# Patient Record
Sex: Female | Born: 1945
Health system: Southern US, Community
[De-identification: ages and names within clinical notes are randomized; demographics above are authoritative.]

## PROBLEM LIST (undated history)

## (undated) DIAGNOSIS — Z9109 Other allergy status, other than to drugs and biological substances: Secondary | ICD-10-CM

## (undated) DIAGNOSIS — K602 Anal fissure, unspecified: Secondary | ICD-10-CM

## (undated) DIAGNOSIS — M4850XA Collapsed vertebra, not elsewhere classified, site unspecified, initial encounter for fracture: Secondary | ICD-10-CM

## (undated) DIAGNOSIS — T7840XA Allergy, unspecified, initial encounter: Secondary | ICD-10-CM

## (undated) DIAGNOSIS — K5790 Diverticulosis of intestine, part unspecified, without perforation or abscess without bleeding: Secondary | ICD-10-CM

## (undated) DIAGNOSIS — K589 Irritable bowel syndrome without diarrhea: Secondary | ICD-10-CM

## (undated) DIAGNOSIS — C4490 Unspecified malignant neoplasm of skin, unspecified: Secondary | ICD-10-CM

## (undated) DIAGNOSIS — F32A Depression, unspecified: Secondary | ICD-10-CM

## (undated) DIAGNOSIS — N840 Polyp of corpus uteri: Secondary | ICD-10-CM

## (undated) DIAGNOSIS — F329 Major depressive disorder, single episode, unspecified: Secondary | ICD-10-CM

## (undated) HISTORY — PX: SKIN CANCER EXCISION: SHX779

## (undated) HISTORY — DX: Major depressive disorder, single episode, unspecified: F32.9

## (undated) HISTORY — DX: Polyp of corpus uteri: N84.0

## (undated) HISTORY — PX: HYSTEROSCOPY: SHX211

## (undated) HISTORY — PX: DILATION AND CURETTAGE OF UTERUS: SHX78

## (undated) HISTORY — PX: HEMORRHOID SURGERY: SHX153

## (undated) HISTORY — DX: Allergy, unspecified, initial encounter: T78.40XA

## (undated) HISTORY — DX: Depression, unspecified: F32.A

## (undated) HISTORY — DX: Anal fissure, unspecified: K60.2

## (undated) HISTORY — DX: Collapsed vertebra, not elsewhere classified, site unspecified, initial encounter for fracture: M48.50XA

## (undated) HISTORY — DX: Other allergy status, other than to drugs and biological substances: Z91.09

## (undated) HISTORY — PX: OTHER SURGICAL HISTORY: SHX169

## (undated) HISTORY — PX: MOUTH SURGERY: SHX715

## (undated) HISTORY — DX: Diverticulosis of intestine, part unspecified, without perforation or abscess without bleeding: K57.90

## (undated) HISTORY — DX: Irritable bowel syndrome, unspecified: K58.9

## (undated) HISTORY — PX: BACK SURGERY: SHX140

## (undated) HISTORY — PX: FRACTURE SURGERY: SHX138

## (undated) HISTORY — DX: Unspecified malignant neoplasm of skin, unspecified: C44.90

---

## 1997-11-09 ENCOUNTER — Other Ambulatory Visit: Admission: RE | Admit: 1997-11-09 | Discharge: 1997-11-09 | Payer: Self-pay | Admitting: Obstetrics and Gynecology

## 1998-01-10 ENCOUNTER — Emergency Department (HOSPITAL_COMMUNITY): Admission: EM | Admit: 1998-01-10 | Discharge: 1998-01-10 | Payer: Self-pay | Admitting: Emergency Medicine

## 1998-06-28 ENCOUNTER — Ambulatory Visit (HOSPITAL_COMMUNITY): Admission: RE | Admit: 1998-06-28 | Discharge: 1998-06-28 | Payer: Self-pay | Admitting: Obstetrics and Gynecology

## 1998-11-17 ENCOUNTER — Other Ambulatory Visit: Admission: RE | Admit: 1998-11-17 | Discharge: 1998-11-17 | Payer: Self-pay | Admitting: Obstetrics and Gynecology

## 1999-03-30 ENCOUNTER — Other Ambulatory Visit: Admission: RE | Admit: 1999-03-30 | Discharge: 1999-03-30 | Payer: Self-pay | Admitting: Obstetrics and Gynecology

## 2000-03-29 ENCOUNTER — Other Ambulatory Visit: Admission: RE | Admit: 2000-03-29 | Discharge: 2000-03-29 | Payer: Self-pay | Admitting: Obstetrics and Gynecology

## 2000-06-14 ENCOUNTER — Ambulatory Visit (HOSPITAL_COMMUNITY): Admission: RE | Admit: 2000-06-14 | Discharge: 2000-06-14 | Payer: Self-pay | Admitting: Family Medicine

## 2000-08-22 ENCOUNTER — Encounter (INDEPENDENT_AMBULATORY_CARE_PROVIDER_SITE_OTHER): Payer: Self-pay

## 2000-08-22 ENCOUNTER — Other Ambulatory Visit: Admission: RE | Admit: 2000-08-22 | Discharge: 2000-08-22 | Payer: Self-pay | Admitting: Obstetrics and Gynecology

## 2001-01-11 ENCOUNTER — Encounter (INDEPENDENT_AMBULATORY_CARE_PROVIDER_SITE_OTHER): Payer: Self-pay | Admitting: Specialist

## 2001-01-11 ENCOUNTER — Other Ambulatory Visit: Admission: RE | Admit: 2001-01-11 | Discharge: 2001-01-11 | Payer: Self-pay | Admitting: Obstetrics and Gynecology

## 2001-02-02 ENCOUNTER — Inpatient Hospital Stay (HOSPITAL_COMMUNITY): Admission: AD | Admit: 2001-02-02 | Discharge: 2001-02-02 | Payer: Self-pay | Admitting: *Deleted

## 2001-06-03 ENCOUNTER — Other Ambulatory Visit: Admission: RE | Admit: 2001-06-03 | Discharge: 2001-06-03 | Payer: Self-pay | Admitting: Obstetrics and Gynecology

## 2001-07-12 ENCOUNTER — Encounter: Payer: Self-pay | Admitting: Gastroenterology

## 2001-11-04 ENCOUNTER — Encounter: Admission: RE | Admit: 2001-11-04 | Discharge: 2001-11-04 | Payer: Self-pay | Admitting: Family Medicine

## 2001-11-04 ENCOUNTER — Encounter: Payer: Self-pay | Admitting: Family Medicine

## 2002-09-23 ENCOUNTER — Other Ambulatory Visit: Admission: RE | Admit: 2002-09-23 | Discharge: 2002-09-23 | Payer: Self-pay | Admitting: Obstetrics and Gynecology

## 2003-12-14 ENCOUNTER — Other Ambulatory Visit: Admission: RE | Admit: 2003-12-14 | Discharge: 2003-12-14 | Payer: Self-pay | Admitting: Obstetrics and Gynecology

## 2004-12-16 ENCOUNTER — Other Ambulatory Visit: Admission: RE | Admit: 2004-12-16 | Discharge: 2004-12-16 | Payer: Self-pay | Admitting: Obstetrics and Gynecology

## 2006-01-05 ENCOUNTER — Other Ambulatory Visit: Admission: RE | Admit: 2006-01-05 | Discharge: 2006-01-05 | Payer: Self-pay | Admitting: Obstetrics and Gynecology

## 2007-04-02 ENCOUNTER — Other Ambulatory Visit: Admission: RE | Admit: 2007-04-02 | Discharge: 2007-04-02 | Payer: Self-pay | Admitting: Obstetrics and Gynecology

## 2008-02-11 ENCOUNTER — Telehealth: Payer: Self-pay | Admitting: Gastroenterology

## 2008-02-21 ENCOUNTER — Telehealth: Payer: Self-pay | Admitting: Gastroenterology

## 2008-02-28 ENCOUNTER — Ambulatory Visit: Payer: Self-pay | Admitting: Gastroenterology

## 2008-02-28 DIAGNOSIS — K589 Irritable bowel syndrome without diarrhea: Secondary | ICD-10-CM | POA: Insufficient documentation

## 2008-02-28 DIAGNOSIS — K649 Unspecified hemorrhoids: Secondary | ICD-10-CM

## 2008-02-28 DIAGNOSIS — F605 Obsessive-compulsive personality disorder: Secondary | ICD-10-CM

## 2008-02-28 DIAGNOSIS — J45909 Unspecified asthma, uncomplicated: Secondary | ICD-10-CM

## 2008-02-28 DIAGNOSIS — N3020 Other chronic cystitis without hematuria: Secondary | ICD-10-CM

## 2008-02-28 DIAGNOSIS — I10 Essential (primary) hypertension: Secondary | ICD-10-CM

## 2008-03-03 ENCOUNTER — Telehealth: Payer: Self-pay | Admitting: Gastroenterology

## 2008-04-03 ENCOUNTER — Telehealth: Payer: Self-pay | Admitting: Gastroenterology

## 2008-04-07 ENCOUNTER — Ambulatory Visit: Payer: Self-pay | Admitting: Gastroenterology

## 2008-04-07 DIAGNOSIS — K6289 Other specified diseases of anus and rectum: Secondary | ICD-10-CM

## 2008-04-29 ENCOUNTER — Ambulatory Visit: Payer: Self-pay | Admitting: Gynecology

## 2008-05-05 ENCOUNTER — Ambulatory Visit: Payer: Self-pay | Admitting: Obstetrics and Gynecology

## 2008-05-26 ENCOUNTER — Ambulatory Visit: Payer: Self-pay | Admitting: Gastroenterology

## 2008-05-26 DIAGNOSIS — K602 Anal fissure, unspecified: Secondary | ICD-10-CM | POA: Insufficient documentation

## 2008-08-03 ENCOUNTER — Ambulatory Visit: Payer: Self-pay | Admitting: Obstetrics and Gynecology

## 2008-08-03 ENCOUNTER — Encounter: Payer: Self-pay | Admitting: Obstetrics and Gynecology

## 2008-08-03 ENCOUNTER — Other Ambulatory Visit: Admission: RE | Admit: 2008-08-03 | Discharge: 2008-08-03 | Payer: Self-pay | Admitting: Obstetrics and Gynecology

## 2008-08-25 ENCOUNTER — Ambulatory Visit: Payer: Self-pay | Admitting: Gastroenterology

## 2008-08-27 ENCOUNTER — Encounter: Payer: Self-pay | Admitting: Gastroenterology

## 2008-08-31 ENCOUNTER — Telehealth: Payer: Self-pay | Admitting: Gastroenterology

## 2008-09-02 ENCOUNTER — Telehealth: Payer: Self-pay | Admitting: Gastroenterology

## 2008-09-07 ENCOUNTER — Ambulatory Visit: Payer: Self-pay | Admitting: Gastroenterology

## 2008-09-07 ENCOUNTER — Encounter: Payer: Self-pay | Admitting: Gastroenterology

## 2008-09-08 ENCOUNTER — Encounter: Payer: Self-pay | Admitting: Gastroenterology

## 2008-09-08 ENCOUNTER — Telehealth: Payer: Self-pay | Admitting: Gastroenterology

## 2008-09-09 ENCOUNTER — Ambulatory Visit: Payer: Self-pay | Admitting: Gastroenterology

## 2008-09-09 LAB — CONVERTED CEMR LAB: Tissue Transglutaminase Ab, IgA: 0.1 units (ref <7)

## 2008-09-14 ENCOUNTER — Telehealth: Payer: Self-pay | Admitting: Gastroenterology

## 2008-10-27 ENCOUNTER — Ambulatory Visit: Payer: Self-pay | Admitting: Obstetrics and Gynecology

## 2009-02-04 ENCOUNTER — Ambulatory Visit: Payer: Self-pay | Admitting: Women's Health

## 2009-02-25 ENCOUNTER — Ambulatory Visit (HOSPITAL_BASED_OUTPATIENT_CLINIC_OR_DEPARTMENT_OTHER): Admission: RE | Admit: 2009-02-25 | Discharge: 2009-02-25 | Payer: Self-pay | Admitting: Surgery

## 2009-02-25 ENCOUNTER — Encounter (INDEPENDENT_AMBULATORY_CARE_PROVIDER_SITE_OTHER): Payer: Self-pay | Admitting: Surgery

## 2009-06-16 ENCOUNTER — Encounter: Admission: RE | Admit: 2009-06-16 | Discharge: 2009-06-16 | Payer: Self-pay | Admitting: Family Medicine

## 2009-06-17 ENCOUNTER — Encounter: Payer: Self-pay | Admitting: Cardiovascular Disease

## 2009-06-17 ENCOUNTER — Ambulatory Visit: Payer: Self-pay

## 2009-06-17 ENCOUNTER — Ambulatory Visit: Payer: Self-pay | Admitting: Internal Medicine

## 2009-06-17 ENCOUNTER — Ambulatory Visit: Payer: Self-pay | Admitting: Cardiovascular Disease

## 2009-06-17 ENCOUNTER — Ambulatory Visit (HOSPITAL_COMMUNITY): Admission: RE | Admit: 2009-06-17 | Discharge: 2009-06-17 | Payer: Self-pay | Admitting: Cardiovascular Disease

## 2009-06-17 DIAGNOSIS — R000 Tachycardia, unspecified: Secondary | ICD-10-CM

## 2009-06-21 ENCOUNTER — Telehealth: Payer: Self-pay | Admitting: Cardiovascular Disease

## 2009-06-21 ENCOUNTER — Telehealth (INDEPENDENT_AMBULATORY_CARE_PROVIDER_SITE_OTHER): Payer: Self-pay | Admitting: *Deleted

## 2009-06-23 ENCOUNTER — Telehealth: Payer: Self-pay | Admitting: Cardiovascular Disease

## 2009-07-19 ENCOUNTER — Encounter (INDEPENDENT_AMBULATORY_CARE_PROVIDER_SITE_OTHER): Payer: Self-pay | Admitting: Orthopedic Surgery

## 2009-07-19 ENCOUNTER — Inpatient Hospital Stay (HOSPITAL_COMMUNITY): Admission: RE | Admit: 2009-07-19 | Discharge: 2009-07-19 | Payer: Self-pay | Admitting: *Deleted

## 2009-07-28 ENCOUNTER — Ambulatory Visit: Payer: Self-pay | Admitting: Cardiovascular Disease

## 2009-10-05 ENCOUNTER — Other Ambulatory Visit: Admission: RE | Admit: 2009-10-05 | Discharge: 2009-10-05 | Payer: Self-pay | Admitting: Obstetrics and Gynecology

## 2009-10-05 ENCOUNTER — Ambulatory Visit: Payer: Self-pay | Admitting: Obstetrics and Gynecology

## 2009-11-16 ENCOUNTER — Ambulatory Visit: Payer: Self-pay | Admitting: Obstetrics and Gynecology

## 2009-11-24 ENCOUNTER — Ambulatory Visit: Payer: Self-pay | Admitting: Obstetrics and Gynecology

## 2010-02-28 ENCOUNTER — Ambulatory Visit: Payer: Self-pay | Admitting: Obstetrics and Gynecology

## 2010-08-09 NOTE — Assessment & Plan Note (Signed)
Summary: per check out/sf   Primary Provider:  Hulan Saas, MD   History of Present Illness: Ruth Walker is seen today at the request of [facility].  She has a severe anxiety disorder.  She fell recently needs a kyphoplasty.  She had significant tachycardia on her ECG and was referred for preop clearance  She was not anemic and they did draw a TSH althiought the notes did not indicate a final result.  She indicates that she always "freaks" out when she sees the doctor.  She denies SSCP, palpitations, syncope, diaphoresis.  There is no history of DCM or congenital heart issues.  She denies drug and alcohol use.  Her echo was reviewed and was normal.  I reviewed her holter monitor and it was also benign dated 06/17/2009.  Her heart rate had nomrla circadian variation.  Low was 59, high 101 and average Hr 74 bpm.  this does indicate that her tachycardia is physiologic and likely related to anxiety and not underlying pathology.  She is cleared for any back surgery and does not need any cardiac testing.    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 One Tablet By Mouth Every Day 2)  Hydrochlorothiazide 25 Mg Tabs (Hydrochlorothiazide) .Marland Kitchen.. 1 Tablet By Mouth Every Day 3)  Miralax  Powd (Polyethylene Glycol 3350) .... Mix 17grams in 8 Oz of Water At Bedtime 4)  Benefiber Drink Mix  Powd (Wheat Dextrin) .... 2 Teaspoons in The Morning 5)  Actonel  (Risedronate Sodium) .... Monthly 6)  Calcium 1200 1200-1000 Mg-Unit Chew (Calcium Carbonate-Vit  D-Min) .Marland Kitchen.. 1 Tab By Mouth Once Daily 7)  Vitamin D (Ergocalciferol) 50000 Unit Caps (Ergocalciferol) .... 2 Times A Month 8)  Restasis 0.05 % Emul (Cyclosporine) .... Daily 9)  Trimethoprim 100 Mg Tabs (Trimethoprim) .Marland Kitchen.. 1 Tab By Mouth Once Daily 10)  Hydrocodone-Acetaminophen 5-500 Mg Tabs (Hydrocodone-Acetaminophen) .Marland Kitchen.. 1 Tab By Mouth At Bedtime 11)  Tylenol Extra Strength 500 Mg Tabs (Acetaminophen) .... As Needed 12)  Zithromax 1 Gm Pack (Azithromycin) .... As Directed 13)  Metoprolol Tartrate 25 Mg Tabs (Metoprolol Tartrate) .... Take One Tablet By Mouth Twice A Day 14)  Aspirin 81 Mg Tbec (Aspirin) .... Take One Tablet By Mouth Daily 15)  Allergy Shot .... As Directed  Allergies (verified): 1)  ! Sulfa 2)  ! * Sellf 3)  ! Keflex  Past History:  Past Medical History: Last updated: 02/28/2008 Current Problems:  HEMORRHOIDS (ICD-455.6) IRRITABLE BOWEL SYNDROME (ICD-564.1)  Past Surgical History: Last updated: 02/28/2008 C-Section hemorrhiod surgery  Family History: Last updated: 02/28/2008 Family History of Diabetes: mother No FH of Colon Cancer:  Social History: Last updated: 04/07/2008 Occupation: retired Patient has never smoked.  Alcohol Use - no Illicit Drug Use - no Patient gets regular exercise.  Review of Systems       Denies fever, malais, weight loss, blurry vision, decreased visual acuity, cough, sputum, SOB, hemoptysis, pleuritic pain, palpitaitons, heartburn, abdominal pain, melena, lower extremity edema, claudication, or rash.   Vital Signs:  Patient profile:   65 year old female Height:  67 inches Weight:      129 pounds BMI:     20.28 Pulse rate:   77 / minute Resp:     12 per minute BP sitting:   126 / 74  (left arm)  Vitals Entered By: Kem Parkinson (July 28, 2009 2:04 PM)  Physical Exam  General:  Affect appropriate Healthy:  appears stated age HEENT: normal Neck supple with no adenopathy JVP normal no bruits no  thyromegaly Lungs clear with no wheezing and good diaphragmatic motion Heart:  S1/S2 no murmur,rub, gallop or click PMI normal Abdomen: benighn, BS positve, no tenderness, no AAA no bruit.  No HSM or HJR Distal pulses intact with no bruits No edema Neuro non-focal Skin warm and dry Bilateral scars on back of neck form recent surgery   Impression & Recommendations:  Problem # 1:  PREOPERATIVE EXAMINATION (ICD-V72.84) No complications from surgery.  Would d/c norvasc and continue BB as this helps with the anxiety and its  smoatic manifestations  Problem # 2:  TACHYCARDIA (ICD-785.0) Reactive from anxiety.  Holter with normal variation and echo with normal LV and RV  Problem # 3:  HYPERTENSION (ICD-401.9) Well controlled.  Don't think she needs 3 drug Rx.  Would D/C norvasc.  She will discuss with Dr Faustino Congress Her updated medication list for this problem includes:    Norvasc 5 Mg Tabs (Amlodipine besylate) ..... One tablet by mouth every day    Hydrochlorothiazide 25 Mg Tabs (Hydrochlorothiazide) .Marland Kitchen... 1 tablet by mouth every day    Metoprolol Tartrate 25 Mg Tabs (Metoprolol tartrate) .Marland Kitchen... Take one tablet by mouth twice a day    Aspirin 81 Mg Tbec (Aspirin) .Marland Kitchen... Take one tablet by mouth daily  Patient Instructions: 1)  Your physician recommends that you schedule a follow-up appointment as needed 2)  Your physician recommends that you continue on your current medications as directed. Please refer to the Current Medication list given to you today. 3)  You have been diagnosed with palpitations. Palpitations are described as a gradual or sudden awareness of the beating of your heart. It may last seconds, minutes, hours, or days and may be caused by the heart beating slower, faster, more strongly, or more irregularly than normal. They are very common and usually not dangerous. Please see the handout/brochure given to you today for more information.

## 2010-08-09 NOTE — Miscellaneous (Signed)
Summary: [facility] Previsit/prep  Clinical Lists Changes  Medications: Added new medication of MOVIPREP 100 GM  SOLR (PEG-KCL-NACL-NASULF-NA ASC-C) As per prep instructions. - Signed Rx of MOVIPREP 100 GM  SOLR (PEG-KCL-NACL-NASULF-NA ASC-C) As per prep instructions.;  #1 x 0;  Signed;  Entered by: Wyona Almas RN;  Authorized by: Mardella Layman MD [facility];  Method used: Electronically to [facility] [phone]*, [address] [place], [place], [place]  [address], Ph: [phone] or [phone], Fax: [phone] Allergies: Added new allergy or adverse reaction of KEFLEX    Prescriptions: MOVIPREP 100 GM  SOLR (PEG-KCL-NACL-NASULF-NA ASC-C) As per prep instructions.  #1 x 0   Entered by:   Wyona Almas RN   Authorized by:   Mardella Layman MD [facility]   Signed by:   Wyona Almas RN on 08/25/2008   Method used:   Electronically to        [facility] [phone]* (retail)       [address] [place], [place]  [address]       Ph: [phone] or [phone]       Fax: [phone]   RxID:   [id number]

## 2010-09-25 LAB — URINALYSIS, ROUTINE W REFLEX MICROSCOPIC
Bilirubin Urine: NEGATIVE
Nitrite: NEGATIVE
Protein, ur: NEGATIVE mg/dL
Specific Gravity, Urine: 1.011 (ref 1.005–1.030)
Urobilinogen, UA: 0.2 mg/dL (ref 0.0–1.0)

## 2010-09-25 LAB — CBC
HCT: 42.5 % (ref 36.0–46.0)
MCV: 91.5 fL (ref 78.0–100.0)
Platelets: 259 10*3/uL (ref 150–400)
RDW: 12.3 % (ref 11.5–15.5)

## 2010-09-25 LAB — COMPREHENSIVE METABOLIC PANEL
Albumin: 4.2 g/dL (ref 3.5–5.2)
BUN: 11 mg/dL (ref 6–23)
Creatinine, Ser: 0.76 mg/dL (ref 0.4–1.2)
Potassium: 3.5 mEq/L (ref 3.5–5.1)
Total Protein: 6.9 g/dL (ref 6.0–8.3)

## 2010-09-25 LAB — TYPE AND SCREEN: Antibody Screen: NEGATIVE

## 2010-09-25 LAB — DIFFERENTIAL
Lymphocytes Relative: 15 % (ref 12–46)
Monocytes Absolute: 0.6 10*3/uL (ref 0.1–1.0)
Monocytes Relative: 6 % (ref 3–12)
Neutro Abs: 7.7 10*3/uL (ref 1.7–7.7)

## 2010-09-25 LAB — APTT: aPTT: 27 seconds (ref 24–37)

## 2010-09-25 LAB — ABO/RH: ABO/RH(D): A POS

## 2010-09-25 LAB — URINE MICROSCOPIC-ADD ON

## 2010-10-10 ENCOUNTER — Encounter: Payer: Self-pay | Admitting: Obstetrics and Gynecology

## 2010-10-15 LAB — BASIC METABOLIC PANEL
BUN: 11 mg/dL (ref 6–23)
CO2: 30 mEq/L (ref 19–32)
Chloride: 94 mEq/L — ABNORMAL LOW (ref 96–112)
Glucose, Bld: 120 mg/dL — ABNORMAL HIGH (ref 70–99)
Potassium: 3.8 mEq/L (ref 3.5–5.1)
Sodium: 133 mEq/L — ABNORMAL LOW (ref 135–145)

## 2010-10-25 ENCOUNTER — Encounter: Payer: Self-pay | Admitting: Obstetrics and Gynecology

## 2010-10-28 ENCOUNTER — Telehealth: Payer: Self-pay | Admitting: Gastroenterology

## 2010-10-28 NOTE — Telephone Encounter (Signed)
i WILL SEE HER IF NEEDED...FOLLOW 1 CARE GUIDELINES.Marland KitchenMarland Kitchen

## 2010-10-28 NOTE — Telephone Encounter (Signed)
Notified pt Dr Jarold Motto would like for her to follow Dr Doolittle's instructions; if she needs any further assistance we can try to see her next week. Pt stated understanding.  She stated Dr Doolittle's primary nurse called to inform her not to take the Benefiber or Miralax until the 1st day free of diarrhea.

## 2010-10-28 NOTE — Telephone Encounter (Signed)
Pt had no choice this am but to see Dr Theotis Barrio. Pt stated she had a rotovirus, which is now over, but he is concerned with her IBS. Dr Merla Riches had her hold the Benefiber and the Miralax and ordered Librax x 5 days.  Pt reports her stools are pure water with terrible stomach pains. Dr Merla Riches instructed her to drink only stating she wasn't dehydrated; she can have soup, jello, applesauce, etc. I informed pt Benefiber is off the market temporarily; she wants to know what to take? ( states she can't take Metamucil) Informed pt she can take FiberCon, and Metamucil Clear and Natural or Citracel- but she wants your opinion. 1) Should we see her next week and can she follow the BRAT diet as well? 2) When should she start back on the Miralax/ Benefiber substitute- Dr Merla Riches said to hold it until she came off the Librax?

## 2010-10-28 NOTE — Telephone Encounter (Signed)
Lmom for pt to call back. 

## 2010-10-31 ENCOUNTER — Telehealth: Payer: Self-pay | Admitting: Gastroenterology

## 2010-10-31 NOTE — Telephone Encounter (Signed)
Pt reports she's much better on the Librax and by watching her diet. Pt will have Librax through tomorrow and wants to know if it's ok to start her Benefiber/Miralax regime. She did take Imodium twice since 10/28/10. Advised pt to advance her diet slowly as tolerated and start her Benefiber/Miralax today per Dr Doolittle's instructions. 10/28/10 Dr Merla Riches advised her to start back the day after her diarrhea ended. Offered pt an appt with Dr Jarold Motto tomorrow, but she will wait until later in the week if not "progressing".

## 2010-11-03 ENCOUNTER — Other Ambulatory Visit: Payer: Self-pay | Admitting: Emergency Medicine

## 2010-11-03 ENCOUNTER — Ambulatory Visit (HOSPITAL_COMMUNITY)
Admission: RE | Admit: 2010-11-03 | Discharge: 2010-11-03 | Disposition: A | Discharge status: Home / Self Care | Payer: BC Managed Care – PPO | Source: Ambulatory Visit | Attending: Emergency Medicine | Admitting: Emergency Medicine

## 2010-11-03 ENCOUNTER — Telehealth: Payer: Self-pay | Admitting: Gastroenterology

## 2010-11-03 DIAGNOSIS — D72829 Elevated white blood cell count, unspecified: Secondary | ICD-10-CM | POA: Insufficient documentation

## 2010-11-03 DIAGNOSIS — R1084 Generalized abdominal pain: Secondary | ICD-10-CM

## 2010-11-03 DIAGNOSIS — N289 Disorder of kidney and ureter, unspecified: Secondary | ICD-10-CM | POA: Insufficient documentation

## 2010-11-03 DIAGNOSIS — K8020 Calculus of gallbladder without cholecystitis without obstruction: Secondary | ICD-10-CM | POA: Insufficient documentation

## 2010-11-03 DIAGNOSIS — M47814 Spondylosis without myelopathy or radiculopathy, thoracic region: Secondary | ICD-10-CM | POA: Insufficient documentation

## 2010-11-03 DIAGNOSIS — N200 Calculus of kidney: Secondary | ICD-10-CM | POA: Insufficient documentation

## 2010-11-03 DIAGNOSIS — R109 Unspecified abdominal pain: Secondary | ICD-10-CM | POA: Insufficient documentation

## 2010-11-03 LAB — CREATININE, SERUM
Creatinine, Ser: 0.8 mg/dL (ref 0.4–1.2)
GFR calc Af Amer: >60 mL/min (ref >60)
GFR calc non Af Amer: >60 mL/min (ref >60)

## 2010-11-03 NOTE — Telephone Encounter (Signed)
Pt called back and since she hadn't heard from [place] , she will go to [facility]. I did schedule her to see Amy in the am if she needs it. Is it still ok to order the [facility] and should she start Imodium? Thanks

## 2010-11-03 NOTE — Telephone Encounter (Signed)
Pt had Rotovirus last week, saw Dr Merla Riches who ordered Librax x 5 says, restart Benefiber and Miralax after 1st day diarrhea free. Pt was doing well; Monday and Tuesday took last of Librax and restarted Bene/Miralax. Last pm ate at [employer] , probably ate the wrong thing, this am terrible cramping and 4th stool this am was water. Pt would like to be seen- Amy is full and you are as well. Amy has plenty tomorrow am. Do you want to work her in today or order more Librax and schedule her for tomorrow?

## 2010-11-03 NOTE — Telephone Encounter (Signed)
fine

## 2010-11-04 ENCOUNTER — Telehealth: Payer: Self-pay | Admitting: Gastroenterology

## 2010-11-04 ENCOUNTER — Encounter: Payer: Self-pay | Admitting: Physician Assistant

## 2010-11-04 ENCOUNTER — Ambulatory Visit (INDEPENDENT_AMBULATORY_CARE_PROVIDER_SITE_OTHER): Payer: BC Managed Care – PPO | Admitting: Physician Assistant

## 2010-11-04 VITALS — BP 126/74 | HR 68 | Ht 67.0 in | Wt 127.0 lb

## 2010-11-04 DIAGNOSIS — K589 Irritable bowel syndrome without diarrhea: Secondary | ICD-10-CM

## 2010-11-04 DIAGNOSIS — K8020 Calculus of gallbladder without cholecystitis without obstruction: Secondary | ICD-10-CM | POA: Insufficient documentation

## 2010-11-04 DIAGNOSIS — K5730 Diverticulosis of large intestine without perforation or abscess without bleeding: Secondary | ICD-10-CM

## 2010-11-04 DIAGNOSIS — K5790 Diverticulosis of intestine, part unspecified, without perforation or abscess without bleeding: Secondary | ICD-10-CM | POA: Insufficient documentation

## 2010-11-04 DIAGNOSIS — R197 Diarrhea, unspecified: Secondary | ICD-10-CM

## 2010-11-04 MED ORDER — CILIDINIUM-CHLORDIAZEPOXIDE 2.5-5 MG PO CAPS: 1.0000 | ORAL_CAPSULE | Freq: Three times a day (TID) | ORAL | Status: DC | PRN

## 2010-11-04 MED ORDER — ALIGN 4 MG PO CAPS: 1.0000 | ORAL_CAPSULE | Freq: Every day | ORAL | Status: DC

## 2010-11-04 NOTE — Telephone Encounter (Signed)
Pts primary care doc gave her Librax yesterday. Pt will keep appt with Mike Gip PA today.

## 2010-11-04 NOTE — Progress Notes (Signed)
Subjective:    Patient ID: Ruth Walker, female    DOB: 1946-01-01, 65 y.o.   MRN: [id number]  HPI Ruth Walker is a 65 year old white female known to Dr. Jarold Motto with history of IBS/constipation predominant. She has diverticulosis and cholelithiasis. Unfortunately she was exposed to a viral illness approximately one week ago from her 10-year-old grandson. Both she and her husband became ill within 24 hours with nausea vomiting abdominal cramping and diarrhea. She had fairly profuse diarrhea for about 48 at worst. She had called here but was unable to be seen and was then seen in urgent care by Dr. Merla Riches who felt that she likely had a rotavirus. He asked her to stop her MiraLax and Benefiber and gave her a prescription for Librax. Librax did seem to help she also took one Imodium but then became constipated. She has been struggling with an anal fissure and was worried that she did not want to strain. Over the next few days she continued to have some diarrhea and cramping after she had finished the course of Librax. She went back to urgent care yesterday because she was unable to be seen by Dr. Jarold Motto and a CT scan of the abdomen and pelvis was ordered. This shows no clear acute abdominal or pelvic process. She does have gallstones without evidence of cholecystitis and a small nonobstructing right renal calculus. She was given another prescription for Librax. Last evening she had diarrhea and cramping. Today she feels better, continuing to have mild cramping but feels overall the diarrhea is improving. She is no longer nauseated and has been able to eat bland foods. She is anxious and quite concerned about what regimen she should be on and whether or not she is going to continue to have chronic problems with diarrhea.    Review of Systems  Constitutional: Positive for fever and appetite change.  HENT: Negative.   Eyes: Negative.   Respiratory: Negative.   Cardiovascular: Negative.     Gastrointestinal: Positive for nausea, abdominal pain and diarrhea.  Genitourinary: Negative.   Musculoskeletal: Negative.   Neurological: Negative.   Hematological: Negative.   Psychiatric/Behavioral: Negative.        Objective:   Physical Exam Well-developed thin anxious white female in no acute distress pleasant Alert and oriented x3 HEENT nontraumatic normocephalic EOMI PERRLA sclera anicteric  Neck; supple no JVD  Cardiovascular; regular rate and rhythm with S1-S2 no murmur rub or gallop  Pulmonary; clear bilaterally  Abdomen; soft nondistended bowel sounds active, she is mildly tender bilaterally in the lower quadrants no guarding or rebound no palpable mass or hepatosplenomegaly   Rectal exam; no external lesions noted with digital exam she appears to have a fissure on the left without evidence of bleeding. Skin warm and dry benign no lesions  Psych; talkative mood and affect normal and appropriate .        Assessment & Plan:  #55 65 year old femal with long term IBS/constipation predominant, now with an acute infectious gastroenteritis resolving and probable post infectious exacerbation of her IBS with diarrhea.  Plan; Reassurance Bland low residue diet with gradual advancement Continue Librax 1 daily in the morning over the next week, she may take 3 times daily as needed for persistent cramping, then tried to wean off Librax Hold MiraLax for now Resume Benefiber daily Add a line one daily in a.m. x30 days Patient was advised that it may take several weeks for her current symptoms to completely  Resolve. Return office visit with Dr. Jarold Motto  on an as-needed basis.

## 2010-11-04 NOTE — Telephone Encounter (Signed)
Pt has an appt to see Mike Gip PA today at 11am.

## 2010-11-04 NOTE — Patient Instructions (Addendum)
We have given you samples of Align, a probiotic.  Take 1 capsule daily x 4 weeks. Hold the Miralax for now. Restart the Benefiber daily.  We gave you another refill on the Librax. Take 3 times daily as needed for cramping and spasms. Try to wean off the Librax as cramping improves. It may take 2-3 weeks to get back to your normal.

## 2010-11-08 ENCOUNTER — Encounter: Payer: Self-pay | Admitting: Gastroenterology

## 2010-11-09 NOTE — Progress Notes (Signed)
I AGREE..DRP 

## 2010-11-11 ENCOUNTER — Telehealth: Payer: Self-pay | Admitting: Physician Assistant

## 2010-11-14 NOTE — Telephone Encounter (Signed)
Spoke to pt and asked her how the weekend went with her symptoms. She said when she saw Amy last week, Amy did say it could take several  weeks to see some improvement. She is trying to wean off the Librax and some days she takes 1 and some days she needs to take it 2 times a day.  She said since she has had the thrombosed hemorrhoids, she is nervous when she needs to have a BM.  It does hurt when she has her first BM of the day.  She isnt taking the Miralax and worries about constipation.  I told her if she thinks she is in a pattern of firm stools she could use Colace or any form of stool softner.  She was prescribed Clonazepam and hasn't used it.  I told her to use it if she needs to.  Amy said she can restart the Miralax once daily for now. She can take the Librax once daily or twice if she needs it.  She can call us and give [place] an update the end of the week.

## 2010-11-15 ENCOUNTER — Encounter: Payer: Self-pay | Admitting: Obstetrics and Gynecology

## 2010-11-15 ENCOUNTER — Telehealth: Payer: Self-pay | Admitting: Physician Assistant

## 2010-11-15 MED ORDER — CILIDINIUM-CHLORDIAZEPOXIDE 2.5-5 MG PO CAPS: 1.0000 | ORAL_CAPSULE | Freq: Three times a day (TID) | ORAL | Status: DC | PRN

## 2010-11-15 NOTE — Telephone Encounter (Signed)
Called pt back and she thanked me for calling her.  She wanted me to know she had 3 slices of pizza last evening and today she is cramping.  I asked if she took the Librax yet today and she had not.  I told her to take one this morning.  She said she has not had any abdominal cramping since we talked on Friday the 4th until today.  She has had some loose stools today but normal stools without pain since we spoke. She thinks she is not going to take the Miralax the rest of this week unless she has firm stools again.  I told her she is welcome to call us the end of the week with another progress report.  She asked me to refill the Librax that Dr. Cleta Alberts prescribed. Amy said last week she would refill it if the pt  Needs it. I sent a refill to the pharmacy, [facility].

## 2010-11-17 ENCOUNTER — Other Ambulatory Visit (HOSPITAL_COMMUNITY)
Admission: RE | Admit: 2010-11-17 | Discharge: 2010-11-17 | Disposition: A | Discharge status: Home / Self Care | Payer: BC Managed Care – PPO | Source: Ambulatory Visit | Attending: Obstetrics and Gynecology | Admitting: Obstetrics and Gynecology

## 2010-11-17 ENCOUNTER — Other Ambulatory Visit: Payer: Self-pay | Admitting: Obstetrics and Gynecology

## 2010-11-17 ENCOUNTER — Encounter (INDEPENDENT_AMBULATORY_CARE_PROVIDER_SITE_OTHER): Payer: BC Managed Care – PPO | Admitting: Obstetrics and Gynecology

## 2010-11-17 DIAGNOSIS — Z833 Family history of diabetes mellitus: Secondary | ICD-10-CM

## 2010-11-17 DIAGNOSIS — Z124 Encounter for screening for malignant neoplasm of cervix: Secondary | ICD-10-CM | POA: Insufficient documentation

## 2010-11-17 DIAGNOSIS — Z01419 Encounter for gynecological examination (general) (routine) without abnormal findings: Secondary | ICD-10-CM

## 2010-11-18 ENCOUNTER — Telehealth: Payer: Self-pay | Admitting: Physician Assistant

## 2010-11-18 NOTE — Telephone Encounter (Signed)
Patient calling to let us know she is doing better. She saw Dr. Luisa Hart last week and he told her everything looks good. States the first stool in the AM was soft and did hurt a little bit but the second stool was soft and did not hurt. She had no cramps yesterday. She will do the Benefiber and will continue to "wait and see" with the Miralax.

## 2010-11-22 NOTE — Op Note (Signed)
Ruth Walker, Ruth Walker               ACCOUNT NO.:  [id number]   MEDICAL RECORD NO.:  [id number]          PATIENT TYPE:  AMB   LOCATION:  DSC                          FACILITY:  [facility]   PHYSICIAN:  Thomas A. Cornett, M.D.DATE OF BIRTH:  10/06/1945   DATE OF PROCEDURE:  02/25/2009  DATE OF DISCHARGE:                               OPERATIVE REPORT   PREOPERATIVE DIAGNOSIS:  Right posterior calf 3.0 x 2.0 cm basal cell  carcinoma.   POSTOPERATIVE DIAGNOSIS:  Right posterior calf 3.0 x 2.0 cm basal cell  carcinoma.   PROCEDURE:  Excision of 3.5 cm x 2.5 cm right calf basal cell carcinoma.   SURGEON:  Maisie Fus A. Cornett, MD   ANESTHESIA:  MAC with 0.25% Sensorcaine local.   ESTIMATED BLOOD LOSS:  10 mL.   SPECIMEN:  Right posterior calf skin lesion to Pathology.   INDICATIONS FOR PROCEDURE:  The patient is a 65 year old female who has  developed a skin lesion on the posterior aspect of her calf.  Core  biopsy proves to be a basal cell carcinoma and she presents for excision  of this in the operating room due to its location.   DESCRIPTION OF PROCEDURE:  The patient was brought to the operating  room.  She was placed in her left side down, elevating her right calf,  the lesion was in the posterior right calf.  This area was prepped and  draped in sterile fashion.  Marking pen was used and ellipse was roughly  a 5-mm margin was drawn around the loose skin lesion.  This was excised  using the scalpel down to nice clean subcutaneous fatty tissue.  I then  mobilized the skin edges using both cautery as well as the scalpel to  help bring the skin back toward the midline.  I then closed the wound  with a deep layer of 3-0 Vicryl, 4-0 and 3-0 Monocryl in a subcuticular  fashion.  Dermabond was applied as dressing.  There was no evidence of  any active bleeding.  All final counts of sponge, needle, and  instruments were found to be correct at this portion of the case.  The  patient was then  taken to recovery room in satisfactory condition.      Thomas A. Cornett, M.D.  Electronically Signed     TAC/MEDQ  D:  02/25/2009  T:  02/26/2009  Job:  [id number]   cc:   Elvina Sidle, M.D.

## 2010-11-23 ENCOUNTER — Telehealth: Payer: Self-pay | Admitting: Physician Assistant

## 2010-11-23 NOTE — Telephone Encounter (Signed)
Left message with husband we are waiting for more Align . I told him we do have another brand and she can call me .  I can put some of the other brand (Restora) at the front desk.  He said he would give her the message.

## 2010-12-06 ENCOUNTER — Telehealth: Payer: Self-pay | Admitting: Physician Assistant

## 2010-12-06 NOTE — Telephone Encounter (Signed)
Pt called in to say she lost medication, librax, 40 pills.  Amy Esterwood PA-C said it was okay to call in another prescription.  I called [facility] [phone] and there was one refill left and the pt was there and asked for refill. The pharmacy called the pt to let her know she can pick up this prescription.

## 2010-12-15 ENCOUNTER — Telehealth: Payer: Self-pay | Admitting: Physician Assistant

## 2010-12-16 NOTE — Telephone Encounter (Signed)
Called patient to advise I had put some samples of Align and a coupon up front. The patient is doing well and using a form of something like benefiber from the pharamcy. Gunnar Fusi had suggested that.

## 2010-12-20 ENCOUNTER — Encounter: Payer: Self-pay | Admitting: Physician Assistant

## 2010-12-20 ENCOUNTER — Other Ambulatory Visit (INDEPENDENT_AMBULATORY_CARE_PROVIDER_SITE_OTHER): Payer: Self-pay | Admitting: Surgery

## 2010-12-20 ENCOUNTER — Telehealth: Payer: Self-pay | Admitting: *Deleted

## 2010-12-20 ENCOUNTER — Ambulatory Visit (INDEPENDENT_AMBULATORY_CARE_PROVIDER_SITE_OTHER): Payer: BC Managed Care – PPO | Admitting: Physician Assistant

## 2010-12-20 VITALS — BP 148/88 | HR 80 | Ht 66.0 in | Wt 131.0 lb

## 2010-12-20 DIAGNOSIS — K625 Hemorrhage of anus and rectum: Secondary | ICD-10-CM

## 2010-12-20 LAB — CBC
HCT: 41.6 % (ref 36.0–46.0)
MCV: 93 fL (ref 78.0–100.0)
RBC: 4.48 MIL/uL (ref 3.87–5.11)
RDW: 11.8 % (ref 11.5–15.5)
WBC: 9.8 10*3/uL (ref 4.0–10.5)

## 2010-12-20 MED ORDER — PRAMOXINE-HC 1-2.5 % EX CREA: TOPICAL_CREAM | CUTANEOUS | Status: DC

## 2010-12-20 NOTE — Patient Instructions (Signed)
We will call you when we get results from Dr. Nadara Mustard office. We sent a prescription for the Analpram 2.5 % to your pharmacy. We made you an appointment to see Amy on 01-05-2011.

## 2010-12-20 NOTE — Progress Notes (Signed)
Reviewed and agree DB 

## 2010-12-20 NOTE — Progress Notes (Signed)
Reviewed and agree.

## 2010-12-20 NOTE — Progress Notes (Signed)
  Subjective:    Patient ID: Ruth Walker, female    DOB: 10/09/1945, 65 y.o.   MRN: [id number]  HPI Ruth Walker is a pleasant 65 year old white female known to Dr. Jarold Motto with history of IBS constipation predominant, also with history of diverticulosis cholelithiasis and recurrent problems with anal fissures. She last had colonoscopy in March of 2010 showed mild diverticulosis in the sigmoid colon random biopsies were done which were negative. There was  no rectal abnormality noted at the time of colonoscopy. She was last seen in April of 2012 at which time she had a viral gastroenteritis. She comes in today because of onset of rectal bleeding. She says she is pleased with her IBS symptoms which seemed to have settled down with Align. She has also been using a fiber supplement .Yesterday she said the she had some straining for bowel movement though she was not constipated and then passed a small amount of bright red blood. She had 2 more bowel movements later in the day without any evidence of bleeding. This morning with her first bowel movement she noted the a small amount of bright red blood around the bowel movement. But the second stool she saw a approximate quarter-sized amount of bright red blood in the commode. She was seen by Dr. Mignon Pine earlier today and apparently had anoscopy which was negative. I do not have his note available at this time. She also had a CBC done. Because of her concern about the bleeding it was advised that she come here for followup. She really denies any abdominal pain, but does admit to some mild internal and external rectal discomfort since yesterday. She is very anxious and afraid that she is hemorrhaging, and therefore afraid  to have another bowel movement.   Review of Systems  Constitutional: Negative.   HENT: Negative.   Eyes: Negative.   Respiratory: Negative.   Cardiovascular: Negative.   Gastrointestinal: Positive for anal bleeding.  Genitourinary: Negative.    Musculoskeletal: Negative.   Skin: Negative.   Neurological: Negative.   Hematological: Negative.   Psychiatric/Behavioral: Negative.        Objective:   Physical Exam Well-developed anxious talkative thin white female in no acute distress, pleasant, alert and oriented x3 HEENT; nontraumatic normocephalic EOMI PERRLA sclera anicteric  Cardiovascular; regular rate and rhythm with S1-S2 no murmur or gallop  Pulmonary; clear bilaterally  Abdomen; soft nontender nondistended bowel sounds active no palpable mass or hepatosplenomegaly  Rectal; exam No external lesions noted, scant stool in the rectal vault Brown heme positive on anoscopy she has mild inflammation and a very scant oozing of blood        Assessment & Plan:  #49 65 year old female with small volume hematochezia either secondary to a small inflamed internal hemorrhoid,  versus shallow tear.  Plan; Patient is reassured that there is no evidence of any significant bleeding and that she does not appear to be having a GI bleed. Bleeding is secondary to local anorectal irritation and should be self-limited. Obtain results of CBC done earlier today Add Analpram cream 2.5%, used 3 times daily x1 week then as needed. Followup in 2 weeks with Dr. Jarold Motto or myself  #2 IBS/constipation predominant-stable  #3 Chronic anxiety

## 2010-12-20 NOTE — Telephone Encounter (Signed)
Germaine from Dr Cornett's ofc called to report pt has rectal bleeding. Dr Luisa Hart checked for a fissure- negative, and did an anoscope. Wants pt to be seen. Appointment today with Mike Gip, PA at 1:30pm.

## 2010-12-21 ENCOUNTER — Telehealth: Payer: Self-pay | Admitting: *Deleted

## 2010-12-21 NOTE — Telephone Encounter (Signed)
Message copied by Derry Skill on Wed Dec 21, 2010 10:42 AM ------      Message from: Derry Skill      Created: Tue Dec 20, 2010  2:23 PM       Call Dr. Carylon Perches office and get CBC results. Pt saw him on Tues 6-12 and they sent her for labs.

## 2010-12-21 NOTE — Telephone Encounter (Signed)
Called pt's home and spoke to her husband.  She had stepped out and he told me I could give the lab resutls to him.  I did advise that her HGB was 15.0 and per Mike Gip PA that was normal.  The rest of the CBC was normal as well.  He thanked me for calling .

## 2010-12-26 ENCOUNTER — Telehealth: Payer: Self-pay | Admitting: Physician Assistant

## 2010-12-26 NOTE — Telephone Encounter (Signed)
Spoke with patient and she will continue Miralax until she sees Victor, [place] on 01/05/11

## 2010-12-26 NOTE — Telephone Encounter (Signed)
Left a message for patient to call me. 

## 2010-12-30 ENCOUNTER — Telehealth: Payer: Self-pay | Admitting: Physician Assistant

## 2011-01-02 ENCOUNTER — Telehealth: Payer: Self-pay | Admitting: Physician Assistant

## 2011-01-02 NOTE — Telephone Encounter (Signed)
Called the pt and she has appt with Mike Gip PA-C on 01-05-2011 @ 1:30 PM .  The pt just went through some trouble with her Mother falling.  She get nervous about things anyway and this has caused her nerves to be on edge.  She has had a few episodes of BM today with some blood but not a lot.  Amy did tell her that she may have times when she will have a BM and the hemorrhoids may cause bleeding.  The pt wanted me to let Amy know that she can only pay $25.00 on Thursday.  Amy had told her before that was okay.  The pt wants me to be sure to tell Amy.  The patient will most certainly be here on Thurs 01-05-2011 to see Amy.

## 2011-01-02 NOTE — Telephone Encounter (Signed)
I was not at work on Friday 6-22.  Spoke to pt on Mon 6-25.

## 2011-01-03 ENCOUNTER — Encounter (INDEPENDENT_AMBULATORY_CARE_PROVIDER_SITE_OTHER): Payer: Self-pay | Admitting: Surgery

## 2011-01-05 ENCOUNTER — Telehealth: Payer: Self-pay | Admitting: Physician Assistant

## 2011-01-05 ENCOUNTER — Ambulatory Visit (INDEPENDENT_AMBULATORY_CARE_PROVIDER_SITE_OTHER): Payer: BC Managed Care – PPO | Admitting: Physician Assistant

## 2011-01-05 ENCOUNTER — Encounter: Payer: Self-pay | Admitting: Physician Assistant

## 2011-01-05 VITALS — BP 104/78 | HR 64 | Ht 67.0 in | Wt 128.0 lb

## 2011-01-05 DIAGNOSIS — K625 Hemorrhage of anus and rectum: Secondary | ICD-10-CM

## 2011-01-05 MED ORDER — HYDROCORTISONE 2.5 % RE CREA: TOPICAL_CREAM | RECTAL | Status: DC

## 2011-01-05 NOTE — Telephone Encounter (Signed)
Spoke with Mike Gip, PA who saw pt this afternoon. Amy said pt is under a lot of stress and is anxious today. Amy did a Anoscopy today and saw no bleeding and a very, very tiny hemorrhoid that could have burst causing the bleeding pt is reporting. Pt may also be bleeding from irritation from the scope and procedure. Amy stated pt may take Sitz baths, give it time and Anusol HC suppositories can be used prn.

## 2011-01-05 NOTE — Patient Instructions (Signed)
We have sent refills for the rectal cream to your pharmacy. Stay on the Align, take 1 capsule daily.  Samples and coupon given. We have given you a Science writer for  [employer].

## 2011-01-05 NOTE — Telephone Encounter (Signed)
Notified pt of Amy's instructions and to give the bleeding time to resolve. Pt stated understanding and will do her Sitz baths and use the cream Amy ordered today. I offered to order Anusol HC suppositories, but she likes the cream better.

## 2011-01-05 NOTE — Progress Notes (Signed)
  Subjective:    Patient ID: Ruth Walker, female    DOB: April 01, 1946, 66 y.o.   MRN: [id number]  HPI Ruth Walker is a pleasant 65 year old white female known to Dr. Jarold Motto who was seen by myself approximately 2 weeks ago with small volume rectal bleeding which was felt secondary to local anal rectal irritation with scant oozing of blood on anoscopy no definite fissure seen. She was treated with sitz baths and Analpram 3 times daily. She is extremely anxious lady and is brought back in today for followup because she is worried about hemorrhaging. She says she was doing much better and had finished the Analpram. However she has been under a lot of stress over this past week as her elderly mother took a serious fall and fractured her right jaw as well as her wrist. She says any time she gets upset as she cannot relax seems to have anal spasms and then gets into bleeding problems again. She had one episode on Monday the 25th with a small amount of bright red blood mixed with a bowel movement and on the tissue. She says she has had minimal internal rectal soreness since then but has not seen any further blood.  On further discussion, she talks for about her stress level although with multiple different family stressors and financial stressors which she has a very hard time dealing with. She asks for advice.    Review of Systems  Constitutional: Negative.   HENT: Negative.   Eyes: Negative.   Respiratory: Negative.   Cardiovascular: Negative.   Gastrointestinal: Positive for anal bleeding and rectal pain.  Genitourinary: Negative.   Musculoskeletal: Negative.   Skin: Negative.   Neurological: Negative.   Hematological: Negative.   Psychiatric/Behavioral: Positive for sleep disturbance. The patient is nervous/anxious.        Objective:   Physical Exam Well-developed thin white female in no acute distress, anxious, talkative but pleasant HEENT; ; normocephalic EOMI PERRLA sclera anicteri c neck;  supple no JVD  Cardiovascular; regular rate and rhythm with S1-S2 no murmur or gallop  Pulmonary; clear bilaterally   Abdomen; soft nontender nondistended no mass or hepatosplenomegaly Rectal; very small external hemorrhoid., Stool; heme negative no fissure noted or other abnormality on anoscopy.        Assessment & Plan:  #73 65 year old female with recurrent very low-grade intermittent rectal bleeding, at this time likely secondary to very small external hemorrhoid.  Plan; the patient is up-to-date with colonoscopy, last: 2010 showing mild diverticulosis and otherwise negative. Restart Analpram 2.5% 3 times daily x1 week then as needed thereafter.  Follow up with Dr. Jarold Motto on an as-needed basis.   #2 Chronic anxiety disorder, and OCD. Recent exacerbation of anxiety Patient was provided with information on our behavioral health department and advised to seek counseling, or stress management with relaxation/ meditation etc.  #3 irritable bowel syndrome  #4 diverticulosis

## 2011-01-06 NOTE — Progress Notes (Signed)
I agree with Ms. Esterwood's assessment and plan. 

## 2011-02-10 ENCOUNTER — Other Ambulatory Visit (INDEPENDENT_AMBULATORY_CARE_PROVIDER_SITE_OTHER): Payer: Self-pay

## 2011-02-10 DIAGNOSIS — K602 Anal fissure, unspecified: Secondary | ICD-10-CM

## 2011-02-10 MED ORDER — AMBULATORY NON FORMULARY MEDICATION: 15.0000 g | Freq: Four times a day (QID) | Status: DC

## 2011-02-10 NOTE — Telephone Encounter (Signed)
Lori faxed the refill on Diltiazem to [facility]

## 2011-03-01 ENCOUNTER — Telehealth: Payer: Self-pay | Admitting: Physician Assistant

## 2011-03-01 NOTE — Telephone Encounter (Signed)
Left message for Ruth Walker that I am leaving samples of Align at the front desk for her today 03-01-2011.

## 2011-03-29 ENCOUNTER — Other Ambulatory Visit (INDEPENDENT_AMBULATORY_CARE_PROVIDER_SITE_OTHER): Payer: Self-pay | Admitting: General Surgery

## 2011-03-29 DIAGNOSIS — K602 Anal fissure, unspecified: Secondary | ICD-10-CM

## 2011-03-29 MED ORDER — AMBULATORY NON FORMULARY MEDICATION: 15.0000 g | Freq: Four times a day (QID) | Status: DC

## 2011-04-12 ENCOUNTER — Telehealth: Payer: Self-pay | Admitting: Physician Assistant

## 2011-04-12 NOTE — Telephone Encounter (Signed)
I left a message for Ruth Walker advising I put the Align samples at our front desk.

## 2011-04-25 ENCOUNTER — Telehealth: Payer: Self-pay | Admitting: *Deleted

## 2011-04-25 NOTE — Telephone Encounter (Signed)
Lm for patient to call back.  She left a message earlier about where to find plain calcium.

## 2011-04-27 ENCOUNTER — Telehealth (INDEPENDENT_AMBULATORY_CARE_PROVIDER_SITE_OTHER): Payer: Self-pay | Admitting: Surgery

## 2011-04-28 ENCOUNTER — Telehealth: Payer: Self-pay | Admitting: *Deleted

## 2011-04-28 NOTE — Telephone Encounter (Signed)
Pt called questioning her calcium supplement. I talked with her and she said she cant find a plain calcium supplement that doesn't have oyster shells in it. I looked on internet while she was on the phone. And found that Caltrate doesn't have oyster shells in the ingredients. She then questioned too much Vit D since she is on the Vit D 50,000 units qowk. I told her that it wouldn't be too much with her having a Vit D deficiency. She stated that she thought that Dr Reece Agar had told her to take plain calcium. I couldn't find it in the record but she is ok taking the combo Caltrate with Vit D. 1200 mg Calcium and 800 units of Vit D daily.

## 2011-05-01 NOTE — Telephone Encounter (Signed)
I SPOKE WITH PATIENT RE PRN APPOINTMENT AND DILTIAZEM REFILL REQUEST THAT WILL BE FAXED FROM PHARMACY.

## 2011-05-08 ENCOUNTER — Telehealth: Payer: Self-pay | Admitting: Physician Assistant

## 2011-05-09 ENCOUNTER — Telehealth: Payer: Self-pay | Admitting: *Deleted

## 2011-05-09 NOTE — Telephone Encounter (Signed)
Left message for patient that I put some samples of Align at our front desk for her.  I left today's date.

## 2011-05-19 ENCOUNTER — Telehealth (INDEPENDENT_AMBULATORY_CARE_PROVIDER_SITE_OTHER): Payer: Self-pay | Admitting: General Surgery

## 2011-05-19 NOTE — Telephone Encounter (Signed)
FAXED REQ RECEIVED FROM [facility] FOR DILTIAZEM 2% GEL. OK'D PER V.O. DR.T. CORNETT. PHARMACY NOTIFIED/ [id number].

## 2011-05-22 NOTE — Telephone Encounter (Signed)
SEE NOTE DATED 05/09/2011

## 2011-06-10 ENCOUNTER — Other Ambulatory Visit: Payer: Self-pay | Admitting: Cardiovascular Disease

## 2011-06-14 ENCOUNTER — Telehealth (INDEPENDENT_AMBULATORY_CARE_PROVIDER_SITE_OTHER): Payer: Self-pay | Admitting: General Surgery

## 2011-06-14 NOTE — Telephone Encounter (Signed)
DILTIAZEM 2% CREAM REFILL CALLED TO [facility]/ [phone]/ LMOM TO NOTIFY PT AND [facility] WILL ALSO CONTACT HER. GY

## 2011-06-29 ENCOUNTER — Ambulatory Visit (INDEPENDENT_AMBULATORY_CARE_PROVIDER_SITE_OTHER): Payer: Self-pay | Admitting: Surgery

## 2011-07-05 ENCOUNTER — Telehealth: Payer: Self-pay | Admitting: Gastroenterology

## 2011-07-05 MED ORDER — ALIGN 4 MG PO CAPS: 1.0000 | ORAL_CAPSULE | Freq: Every day | ORAL | Status: AC

## 2011-07-05 NOTE — Telephone Encounter (Signed)
Spoke to [employer], PA she states that patient can have 3 boxes of Align and needs office visit. Nomore samples of Align. Patient needs ov with Dr Jarold Motto. She has been getting samples of Align for 6 months and needs to have a follow up with her MD, not a PA. I have left message of this and that pt can call back if she has questions she can call me back.

## 2011-07-05 NOTE — Telephone Encounter (Deleted)
Advised pt that we can not give.

## 2011-07-06 ENCOUNTER — Telehealth: Payer: Self-pay | Admitting: Physician Assistant

## 2011-07-06 NOTE — Telephone Encounter (Signed)
I talked to the patient and saw what Ruth Walker and Ruth Walker talked about regarding Align samples.  I advised the patient that I always would ask Ruth Walker about giving her samples and Ruth Walker would tell me I could give her several boxes at different times, in August 2012, twice in October for example.  The patient was upset that Ruth Walker advised her she has to see Ruth Walker the next time she needs to be seen and there will be no more samples.  The patient told me that Mike Gip PA-C told her that Ruth Walker would see her and if Ruth Walker feels she needs to see Ruth. Jarold Walker , Ruth Walker would tell her so.  I told the patient to pick up the samples that Ruth Walker said she could have, (3 boxes) this time.  There were several times in the last 3-4 months she didn't have the extra money  . The patient was upset the way Ruth Walker talked to her but I tried to be professional and not talk against my coworker.

## 2011-07-13 ENCOUNTER — Telehealth: Payer: Self-pay | Admitting: *Deleted

## 2011-07-13 NOTE — Telephone Encounter (Signed)
Left a message for patient to call me. 

## 2011-07-14 NOTE — Telephone Encounter (Signed)
Left a message for patient to call me. 

## 2011-07-14 NOTE — Telephone Encounter (Signed)
Spoke with patient and medication issues are settled. She will call and ask for me.

## 2011-08-01 ENCOUNTER — Ambulatory Visit (INDEPENDENT_AMBULATORY_CARE_PROVIDER_SITE_OTHER): Payer: BC Managed Care – PPO

## 2011-08-01 DIAGNOSIS — H6980 Other specified disorders of Eustachian tube, unspecified ear: Secondary | ICD-10-CM

## 2011-08-01 DIAGNOSIS — G4700 Insomnia, unspecified: Secondary | ICD-10-CM

## 2011-08-09 ENCOUNTER — Telehealth (INDEPENDENT_AMBULATORY_CARE_PROVIDER_SITE_OTHER): Payer: Self-pay | Admitting: General Surgery

## 2011-08-09 NOTE — Telephone Encounter (Signed)
FAX REQUEST RECEIVED FROM [facility] FOR DILTIAZEM HCL 2% GEL/ OK'D BY DR. T.CORNETT/PRN/CALLED TO [facility] FOR REFILL PRN/ [phone]/ GY

## 2011-08-23 ENCOUNTER — Telehealth: Payer: Self-pay

## 2011-08-23 NOTE — Telephone Encounter (Signed)
Pt states that she somehow got SUN dishwashing detergent in her shoe, pt states that a friend said that dishwashing detergent is poisonous. Pt would like advice because she is unable to afford to come in, pt states she is not having a reaction.

## 2011-08-24 NOTE — Telephone Encounter (Signed)
LMOM to CB. 

## 2011-08-26 NOTE — Telephone Encounter (Signed)
Called pt LM with female to [facility]

## 2011-08-26 NOTE — Telephone Encounter (Signed)
This should be ok. Make sure she washed area with soap and water. If any skin changes occur, RTC.

## 2011-08-26 NOTE — Telephone Encounter (Signed)
Spoke with pt she already spoke with Dr. Elbert Ewings and was advised advice from him

## 2011-09-04 ENCOUNTER — Telehealth: Payer: Self-pay | Admitting: Gastroenterology

## 2011-09-04 NOTE — Telephone Encounter (Signed)
Pt with hx IBS Constipation predominant, Diverticulosis, Cholelithiasis, Recurrent Anal Fissures. Last COLON 09/2008. Pt has been seeing Mike Gip, PA for rectal bleeding d/t acute problems and hasn't seen Dr Jarold Motto since 2009. Called pt who stated she freaks out every time she sees blood. She called Dr Cornett's ofc and they will work her in ; I offered her an appt with Dr Jarold Motto, and she called back and stated she'd see [place]. Pt given an appt 09/05/11 at 10am.

## 2011-09-05 ENCOUNTER — Ambulatory Visit (INDEPENDENT_AMBULATORY_CARE_PROVIDER_SITE_OTHER): Payer: BC Managed Care – PPO | Admitting: Gastroenterology

## 2011-09-05 ENCOUNTER — Telehealth: Payer: Self-pay | Admitting: Gastroenterology

## 2011-09-05 ENCOUNTER — Encounter (INDEPENDENT_AMBULATORY_CARE_PROVIDER_SITE_OTHER): Payer: Self-pay | Admitting: Surgery

## 2011-09-05 ENCOUNTER — Encounter: Payer: Self-pay | Admitting: Gastroenterology

## 2011-09-05 VITALS — BP 128/84 | HR 64 | Ht 66.0 in | Wt 132.6 lb

## 2011-09-05 DIAGNOSIS — F439 Reaction to severe stress, unspecified: Secondary | ICD-10-CM

## 2011-09-05 DIAGNOSIS — F419 Anxiety disorder, unspecified: Secondary | ICD-10-CM | POA: Insufficient documentation

## 2011-09-05 DIAGNOSIS — K625 Hemorrhage of anus and rectum: Secondary | ICD-10-CM | POA: Insufficient documentation

## 2011-09-05 DIAGNOSIS — K589 Irritable bowel syndrome without diarrhea: Secondary | ICD-10-CM | POA: Insufficient documentation

## 2011-09-05 DIAGNOSIS — Z639 Problem related to primary support group, unspecified: Secondary | ICD-10-CM

## 2011-09-05 DIAGNOSIS — K602 Anal fissure, unspecified: Secondary | ICD-10-CM

## 2011-09-05 DIAGNOSIS — K5901 Slow transit constipation: Secondary | ICD-10-CM | POA: Insufficient documentation

## 2011-09-05 DIAGNOSIS — F411 Generalized anxiety disorder: Secondary | ICD-10-CM

## 2011-09-05 MED ORDER — LIDOCAINE 5 % EX OINT: TOPICAL_OINTMENT | CUTANEOUS | Status: DC | PRN

## 2011-09-05 MED ORDER — LINACLOTIDE 145 MCG PO CAPS: 1.0000 | ORAL_CAPSULE | Freq: Every day | ORAL | Status: DC

## 2011-09-05 MED ORDER — NA SULFATE-K SULFATE-MG SULF 17.5-3.13-1.6 GM/177ML PO SOLN: ORAL | Status: DC

## 2011-09-05 NOTE — Telephone Encounter (Addendum)
Pt called for clarification on the Linzess order. Is it ok to take Benefiber in the am and the Linzess in the afternoon even though the box says to take in the am? Pt did report a large BM when she got home, but will complete the Suprep. Thanks.

## 2011-09-05 NOTE — Patient Instructions (Signed)
Use Suprep per instructions. The day after you finish the Suprep start the Linzess one tablet by mouth on an empty stomach, try the samples if they work well call back for an rx. Use the Xylocaine ointment per rectum as needed, rx has been sent to your pharmacy.  Hold your Miralax. Make an office viist for one month.

## 2011-09-05 NOTE — Progress Notes (Signed)
This is a 78 old Caucasian female with chronic IBS, constipation predominant. She has a lot of gas and bloating which has been remarkably improved with Align probiotic therapy. She has history of recurrent external hemorrhoids, fissures, and rectal bleeding. She recently has had increased rectal discomfort with large bulky bowel movements, also some small amount of fresh heme. Last colonoscopy was 4 years ago. She does not have a family history of colon polyps or cancer. She denied significant abdominal pain, upper GI, or hepatobiliary complaints. She is on diltiazem anal cream, MiraLax, and daily Benefiber. Over symptoms of been exacerbated by recent severe marital stress. Review of previous CT scan shows evidence of cholelithiasis. CBC 1 year ago was normal.  Current Medications, Allergies, Past Medical History, Past Surgical History, Family History and Social History were reviewed in [employer] record.  Pertinent Review of Systems Negative   Physical Exam: There are excoriations on her right facial and neck area related to nervous scratching and irritation.  healthy-appearing patient in no distress. Blood pressure 128/84. BMI is 21.4. I cannot appreciate stigmata of chronic liver disease. Her chest is clear, and she is in a regular rhythm without murmurs gallops or rubs. There is mild abdominal distention but no organomegaly, masses, or tenderness. Bowel sounds are nonobstructive in nature. Rectal exam shows a large impaction in the rectal vault with stool is guaiac-negative. She has a superficial posterior fissure evident. Mental status is clear, peripheral extremities are unremarkable.   Assessment and Plan: Constipation predominant IBS with current fecal impaction. I have placed her on Supra colonoscopy bowel prep, and then have asked her to get on daily Linzess 145 milligrams daily and try to adjust her MiraLax to when necessary usage. We will continue her diltiazem anal gel  and also alternate 5% local Xylocaine cream. Will continue her daily Benefiber and liberal by mouth fluids. Also she seems to be doing extremely well on probiotic therapy to advise her to take daily as tolerated. She may need psychological counseling referral for her anxiety and depression. Other medications as per primary care . He is to return to see me in one month's time and we will ascertain if she needs repeat colonoscopy exam.   Please copy her primary care physician, referring physician, and pertinent subspecialists. No diagnosis found.

## 2011-09-05 NOTE — Telephone Encounter (Signed)
Left message for pt to call back  °

## 2011-09-05 NOTE — Telephone Encounter (Signed)
Both in am

## 2011-09-05 NOTE — Telephone Encounter (Signed)
Called pts number and someone picked phone up and then hung up

## 2011-09-06 NOTE — Telephone Encounter (Signed)
Informed pt Dr Jarold Motto would like for her to take the Benefiber and Linzess both in the am. The Linzess directions say to take 30 minutes prior to 1st meal os the day and she can adjust her other meds. Pt reports she got good results from the Suprep.

## 2011-09-08 ENCOUNTER — Telehealth: Payer: Self-pay | Admitting: *Deleted

## 2011-09-08 ENCOUNTER — Encounter: Payer: Self-pay | Admitting: *Deleted

## 2011-09-08 NOTE — Telephone Encounter (Signed)
Opened in error

## 2011-09-08 NOTE — Telephone Encounter (Signed)
Late entry   09/07/11 spoke with pt several times on 09/07/11 about her BMs and medication management. Pt had taken Suprep and purged her system and was supposed to start the Linzess. She had a difficut time in deciding when to start what med, how long to wait before taking another, etc. She ended up taking the Linzess and then called back with a report of cramping and a massive loose BM.  She reported she really doesn't have constipation, it's only been for 1 week. She had gotten into a regimen of Benefiber, Miralax and Align, so she doesn't think she needs the Linzess. Pt stated she will start her regimen again since she has been purged and I will forward this to Dr Jarold Motto.

## 2011-09-13 ENCOUNTER — Telehealth: Payer: Self-pay | Admitting: *Deleted

## 2011-09-13 NOTE — Telephone Encounter (Signed)
Pt lmom requesting samples of Align. [phone]. lmom I left her samples of Align at the front desk. Pt may call back for questions.

## 2011-10-02 ENCOUNTER — Encounter: Payer: Self-pay | Admitting: *Deleted

## 2011-10-03 ENCOUNTER — Ambulatory Visit (INDEPENDENT_AMBULATORY_CARE_PROVIDER_SITE_OTHER): Payer: BC Managed Care – PPO | Admitting: Gastroenterology

## 2011-10-03 ENCOUNTER — Encounter: Payer: Self-pay | Admitting: Gastroenterology

## 2011-10-03 VITALS — BP 124/68 | HR 72 | Ht 66.0 in | Wt 133.0 lb

## 2011-10-03 DIAGNOSIS — K5901 Slow transit constipation: Secondary | ICD-10-CM

## 2011-10-03 DIAGNOSIS — K589 Irritable bowel syndrome without diarrhea: Secondary | ICD-10-CM

## 2011-10-03 MED ORDER — HYOSCYAMINE-PHENYLTOLOXAMINE 0.0625-15 MG PO CAPS: 1.0000 | ORAL_CAPSULE | Freq: Two times a day (BID) | ORAL | Status: DC

## 2011-10-03 NOTE — Progress Notes (Signed)
This is a very nice 66 year old Caucasian female with constipation predominant IBS and recent fecal impaction in her rectum requiring colonoscopy prep and treatment of an anal fissure. She currently is doing much better, could not tolerate Linzess because of diarrhea, and is currently on fiber, liberal by mouth fluids, and nightly MiraLax with good response. She continues with episodes of crampy abdominal gas and bloating, but denies rectal pain or rectal bleeding.  Current Medications, Allergies, Past Medical History, Past Surgical History, Family History and Social History were reviewed in [employer] record.  Pertinent Review of Systems Negative   Physical Exam: Pressure 124/68 and pulse 72 and regular. BMI 21.47. I cannot appreciate stigmata of chronic liver disease. Inspection of her rectum shows no fissures or fistulae. Rectal exam is deferred. Mental status is normal.    Assessment and Plan: Constipation predominant IBS currently doing well with MiraLax and fiber supplements. Her anal fissure seems to have resolved with local anal care. I have prescribed when necessary Digex anti-spasmodic saver 12 hours as tolerated. She is to call if she has worsening of her constipation. I will see her on a when necessary basis as needed. We had a long discussion today concerning her stress management and various personal problems. No diagnosis found.

## 2011-10-03 NOTE — Patient Instructions (Signed)
Take Digex twice a day as needed, stop if you get constipated and call back.

## 2011-10-08 ENCOUNTER — Ambulatory Visit (INDEPENDENT_AMBULATORY_CARE_PROVIDER_SITE_OTHER): Payer: BC Managed Care – PPO | Admitting: Family Medicine

## 2011-10-08 VITALS — BP 149/78 | HR 88 | Temp 98.4°F | Resp 16 | Ht 66.0 in | Wt 134.0 lb

## 2011-10-08 DIAGNOSIS — J40 Bronchitis, not specified as acute or chronic: Secondary | ICD-10-CM

## 2011-10-08 DIAGNOSIS — J209 Acute bronchitis, unspecified: Secondary | ICD-10-CM

## 2011-10-08 MED ORDER — AZITHROMYCIN 250 MG PO TABS: ORAL_TABLET | ORAL | Status: AC

## 2011-10-08 NOTE — Patient Instructions (Signed)

## 2011-10-08 NOTE — Progress Notes (Signed)
66 year old woman with rhinorrhea and cough for the last week. Has been getting worse. No known fever, chest pain or shortness of breath. She takes care of her grandchildren who have been sick lately.  No GI symptoms.  Objective: HEENT unremarkable except for rhinorrhea, swollen turbinates.  Neck: Supple no adenopathy  Chest: Clear to auscultation with the exception of some rhonchi on the right.  Assessment acute bronchitis, without respiratory compromise, plan: Z-Pak

## 2011-10-11 ENCOUNTER — Telehealth: Payer: Self-pay

## 2011-10-11 MED ORDER — BENZONATATE 100 MG PO CAPS: ORAL_CAPSULE | ORAL | Status: AC

## 2011-10-11 NOTE — Telephone Encounter (Signed)
Pt states that she was diagnosed with bronchitis by Dr.Lauenstein and was given Z-pac, as of now her cough is worse and she also has a tickle in her throat. Pt also is taking Claritin which has helped a little with the tickle in her throat. Pt would like to know if she should come back in for an OV if after she finishes up with the Z-Pac she still does not feel any better. * pt has 2 days left of the Z-pac and was told by her pharmacist that she was not giving the Z-pac enough time to work.

## 2011-10-11 NOTE — Telephone Encounter (Signed)
Spoke with pt and went over response, pt understood and would give it a try.

## 2011-10-11 NOTE — Telephone Encounter (Signed)
The pharmacist is completely correct.  The Abx stays in the system for a total of 10d.  I am happy to send in a cough medication for the tickle in throat (which I did).

## 2011-10-12 ENCOUNTER — Ambulatory Visit (INDEPENDENT_AMBULATORY_CARE_PROVIDER_SITE_OTHER): Payer: BC Managed Care – PPO | Admitting: Emergency Medicine

## 2011-10-12 VITALS — BP 162/84 | HR 80 | Temp 97.9°F | Resp 18 | Ht 66.0 in | Wt 134.0 lb

## 2011-10-12 DIAGNOSIS — J309 Allergic rhinitis, unspecified: Secondary | ICD-10-CM

## 2011-10-12 DIAGNOSIS — J40 Bronchitis, not specified as acute or chronic: Secondary | ICD-10-CM

## 2011-10-12 DIAGNOSIS — R05 Cough: Secondary | ICD-10-CM

## 2011-10-12 MED ORDER — METHYLPREDNISOLONE ACETATE 80 MG/ML IJ SUSP
80.0000 mg | Freq: Once | INTRAMUSCULAR | Status: AC
  Administered 2011-10-12: 80 mg via INTRAMUSCULAR

## 2011-10-12 NOTE — Progress Notes (Signed)
  Subjective:    Patient ID: Ruth Walker, female    DOB: 1946-06-29, 66 y.o.   MRN: [id number]  HPI patient and her she continues to have cough and postnasal drip. She was started on a Z-Pak 4 days ago. She does have a history of allergies and has been on allergy injections in the past at Dr. Jackey Loge office.    Review of Systems positive as relates to the present illness.     Objective:   Physical Exam  Constitutional: She appears well-developed and well-nourished.  HENT:  Right Ear: External ear normal.  Left Ear: External ear normal.  Eyes: Pupils are equal, round, and reactive to light.  Neck: No JVD present. No tracheal deviation present. No thyromegaly present.  Cardiovascular: Normal rate and regular rhythm.  Exam reveals no gallop and no friction rub.   No murmur heard. Pulmonary/Chest: No respiratory distress. She has no wheezes. She has no rales. She exhibits no tenderness.  Lymphadenopathy:    She has no cervical adenopathy.          Assessment & Plan:   Patient under treatment for allergic rhinitis with bronchitis. She's been on day 4 of her Zithromax to take one more dose. He continues to have a feeling of hoarseness. She continues to cough. She continues at to have  postnasal drip she has had some problems with insomnia. Was switched to Allegra at night. She may also try Zyrtec at night instead. Go ahead and give 80 of Depo-Medrol now in that she has taken this in the past with good results.Marland Kitchen

## 2011-11-09 ENCOUNTER — Other Ambulatory Visit: Payer: Self-pay | Admitting: Obstetrics and Gynecology

## 2011-11-18 ENCOUNTER — Other Ambulatory Visit: Payer: Self-pay | Admitting: Obstetrics and Gynecology

## 2011-12-06 ENCOUNTER — Encounter: Payer: Self-pay | Admitting: Gynecology

## 2011-12-06 DIAGNOSIS — M810 Age-related osteoporosis without current pathological fracture: Secondary | ICD-10-CM | POA: Insufficient documentation

## 2011-12-06 DIAGNOSIS — N840 Polyp of corpus uteri: Secondary | ICD-10-CM | POA: Insufficient documentation

## 2011-12-06 DIAGNOSIS — I82409 Acute embolism and thrombosis of unspecified deep veins of unspecified lower extremity: Secondary | ICD-10-CM | POA: Insufficient documentation

## 2011-12-06 DIAGNOSIS — M4850XA Collapsed vertebra, not elsewhere classified, site unspecified, initial encounter for fracture: Secondary | ICD-10-CM | POA: Insufficient documentation

## 2011-12-13 ENCOUNTER — Ambulatory Visit (INDEPENDENT_AMBULATORY_CARE_PROVIDER_SITE_OTHER): Payer: Medicare Other | Admitting: Obstetrics and Gynecology

## 2011-12-13 ENCOUNTER — Encounter: Payer: Self-pay | Admitting: Obstetrics and Gynecology

## 2011-12-13 ENCOUNTER — Other Ambulatory Visit (HOSPITAL_COMMUNITY)
Admission: RE | Admit: 2011-12-13 | Discharge: 2011-12-13 | Disposition: A | Discharge status: Home / Self Care | Payer: Medicare Other | Source: Ambulatory Visit | Attending: Obstetrics and Gynecology | Admitting: Obstetrics and Gynecology

## 2011-12-13 VITALS — BP 120/74 | Ht 66.0 in | Wt 126.0 lb

## 2011-12-13 DIAGNOSIS — Z124 Encounter for screening for malignant neoplasm of cervix: Secondary | ICD-10-CM

## 2011-12-13 DIAGNOSIS — R609 Edema, unspecified: Secondary | ICD-10-CM

## 2011-12-13 DIAGNOSIS — M810 Age-related osteoporosis without current pathological fracture: Secondary | ICD-10-CM

## 2011-12-13 DIAGNOSIS — R600 Localized edema: Secondary | ICD-10-CM

## 2011-12-13 DIAGNOSIS — N952 Postmenopausal atrophic vaginitis: Secondary | ICD-10-CM

## 2011-12-13 NOTE — Progress Notes (Signed)
Patient came to see me today for further follow up. She has been on either Actonel or Fosamax since 2004 for osteoporosis. Her last bone density was 2009. She's had no fractures. She takes calcium and vitamin D. She takes a high dose vitamin D every other week. She is not having intercourse but she uses Estrace cream for burning and vaginal irritation with good results. She is having no vaginal bleeding. She is having no pelvic pain. She is due for her mammogram in July. She does get  fluid retention in her legs but responds well to diuretics to we give her. She is having no dysuria, urgency, frequency or incontinence.  ROS: 12 system review done. Pertinent positives above. Other positives include hypertension, asthma, gallstones,diverticulosis, anxiety, vertebral compression fracture.  Physical examination: Kennon Portela present. HEENT within normal limits. Neck: Thyroid not large. No masses. Supraclavicular nodes: not enlarged. Breasts: Examined in both sitting and lying  position. No skin changes and no masses. Abdomen: Soft no guarding rebound or masse or hernia. Pelvic: External: Within normal limits. BUS: Within normal limits. Vaginal:within normal limits. Good estrogen effect. No evidence of cystocele rectocele or enterocele. Cervix: clean. Uterus: Normal size and shape. Adnexa: No masses. Rectovaginal exam: Confirmatory and negative. Extremities: Within normal limits.  Assessment: #1. Osteoporosis with history of vertebral compression fracture #2. Atrophic vaginitis #3. Vitamin D deficiency #4. Fluid retention  Plan: Mammogram in bone density in July. Patient will call me after that and we will decide about drug holiday. We did not discuss her vertebral compression fracture today and we need to discuss that prior to deciding drug holiday. Continue Estrace vaginal cream. Continue diuretic. Continue vitamin D.

## 2011-12-13 NOTE — Patient Instructions (Signed)
Suggest that PCP check your cholesterol  And hgbA1C.

## 2011-12-14 ENCOUNTER — Other Ambulatory Visit: Payer: Self-pay | Admitting: Obstetrics and Gynecology

## 2011-12-30 ENCOUNTER — Ambulatory Visit (INDEPENDENT_AMBULATORY_CARE_PROVIDER_SITE_OTHER): Payer: Medicare Other | Admitting: Family Medicine

## 2011-12-30 VITALS — BP 158/92 | HR 84 | Temp 98.5°F | Resp 16 | Ht 66.0 in | Wt 127.0 lb

## 2011-12-30 DIAGNOSIS — R21 Rash and other nonspecific skin eruption: Secondary | ICD-10-CM

## 2011-12-30 DIAGNOSIS — J309 Allergic rhinitis, unspecified: Secondary | ICD-10-CM

## 2011-12-30 DIAGNOSIS — Z79899 Other long term (current) drug therapy: Secondary | ICD-10-CM

## 2011-12-30 DIAGNOSIS — E785 Hyperlipidemia, unspecified: Secondary | ICD-10-CM

## 2011-12-30 LAB — POCT CBC
Granulocyte percent: 73.7 %G (ref 37–80)
HCT, POC: 48.4 % — AB (ref 37.7–47.9)
Hemoglobin: 15.8 g/dL (ref 12.2–16.2)
Lymph, poc: 2 (ref 0.6–3.4)
MCH, POC: 29.7 pg (ref 27–31.2)
MCHC: 32.6 g/dL (ref 31.8–35.4)
MCV: 91 fL (ref 80–97)
MID (cbc): 0.5 (ref 0–0.9)
MPV: 9.4 fL (ref 0–99.8)
POC Granulocyte: 6.9 (ref 2–6.9)
POC LYMPH PERCENT: 21.5 %L (ref 10–50)
POC MID %: 4.8 %M (ref 0–12)
Platelet Count, POC: 321 10*3/uL (ref 142–424)
RBC: 5.32 M/uL (ref 4.04–5.48)
RDW, POC: 12.5 %
WBC: 9.4 10*3/uL (ref 4.6–10.2)

## 2011-12-30 NOTE — Progress Notes (Signed)
This 66 year old retired woman who comes in complaining of a skin change on her abdomen, need for routine labs, and sinus congestion.  A skin change on her abdomen is a small half centimeter indurated area in the hypogastrium just to the right of the midline. She may have suffered a bug bite but she wants reassurance about whether this is a significant change.  She was recently seen by Dr. Eda Paschal again for her annual exam. He said he could not routine labs her cholesterol and chronic medications. He referred her here for these.  Patient also has had about a week of sinus congestion. She has a long history of allergic rhinitis and recently got a shot for this. Nevertheless she is continued to have some postnasal drainage. She's had no fever or epistaxis.  Objective: There is a nonpigmented, non-erythematous half centimeter area of induration just the right of midline and are hypogastrium. There is some excoriation just above it and to left of it.  Patient's in no acute distress and she's pleasant and communicative.  HEENT is relatively unremarkable with mild erythema in the right nasal passage but no exudates and no significant mucopurulent discharge.  Lungs are clear, heart is regular without murmur  Assessment: Insignificant skin change the hypogastrium which are clear without treatment, allergic rhinitis, hyper lipidemia  Plan:  Patient reassured about the change in her skin and told that she needs to do nothing about it, continue medicines for allergies, check lipid lipid panel and contact patient with it returns. 1. Hyperlipidemia  Lipid panel, Comprehensive metabolic panel, POCT CBC  2. High risk medication use

## 2011-12-31 LAB — COMPREHENSIVE METABOLIC PANEL
ALT: 21 U/L (ref 0–35)
AST: 23 U/L (ref 0–37)
Albumin: 5.1 g/dL (ref 3.5–5.2)
Alkaline Phosphatase: 62 U/L (ref 39–117)
BUN: 12 mg/dL (ref 6–23)
CO2: 29 mEq/L (ref 19–32)
Calcium: 10.9 mg/dL — ABNORMAL HIGH (ref 8.4–10.5)
Chloride: 101 mEq/L (ref 96–112)
Creat: 0.73 mg/dL (ref 0.50–1.10)
Glucose, Bld: 106 mg/dL — ABNORMAL HIGH (ref 70–99)
Potassium: 4.5 mEq/L (ref 3.5–5.3)
Sodium: 139 mEq/L (ref 135–145)
Total Bilirubin: 0.5 mg/dL (ref 0.3–1.2)
Total Protein: 7.8 g/dL (ref 6.0–8.3)

## 2011-12-31 LAB — LIPID PANEL
Cholesterol: 215 mg/dL — ABNORMAL HIGH (ref 0–200)
HDL: 75 mg/dL (ref >39)
LDL Cholesterol: 128 mg/dL — ABNORMAL HIGH (ref 0–99)
Total CHOL/HDL Ratio: 2.9 Ratio
Triglycerides: 59 mg/dL (ref <150)
VLDL: 12 mg/dL (ref 0–40)

## 2012-01-03 ENCOUNTER — Other Ambulatory Visit: Payer: Self-pay | Admitting: *Deleted

## 2012-01-03 DIAGNOSIS — M810 Age-related osteoporosis without current pathological fracture: Secondary | ICD-10-CM

## 2012-01-14 ENCOUNTER — Ambulatory Visit (INDEPENDENT_AMBULATORY_CARE_PROVIDER_SITE_OTHER): Payer: Medicare Other | Admitting: Family Medicine

## 2012-01-14 VITALS — BP 138/88 | HR 108 | Temp 98.6°F | Resp 16 | Ht 66.0 in | Wt 123.6 lb

## 2012-01-14 DIAGNOSIS — K602 Anal fissure, unspecified: Secondary | ICD-10-CM

## 2012-01-14 DIAGNOSIS — L989 Disorder of the skin and subcutaneous tissue, unspecified: Secondary | ICD-10-CM

## 2012-01-14 DIAGNOSIS — E785 Hyperlipidemia, unspecified: Secondary | ICD-10-CM

## 2012-01-14 NOTE — Progress Notes (Signed)
66 yo with skin lesion recently right parasternal area x 3 days and rectal discomfort.  O:  Papule 4 mm right parasternal area c/w insect bite "Anus:  Healing fissure  Discussed cholesterol and the above  A:  No significant problems.  Needs reassurance  P:  Reassured. Work on vegetable increase.

## 2012-01-15 ENCOUNTER — Other Ambulatory Visit: Payer: Self-pay | Admitting: Obstetrics and Gynecology

## 2012-01-15 DIAGNOSIS — M810 Age-related osteoporosis without current pathological fracture: Secondary | ICD-10-CM

## 2012-01-18 ENCOUNTER — Ambulatory Visit (INDEPENDENT_AMBULATORY_CARE_PROVIDER_SITE_OTHER): Payer: Medicare Other | Admitting: Obstetrics and Gynecology

## 2012-01-18 DIAGNOSIS — M810 Age-related osteoporosis without current pathological fracture: Secondary | ICD-10-CM

## 2012-01-18 NOTE — Progress Notes (Signed)
Patient came in to see me today to discuss whether she should reinitiate treatment for her osteoporosis. Her T score on her recent bone density was -2.5. Her spine actually showed significant improvement which was felt to be secondary to degenerative changes. Although there was a slight reduction in bone density of the hip it was not statistically significant. The patient has been on either Fosamax or Actonel since 2004. She has had a vertebral compression fracture when she fell in the shower but her orthopedist felt it was traumatic and not a fragility fracture. We had a very long discussion about whether to continue to treat her. I told her I favored switching drugs after these years. I explained her that if we kept her on drug holiday the fosamax  Stays  in her system for a significant period of time. She understands that if she's treated she might have a fragility fracture and if she's not treated she might not fracture and therefore neither is perfect. Due to her bone density still showing osteoporosis I favored continued treatment and we discussed Prolia, Reclast and Forteo. I told her I favored Prolia. The patient was given information. We will get her approved for Prolia. She will decide whether she wants to proceed. Total time of interview was 30 minutes.

## 2012-01-19 ENCOUNTER — Encounter: Payer: Self-pay | Admitting: Obstetrics and Gynecology

## 2012-01-19 ENCOUNTER — Telehealth: Payer: Self-pay

## 2012-01-19 NOTE — Telephone Encounter (Signed)
PT WOULD LIKE TO SPEAK WITH SOMEONE REGARDING THE CREAM ON HER BACK. IT IS BETTER, BUT HASN'T GONE AWAY. PLEASE CALL [phone]

## 2012-01-20 NOTE — Telephone Encounter (Signed)
PATIENT CALLED AGAIN ABOUT THE BUG BITE ON HER BACK.   ITS BEEN THERE ALMOST A WEEK AND IS STILL ITCHING.  WANTS TO KNOW IF SHE MAY NEED AN ANTIBIOTIC OR SOME TYPE OF CREAM.  SAYS SHE WILL NOT PUT ANYTHING ON IT UNTIL SHE HEARS FROM [place].  CALL [phone] AND PLEASE LEAVE MESSAGE IF SHE IS NOT THERE.

## 2012-01-21 ENCOUNTER — Ambulatory Visit (INDEPENDENT_AMBULATORY_CARE_PROVIDER_SITE_OTHER): Payer: Medicare Other | Admitting: Family Medicine

## 2012-01-21 VITALS — BP 118/70 | HR 72 | Temp 99.0°F | Resp 18 | Ht 66.0 in | Wt 123.0 lb

## 2012-01-21 DIAGNOSIS — F419 Anxiety disorder, unspecified: Secondary | ICD-10-CM

## 2012-01-21 DIAGNOSIS — R059 Cough, unspecified: Secondary | ICD-10-CM

## 2012-01-21 DIAGNOSIS — F411 Generalized anxiety disorder: Secondary | ICD-10-CM

## 2012-01-21 DIAGNOSIS — R05 Cough: Secondary | ICD-10-CM

## 2012-01-21 MED ORDER — ALPRAZOLAM 0.25 MG PO TABS: 0.2500 mg | ORAL_TABLET | Freq: Every evening | ORAL | Status: AC | PRN

## 2012-01-21 NOTE — Telephone Encounter (Signed)
Called patient. She will return to clinic.

## 2012-01-21 NOTE — Telephone Encounter (Signed)
Please advise the patient to RTC for re-evaluation. 

## 2012-01-21 NOTE — Progress Notes (Signed)
Date:  01/21/2012   Name:  Ruth Walker   DOB:  04-09-1946   MRN:  [id number]  PCP:  Elvina Sidle, MD    Chief Complaint: Insect Bite and Cough   History of Present Illness:  Ruth Walker is a 66 y.o. very pleasant female patient who presents with the following:  Here today with "a multitude of issues."   She currently has a "bug bite" on her right chest wall and it seems to rub against her bra and is slow to heal.   She also notes sinus drainage down her throat.  In the morning she has to cough up some phlegm, but she is not feeling badly. She is fatigued from taking care of her grandchildren a lot recently.   No fever- temp up to 99  Ruth Walker also admits that her husband of 47 years is not very easy to live with of late.  He is very depressed and unpleasant.  She is not sure what she should do to help him.   Ruth Walker has insight into her obsessive tendencies and knows that she worries about physical issues more than she probably should.   Patient Active Problem List  Diagnosis  . OBSESSIVE-COMPULSIVE PERSONALITY DISORDER  . HYPERTENSION  . HEMORRHOIDS  . ASTHMA  . IRRITABLE BOWEL SYNDROME  . ANAL FISSURE  . ANAL OR RECTAL PAIN  . CYSTITIS, CHRONIC  . TACHYCARDIA  . Diverticulosis  . Gallstones  . IBS (irritable bowel syndrome)  . Constipation, slow transit  . BRBPR (bright red blood per rectum)  . Anxiety disorder  . DVT (deep venous thrombosis)  . Uterine polyp  . Osteoporosis  . Vertebral compression fracture    Past Medical History  Diagnosis Date  . Hemorrhoids   . Irritable bowel syndrome (IBS)   . Asthma     Gets allergy shots once a week   . Skin cancer     Leg  . Cholelithiasis   . Diverticulosis   . Anal fissure   . Anxiety states   . Uterine polyp   . Osteoporosis   . Vertebral compression fracture   . Environmental allergies     Past Surgical History  Procedure Date  . Cesarean section   . Hemorrhoid surgery   . Back surgery   .  Skin cancer excision     Leg   . Endometrial polypectomy   . Hysteroscopy   . Dilation and curettage of uterus     History  Substance Use Topics  . Smoking status: Never Smoker   . Smokeless tobacco: Never Used  . Alcohol Use: No    Family History  Problem Relation Age of Onset  . Diabetes Mother   . Hypertension Mother   . Colon cancer Neg Hx   . Hypertension Father   . Heart disease Father     Allergies  Allergen Reactions  . Aloe   . Cephalexin     REACTION: coarse rash  . Ciprofloxacin   . Shellfish Allergy   . Sulfonamide Derivatives     Medication list has been reviewed and updated.  Current Outpatient Prescriptions on File Prior to Visit  Medication Sig Dispense Refill  . AMBULATORY NON FORMULARY MEDICATION Place 15 g rectally 4 (four) times daily. Diltiazem 2% gel apply to rectum qid  15 g  1  . aspirin 81 MG tablet Take 81 mg by mouth daily.        . Calcium Carbonate-Vit D-Min (CALCIUM 1200)  1200-1000 MG-UNIT CHEW Chew 1 tablet by mouth daily.        . cycloSPORINE (RESTASIS) 0.05 % ophthalmic emulsion 1 drop 2 (two) times daily.        Marland Kitchen estradiol (ESTRACE VAGINAL) 0.1 MG/GM vaginal cream Place vaginally. As directed       . hydrochlorothiazide (HYDRODIURIL) 25 MG tablet take 1 tablet by mouth once daily  30 tablet  9  . Hyoscyamine-Phenyltoloxamine (DIGEX NF) 1.6109-60 MG CAPS Take 1 capsule by mouth 2 (two) times daily.  60 each  3  . lidocaine (XYLOCAINE) 5 % ointment Apply topically as needed.  35.44 g  0  . loratadine (CLARITIN) 10 MG tablet Take 10 mg by mouth daily.      . metoprolol tartrate (LOPRESSOR) 25 MG tablet take 1 tablet by mouth twice a day  60 tablet  11  . polyethylene glycol (MIRALAX / GLYCOLAX) packet Take 17 g by mouth daily. dissolve in 8 oz. Of water at bedtime       . Probiotic Product (ALIGN) 4 MG CAPS Take 1 capsule by mouth daily.  21 capsule  0  . trimethoprim (TRIMPEX) 100 MG tablet Take 100 mg by mouth daily.        .  Vitamin D, Ergocalciferol, (DRISDOL) 50000 UNITS CAPS take 1 capsule by mouth every OTHER WEEK  5 capsule  5  . Wheat Dextrin (BENEFIBER DRINK MIX PO) Take 10 mLs by mouth every morning. Take 2 teaspoons every morning        . alendronate (FOSAMAX) 70 MG tablet take 1 tablet by mouth every week  4 tablet  0  . DISCONTD: Linaclotide (LINZESS) 145 MCG CAPS Take 1 capsule by mouth daily.  8 capsule  0    Review of Systems:  As per HPI- otherwise negative.   Physical Examination: Filed Vitals:   01/21/12 1504  BP: 118/70  Pulse: 72  Temp: 99 F (37.2 C)  Resp: 18   Filed Vitals:   01/21/12 1504  Height: 5\' 6"  (1.676 m)  Weight: 123 lb (55.792 kg)   Body mass index is 19.85 kg/(m^2). Ideal Body Weight: Weight in (lb) to have BMI = 25: 154.6   GEN: WDWN, NAD, Non-toxic, A & O x 3 HEENT: Atraumatic, Normocephalic. Neck supple. No masses, No LAD.  TM and oropharynx wnl, PEERL. Ears and Nose: No external deformity. CV: RRR, No M/G/R. No JVD. No thrill. No extra heart sounds. PULM: CTA B, no wheezes, crackles, rhonchi. No retractions. No resp. distress. No accessory muscle use. Chest wall: there is a small (about 1cm in diameter) raised skin lesion on her right chest wall- appears c/w contact dermatitis.  ABD: S, NT, ND, +BS. No rebound. No HSM. EXTR: No c/c/e NEURO Normal gait.  PSYCH: Normally interactive. Conversant.   Assessment and Plan: 1. Anxiety  ALPRAZolam (XANAX) 0.25 MG tablet  2. Cough     Reassured that her symptoms seem to represent a viral URI.  Let me know if not better soon.  Try a topical steroid for the area on her chest and report if not better within a few weeks.   Ruth Walker could use counseling- discussed this with her and she will look into it.  She knows that she cannot solve all of her husband's many problems.  She would like something to help her sleep at night- when she lies down her thoughts tend to keep her awake.  Gave rx for xanax as above to use  sparingly  on a prn basis.    Abbe Amsterdam, MD

## 2012-02-03 ENCOUNTER — Ambulatory Visit (INDEPENDENT_AMBULATORY_CARE_PROVIDER_SITE_OTHER): Payer: Medicare Other | Admitting: Family Medicine

## 2012-02-03 VITALS — BP 144/83 | HR 97 | Temp 98.0°F | Resp 16 | Ht 66.0 in | Wt 127.0 lb

## 2012-02-03 DIAGNOSIS — F411 Generalized anxiety disorder: Secondary | ICD-10-CM

## 2012-02-03 DIAGNOSIS — L989 Disorder of the skin and subcutaneous tissue, unspecified: Secondary | ICD-10-CM

## 2012-02-03 DIAGNOSIS — M999 Biomechanical lesion, unspecified: Secondary | ICD-10-CM

## 2012-02-03 NOTE — Progress Notes (Signed)
Subjective: 66 year old lady who has a history of a previous squamous cell carcinoma on her leg. It was a long time been diagnosed, and she is concerned about her skin. She's been seen for a lesion on her chest wall which is controlled with diet. It has not gone away. It itches a little bit.  Objective: Patient has several little seborrheic keratosis looking lesions on her chest wall, but when she is concerned about his largest. Mildly erythematous, but I think she is probably scratching at some. It is about 2 cm x 8 mm. It has irregular margins. Some areas that are little more nodular than others. It is nonpigmented. We discussed that it needs to be biopsied to diagnose what it is, that I did not think it was a insect bite at this time.  Procedure note: Lesion was numbed with 1% lidocaine with epinephrine. 2 punch biopsies were done using the 2 mm punch. Specimens were sent for pathology. Patient tolerated the procedure well.  Assessment: Suspicious skin lesion, seborrheic keratosis versus basal cell skin carcinoma.  Plan: Patient is very anxious. We have to wait for the pathology report to make a diagnosis. She's getting ready to go to the beach next Thursday, and I told her I would call her as soon as I got a report.

## 2012-02-27 ENCOUNTER — Encounter: Payer: Self-pay | Admitting: Family Medicine

## 2012-03-01 ENCOUNTER — Telehealth: Payer: Self-pay | Admitting: Gastroenterology

## 2012-03-01 NOTE — Telephone Encounter (Signed)
Pt reports she ate the wrong foods, didn't drink enough water, got constipated and now has a painful knot in her rectum. She doesn't think it's thrombosed, but she wants it checked out. Explained to pt I don't have anything until Monday with Willette Cluster, NP or Tuesday with Dr Jarold Motto. Offered to ask DR Jarold Motto for something for constipation and/or diarrhea, but she stated the constipation is under control and she will do "shower sitz baths" tid.

## 2012-03-02 ENCOUNTER — Ambulatory Visit (INDEPENDENT_AMBULATORY_CARE_PROVIDER_SITE_OTHER): Payer: Medicare Other | Admitting: Family Medicine

## 2012-03-02 VITALS — BP 146/90 | HR 90 | Temp 98.5°F | Resp 18 | Ht 66.0 in | Wt 126.0 lb

## 2012-03-02 DIAGNOSIS — J069 Acute upper respiratory infection, unspecified: Secondary | ICD-10-CM

## 2012-03-02 DIAGNOSIS — L821 Other seborrheic keratosis: Secondary | ICD-10-CM

## 2012-03-02 NOTE — Progress Notes (Addendum)
Subjective: She continues to be concerned about a spot on her chest wall. The pathology came back benign. For some reason I cannot find a copy of the pathology itself in the chart. However the place is sloughed off mostly now, just leaving a 1.5 CM spot that is erythematous. She is still self-conscious about that because it is in the V. of her collar line. It is not symptomatic.  She also has a little upper respiratory infection with postnasal drainage for the last few days. Slight cough with that. No runny nose or sore throat. Her  Objective her TMs are normal. Throat clear. Neck supple without nodes thyromegaly. Chest clear. Heart regular without murmurs. Has a little red spot as noted above on the chest wall of the between the right breast and these upper sternum.  Assessment: Seborrheic dermatitis, resolving   ADDENDUM:  LATER FOUND THE PATH REPORT:  LICHENOID KERATOSIS URI  Plan: Return if the spot persists. I will do a superficial freezing of it if needed.  Take Claritin for her URI.

## 2012-03-04 ENCOUNTER — Encounter: Payer: Self-pay | Admitting: Nurse Practitioner

## 2012-03-04 ENCOUNTER — Ambulatory Visit: Payer: Medicare Other | Admitting: Nurse Practitioner

## 2012-03-04 ENCOUNTER — Ambulatory Visit (INDEPENDENT_AMBULATORY_CARE_PROVIDER_SITE_OTHER): Payer: Medicare Other | Admitting: Nurse Practitioner

## 2012-03-04 VITALS — BP 130/70 | HR 66 | Ht 66.25 in | Wt 125.2 lb

## 2012-03-04 DIAGNOSIS — K644 Residual hemorrhoidal skin tags: Secondary | ICD-10-CM

## 2012-03-04 MED ORDER — PRAMOXINE-HC 1-2.5 % EX CREA: TOPICAL_CREAM | CUTANEOUS | Status: DC

## 2012-03-04 MED ORDER — LIDOCAINE-HYDROCORTISONE ACE 3-0.5 % RE CREA: 1.0000 | TOPICAL_CREAM | Freq: Two times a day (BID) | RECTAL | Status: DC

## 2012-03-04 NOTE — Progress Notes (Signed)
Ruth Walker [id number] September 11, 1945   HISTORY OR PRESENT ILLNESS :  Patient is a 66 year old female known to Dr. Jarold Motto for history of constipation predominant irritable bowel syndrome and anal fissures. Her last colonoscopy was March 2010 and was unremarkable except for mild diverticulosis. Patient tried on Linzess but it caused significant diarrhea.  Patient comes in today for evaluation of an anal lesion. Patient normally very diligent about bowel regimen and making sure that she gets adequate fluid intake. Last week however she did not consume adequate fluids which led to constipation.   Current Medications, Allergies, Past Medical History, Past Surgical History, Family History and Social History were reviewed in [employer] record.   PHYSICAL EXAMINATION : General:  Well developed  female in no acute distress Head: Normocephalic and atraumatic Eyes:  sclerae anicteric,conjunctive pink. Ears: Normal auditory acuity Neck: Supple, no masses.  Lungs: Clear throughout to auscultation Heart: Regular rate and rhythm Abdomen: Soft, nondistended, nontender. No masses or hepatomegaly noted. Normal bowel sounds Rectal: Inflamed external hemorrhoid, somewhat firm but not hard or blue suggesting thrombosis. DRE. not performed given patient's physical discomfort Musculoskeletal: Symmetrical with no gross deformities  Skin: No lesions on visible extremities Extremities: No edema or deformities noted Neurological: Oriented x 4, grossly nonfocal Cervical Nodes:  No significant cervical adenopathy Psychological:  Pleasant, anxious   ASSESSMENT AND PLAN :   External hemorrhoid. Patient constipated last week, bowel movements now back on track. She is doing sitz baths at home which I have recommended she continue 3-4 times a day over the next several days. In addition, will give her steroid cream to apply externally. Patient has had excision of hemorrhoids in the past, she  strongly prefers not to have surgical intervention again. I explained that with management of constipation and above interventions, hemorrhoids should respond to conservative management. She does have discomfort when exercising/sweating , will try Xylocaine gel as needed.  Patient will call us in a couple weeks if she has not improved

## 2012-03-04 NOTE — Patient Instructions (Addendum)
We sent a prescription for the Pramoxine-hydrocortisone cream. We sent a prescription to have on hold to fill later for the lidocaine cream/gel . We have given you samples of Align probiotic.  Avoid constipation, keep stools soft.   Call us if you have no improvement.

## 2012-03-05 NOTE — Progress Notes (Signed)
Reviewed and agree with management plan. Tomoya Ringwald T. Tyden Kann MD FACG 

## 2012-03-12 ENCOUNTER — Telehealth: Payer: Self-pay | Admitting: Gastroenterology

## 2012-03-12 NOTE — Telephone Encounter (Signed)
Pt would like to f/u her hemorrhoid problem with Dr Jarold Motto. She reports a little improvement, but want to see him. Pt given an appt on 03/19/12.

## 2012-03-19 ENCOUNTER — Ambulatory Visit (INDEPENDENT_AMBULATORY_CARE_PROVIDER_SITE_OTHER): Payer: Medicare Other | Admitting: Gastroenterology

## 2012-03-19 ENCOUNTER — Encounter: Payer: Self-pay | Admitting: Gastroenterology

## 2012-03-19 VITALS — BP 140/80 | HR 80 | Ht 66.0 in | Wt 124.2 lb

## 2012-03-19 DIAGNOSIS — K5900 Constipation, unspecified: Secondary | ICD-10-CM

## 2012-03-19 DIAGNOSIS — K644 Residual hemorrhoidal skin tags: Secondary | ICD-10-CM

## 2012-03-19 DIAGNOSIS — K602 Anal fissure, unspecified: Secondary | ICD-10-CM

## 2012-03-19 DIAGNOSIS — K5909 Other constipation: Secondary | ICD-10-CM

## 2012-03-19 DIAGNOSIS — K6289 Other specified diseases of anus and rectum: Secondary | ICD-10-CM

## 2012-03-19 NOTE — Patient Instructions (Addendum)
Discontinue Diltiazem Jel Discontinue Hydrocortisone Use Triple Paste Cream you can use one to two times a day which can be purchased over the counter

## 2012-03-19 NOTE — Progress Notes (Signed)
This is a 66 year old Caucasian female recently treated for an inflamed external hemorrhoid with topical cortisone ointment. She now has pruritus around her rectum and continued mild constipation. She comes in for followup exam.  Current Medications, Allergies, Past Medical History, Past Surgical History, Family History and Social History were reviewed in [employer] record.  Pertinent Review of Systems Negative   Physical Exam: Healthy-appearing patient in no acute distress. Blood pressure 140/80, pulse 80 and regular, and weight 124 pounds with BMI of 20.05. Abdominal exam shows no distention, organomegaly, masses or tenderness. Specks of rectum shows a very small posterior nodule consistent with a resolving external hemorrhoid. There no fissures or fistulae noted. Anoscopy exam was performed without difficulty and shows a mixed posterior hemorrhoid without other abnormalities, other hemorrhoids, fissures, or mucosal abnormalities.   Assessment and Plan: Chronic functional constipation with recurrent mixed hemorrhoidal problems. She currently has sensitization to topical cortisone therapy, and I have discontinued this medication in placed triple-paste ointment to her perianal regularly 2-3 times a day. She is to discontinue Cortizone, diltiazem, but to continue her medicines for chronic constipation. He has please No diagnosis found.

## 2012-03-25 ENCOUNTER — Telehealth: Payer: Self-pay | Admitting: Gastroenterology

## 2012-03-25 NOTE — Telephone Encounter (Signed)
Per 03/19/12 OV, pt was told to use Triple Paste on her excoriation/rash. She states the rash itself is better, the only tender area is where the hemorrhoid is. She states it's been 7 days, so should she stop the Triple Paste and restart the Diltiazem? Thanks.

## 2012-03-25 NOTE — Telephone Encounter (Signed)
Informed pt no diltiazem and continue the triple paste at night. Pt stated understanding.

## 2012-03-25 NOTE — Telephone Encounter (Signed)
lmom for pt to call back

## 2012-03-25 NOTE — Telephone Encounter (Signed)
No diltazem...triple paste qhs good idea .Marland KitchenMarland Kitchen

## 2012-03-27 ENCOUNTER — Telehealth: Payer: Self-pay | Admitting: Gastroenterology

## 2012-03-27 NOTE — Telephone Encounter (Signed)
yes

## 2012-03-27 NOTE — Telephone Encounter (Signed)
Pt wants Dr Jarold Motto to know she is on Estrace vaginal cream that she places on the outside 3 times/week. Her pharmacist states there is no interaction between that and the Triple paste. She reports she cannot feel the external hemorrhoid now and the only irritation is in her rectum. Dr Luisa Hart put her on the diltiazem to prevent recurrent fissures. She is better, when do you think she might resume the diltiazem? Thanks.

## 2012-03-27 NOTE — Telephone Encounter (Signed)
Left message for pt to call back  °

## 2012-03-27 NOTE — Telephone Encounter (Signed)
It is my opinion that the diltiazem cream sensitized her rectum and she does not need to use this chronically in a prophylactic manner. If she is intent on using creams I would suggest empiric use of the triple paste cream as needed.

## 2012-03-27 NOTE — Telephone Encounter (Signed)
Let me rephrase the question, OK to use the Estrace out of the external vagina area 3 times a week along with the Triple Paste at night?

## 2012-03-28 NOTE — Telephone Encounter (Signed)
Informed pt of Dr Norval Gable response and she stated understanding.

## 2012-04-03 ENCOUNTER — Telehealth: Payer: Self-pay | Admitting: Gastroenterology

## 2012-04-03 NOTE — Telephone Encounter (Signed)
Absolutely okay to use the zinc preparation until Dr. Jarold Motto returns. He can be asked at this time

## 2012-04-03 NOTE — Telephone Encounter (Signed)
Ruth Walker pt has been on Triple Paste for broken area in perineal/rectal area since 03/19/12. Dr Ruth Walker thought using cortisone cream along with daily diltiazem caused the problem. ( Dr Luisa Hart ordered the diltiazem to prevent fissures) she asked how long to stay on the cream and he stated indefinitely. Pt just wants to know can the Triple past cause problems over an extended period; until Dr Ruth Walker returns? Pt reports her bottom is fine now; Triple Paste is mainly Zinc. Thanks.

## 2012-04-04 NOTE — Telephone Encounter (Signed)
Informed pt of Dr Lauro Franklin instructions; pt stated understanding.

## 2012-04-22 ENCOUNTER — Telehealth: Payer: Self-pay | Admitting: Gastroenterology

## 2012-04-22 NOTE — Telephone Encounter (Signed)
Prn use is fine

## 2012-04-22 NOTE — Telephone Encounter (Signed)
Dr Jarold Motto, pt called you were away about this. Is it OK to remain on the Triple Paste, PRN? Thanks.

## 2012-04-23 NOTE — Telephone Encounter (Signed)
Pt stated understanding and will use the Triple cream prn. Left samples of Align at the front desk that she requested.

## 2012-04-25 ENCOUNTER — Other Ambulatory Visit: Payer: Self-pay | Admitting: Cardiovascular Disease

## 2012-05-05 ENCOUNTER — Ambulatory Visit (INDEPENDENT_AMBULATORY_CARE_PROVIDER_SITE_OTHER): Payer: Medicare Other | Admitting: Emergency Medicine

## 2012-05-05 VITALS — BP 144/92 | HR 74 | Temp 98.3°F | Resp 18 | Ht 66.0 in | Wt 125.4 lb

## 2012-05-05 DIAGNOSIS — J40 Bronchitis, not specified as acute or chronic: Secondary | ICD-10-CM

## 2012-05-05 DIAGNOSIS — J018 Other acute sinusitis: Secondary | ICD-10-CM

## 2012-05-05 DIAGNOSIS — J329 Chronic sinusitis, unspecified: Secondary | ICD-10-CM

## 2012-05-05 MED ORDER — PSEUDOEPHEDRINE-GUAIFENESIN ER 60-600 MG PO TB12: 1.0000 | ORAL_TABLET | Freq: Two times a day (BID) | ORAL | Status: DC

## 2012-05-05 MED ORDER — AMOXICILLIN-POT CLAVULANATE 875-125 MG PO TABS: 1.0000 | ORAL_TABLET | Freq: Two times a day (BID) | ORAL | Status: DC

## 2012-05-05 NOTE — Progress Notes (Signed)
[facility] [address] [place] [address] [phone]  Date:  05/05/2012   Name:  Ruth Walker   DOB:  August 30, 1945   MRN:  [id number]  PCP:  Elvina Sidle, MD    Chief Complaint: Sinusitis   History of Present Illness:  Ruth Walker is a 66 y.o. very pleasant female patient who presents with the following:  History of severe allergies.  Has nasal congestion and drainage that is thick and purulent.  Has post nasal drainage and a cough that is worse at night.  Denies fever and chills, wheezing or shortness of breath, nausea or vomiting.  No improvement with OTC meds.  Patient Active Problem List  Diagnosis  . OBSESSIVE-COMPULSIVE PERSONALITY DISORDER  . HYPERTENSION  . HEMORRHOIDS  . ASTHMA  . IRRITABLE BOWEL SYNDROME  . ANAL FISSURE  . ANAL OR RECTAL PAIN  . CYSTITIS, CHRONIC  . TACHYCARDIA  . Diverticulosis  . Gallstones  . IBS (irritable bowel syndrome)  . Constipation, slow transit  . Anxiety disorder  . DVT (deep venous thrombosis)  . Uterine polyp  . Osteoporosis  . Vertebral compression fracture  . External hemorrhoids    Past Medical History  Diagnosis Date  . Hemorrhoids   . Irritable bowel syndrome (IBS)   . Asthma     Gets allergy shots once a week   . Skin cancer     Leg  . Cholelithiasis   . Diverticulosis   . Anal fissure   . Anxiety states   . Uterine polyp   . Osteoporosis   . Vertebral compression fracture   . Environmental allergies   . Allergy   . Depression     Past Surgical History  Procedure Date  . Cesarean section   . Hemorrhoid surgery   . Back surgery   . Skin cancer excision     Leg   . Endometrial polypectomy   . Hysteroscopy   . Dilation and curettage of uterus   . Mouth surgery     History  Substance Use Topics  . Smoking status: Never Smoker   . Smokeless tobacco: Never Used  . Alcohol Use: No    Family History  Problem Relation Age of Onset  . Diabetes Mother   .  Hypertension Mother   . Colon cancer Neg Hx   . Hypertension Father   . Heart disease Father     Allergies  Allergen Reactions  . Aloe   . Cephalexin     REACTION: coarse rash  . Ciprofloxacin   . Shellfish Allergy   . Sulfonamide Derivatives     Medication list has been reviewed and updated.  Current Outpatient Prescriptions on File Prior to Visit  Medication Sig Dispense Refill  . aspirin 81 MG tablet Take 81 mg by mouth daily.        . Calcium Carbonate-Vit D-Min (CALCIUM 1200) 1200-1000 MG-UNIT CHEW Chew 1 tablet by mouth daily.        . cycloSPORINE (RESTASIS) 0.05 % ophthalmic emulsion 1 drop 2 (two) times daily.        Marland Kitchen estradiol (ESTRACE VAGINAL) 0.1 MG/GM vaginal cream Place vaginally. As directed       . hydrochlorothiazide (HYDRODIURIL) 25 MG tablet take 1 tablet by mouth once daily  30 tablet  9  . Hyoscyamine-Phenyltoloxamine (DIGEX NF) 9.5284-13 MG CAPS Take 1 capsule by mouth 2 (two) times daily.  60 each  3  . lidocaine-hydrocortisone (ANAMANTEL HC) 3-0.5 %  CREA Place 1 Applicatorful rectally 2 (two) times daily.  28.35 g  0  . loratadine (CLARITIN) 10 MG tablet Take 10 mg by mouth daily.      . metoprolol tartrate (LOPRESSOR) 25 MG tablet take 1 tablet by mouth twice a day  60 tablet  11  . polyethylene glycol (MIRALAX / GLYCOLAX) packet Take 17 g by mouth daily. dissolve in 8 oz. Of water at bedtime       . trimethoprim (TRIMPEX) 100 MG tablet Take 100 mg by mouth daily.        . Vitamin D, Ergocalciferol, (DRISDOL) 50000 UNITS CAPS take 1 capsule by mouth every OTHER WEEK  5 capsule  5  . Wheat Dextrin (BENEFIBER DRINK MIX PO) Take 10 mLs by mouth every morning. Take 2 teaspoons every morning        . AMBULATORY NON FORMULARY MEDICATION Place 15 g rectally 4 (four) times daily. Diltiazem 2% gel apply to rectum qid  15 g  1  . PRESCRIPTION MEDICATION Allergy injection once a week.Marland KitchenMarland KitchenLeB[facility]      . Probiotic Product (ALIGN) 4 MG CAPS Take 1  capsule by mouth daily.  21 capsule  0  . DISCONTD: Linaclotide (LINZESS) 145 MCG CAPS Take 1 capsule by mouth daily.  8 capsule  0    Review of Systems:  As per HPI, otherwise negative.    Physical Examination: Filed Vitals:   05/05/12 1445  BP: 144/92  Pulse: 74  Temp: 98.3 F (36.8 C)  Resp: 18   Filed Vitals:   05/05/12 1445  Height: 5\' 6"  (1.676 m)  Weight: 125 lb 6.4 oz (56.881 kg)   Body mass index is 20.24 kg/(m^2). Ideal Body Weight: Weight in (lb) to have BMI = 25: 154.6   GEN: WDWN, NAD, Non-toxic, A & O x 3 HEENT: Atraumatic, Normocephalic. Neck supple. No masses, No LAD.  Oropharynx negative Ears and Nose: No external deformity.  Nasal congestion and purulent drainage CV: RRR, No M/G/R. No JVD. No thrill. No extra heart sounds. PULM: CTA B, no wheezes, crackles, rhonchi. No retractions. No resp. distress. No accessory muscle use. ABD: S, NT, ND, +BS. No rebound. No HSM. EXTR: No c/c/e NEURO Normal gait.  PSYCH: Normally interactive. Conversant. Not depressed or anxious appearing.  Calm demeanor.    Assessment and Plan: Sinusitis Seasonal allergic rhinitis augmentin mucinex  Carmelina Dane, MD

## 2012-05-13 NOTE — Progress Notes (Signed)
Reviewed and agree.

## 2012-05-22 ENCOUNTER — Encounter: Payer: Self-pay | Admitting: Physician Assistant

## 2012-05-31 ENCOUNTER — Ambulatory Visit (INDEPENDENT_AMBULATORY_CARE_PROVIDER_SITE_OTHER): Payer: Medicare Other | Admitting: Internal Medicine

## 2012-05-31 ENCOUNTER — Encounter: Payer: Self-pay | Admitting: Internal Medicine

## 2012-05-31 VITALS — BP 124/76 | HR 99 | Temp 98.1°F | Resp 16 | Ht 66.5 in | Wt 128.0 lb

## 2012-05-31 DIAGNOSIS — S335XXA Sprain of ligaments of lumbar spine, initial encounter: Secondary | ICD-10-CM

## 2012-05-31 DIAGNOSIS — J329 Chronic sinusitis, unspecified: Secondary | ICD-10-CM

## 2012-05-31 DIAGNOSIS — R21 Rash and other nonspecific skin eruption: Secondary | ICD-10-CM

## 2012-05-31 DIAGNOSIS — Z7189 Other specified counseling: Secondary | ICD-10-CM

## 2012-05-31 DIAGNOSIS — S39012A Strain of muscle, fascia and tendon of lower back, initial encounter: Secondary | ICD-10-CM

## 2012-05-31 MED ORDER — METHOCARBAMOL 750 MG PO TABS: 750.0000 mg | ORAL_TABLET | Freq: Four times a day (QID) | ORAL | Status: DC

## 2012-05-31 MED ORDER — AMOXICILLIN 500 MG PO CAPS: 1000.0000 mg | ORAL_CAPSULE | Freq: Two times a day (BID) | ORAL | Status: DC

## 2012-05-31 MED ORDER — FLUTICASONE PROPIONATE 50 MCG/ACT NA SUSP: 2.0000 | Freq: Every day | NASAL | Status: DC

## 2012-05-31 NOTE — Progress Notes (Signed)
  Subjective:    Patient ID: Ruth Walker, female    DOB: [birth year], 66 y.o.   MRN: [id number]  HPI Facial pain, yellow discharge, mild ha for 1 week. Used augmentin last time with success. Itchy rash right abdomen, dry skin. Back strain from tying her shoes, sudden onset. No radiation , weakness, numbness Counseled on osteoporosis tx.   Review of Systems     Objective:   Physical Exam  Constitutional: She is oriented to person, place, and time. She appears well-developed and well-nourished. No distress.  HENT:  Right Ear: External ear normal.  Left Ear: External ear normal.  Nose: Mucosal edema, rhinorrhea and sinus tenderness present. Right sinus exhibits maxillary sinus tenderness and frontal sinus tenderness. Left sinus exhibits maxillary sinus tenderness and frontal sinus tenderness.  Mouth/Throat: Oropharynx is clear and moist.  Eyes: EOM are normal.  Neck: Neck supple. No thyromegaly present.  Cardiovascular: Normal rate, regular rhythm and normal heart sounds.   Pulmonary/Chest: Effort normal and breath sounds normal.  Musculoskeletal: She exhibits tenderness.       Lumbar back: She exhibits tenderness, pain and spasm.  Neurological: She is alert and oriented to person, place, and time. She exhibits normal muscle tone. Coordination normal.  Skin: Rash noted.  Psychiatric: She has a normal mood and affect.          Assessment & Plan:  Amoxil/Robaxin/fluticasone nasal/ 1% hc cream.

## 2012-05-31 NOTE — Patient Instructions (Addendum)
Sinusitis Sinusitis is redness, soreness, and swelling (inflammation) of the paranasal sinuses. Paranasal sinuses are air pockets within the bones of your face (beneath the eyes, the middle of the forehead, or above the eyes). In healthy paranasal sinuses, mucus is able to drain out, and air is able to circulate through them by way of your nose. However, when your paranasal sinuses are inflamed, mucus and air can become trapped. This can allow bacteria and other germs to grow and cause infection. Sinusitis can develop quickly and last only a short time (acute) or continue over a long period (chronic). Sinusitis that lasts for more than 12 weeks is considered chronic.  CAUSES  Causes of sinusitis include:  Allergies.  Structural abnormalities, such as displacement of the cartilage that separates your nostrils (deviated septum), which can decrease the air flow through your nose and sinuses and affect sinus drainage.  Functional abnormalities, such as when the small hairs (cilia) that line your sinuses and help remove mucus do not work properly or are not present. SYMPTOMS  Symptoms of acute and chronic sinusitis are the same. The primary symptoms are pain and pressure around the affected sinuses. Other symptoms include:  Upper toothache.  Earache.  Headache.  Bad breath.  Decreased sense of smell and taste.  A cough, which worsens when you are lying flat.  Fatigue.  Fever.  Thick drainage from your nose, which often is green and may contain pus (purulent).  Swelling and warmth over the affected sinuses. DIAGNOSIS  Your caregiver will perform a physical exam. During the exam, your caregiver may:  Look in your nose for signs of abnormal growths in your nostrils (nasal polyps).  Tap over the affected sinus to check for signs of infection.  View the inside of your sinuses (endoscopy) with a special imaging device with a light attached (endoscope), which is inserted into your  sinuses. If your caregiver suspects that you have chronic sinusitis, one or more of the following tests may be recommended:  Allergy tests.  Nasal culture A sample of mucus is taken from your nose and sent to a lab and screened for bacteria.  Nasal cytology A sample of mucus is taken from your nose and examined by your caregiver to determine if your sinusitis is related to an allergy. TREATMENT  Most cases of acute sinusitis are related to a viral infection and will resolve on their own within 10 days. Sometimes medicines are prescribed to help relieve symptoms (pain medicine, decongestants, nasal steroid sprays, or saline sprays).  However, for sinusitis related to a bacterial infection, your caregiver will prescribe antibiotic medicines. These are medicines that will help kill the bacteria causing the infection.  Rarely, sinusitis is caused by a fungal infection. In theses cases, your caregiver will prescribe antifungal medicine. For some cases of chronic sinusitis, surgery is needed. Generally, these are cases in which sinusitis recurs more than 3 times per year, despite other treatments. HOME CARE INSTRUCTIONS   Drink plenty of water. Water helps thin the mucus so your sinuses can drain more easily.  Use a humidifier.  Inhale steam 3 to 4 times a day (for example, sit in the bathroom with the shower running).  Apply a warm, moist washcloth to your face 3 to 4 times a day, or as directed by your caregiver.  Use saline nasal sprays to help moisten and clean your sinuses.  Take over-the-counter or prescription medicines for pain, discomfort, or fever only as directed by your caregiver. SEEK IMMEDIATE MEDICAL   CARE IF:  You have increasing pain or severe headaches.  You have nausea, vomiting, or drowsiness.  You have swelling around your face.  You have vision problems.  You have a stiff neck.  You have difficulty breathing. MAKE SURE YOU:   Understand these  instructions.  Will watch your condition.  Will get help right away if you are not doing well or get worse. Document Released: 06/26/2005 Document Revised: 09/18/2011 Document Reviewed: 07/11/2011 [facility] Patient Information 2013 [place], [place]. Zoledronic Acid injection (Paget's Disease, Osteoporosis) What is this medicine? ZOLEDRONIC ACID (ZOE le dron ik AS id) lowers the amount of calcium loss from bone. It is used to treat Paget's disease and osteoporosis in women. This medicine may be used for other purposes; ask your health care provider or pharmacist if you have questions. What should I tell my health care provider before I take this medicine? They need to know if you have any of these conditions: -aspirin-sensitive asthma -dental disease -kidney disease -low levels of calcium in the blood -past surgery on the parathyroid gland or intestines -an unusual or allergic reaction to zoledronic acid, other medicines, foods, dyes, or preservatives -pregnant or trying to get pregnant -breast-feeding How should I use this medicine? This medicine is for infusion into a vein. It is given by a health care professional in a hospital or clinic setting. Talk to your pediatrician regarding the use of this medicine in children. This medicine is not approved for use in children. Overdosage: If you think you have taken too much of this medicine contact a poison control center or emergency room at once. NOTE: This medicine is only for you. Do not share this medicine with others. What if I miss a dose? It is important not to miss your dose. Call your doctor or health care professional if you are unable to keep an appointment. What may interact with this medicine? -certain antibiotics given by injection -NSAIDs, medicines for pain and inflammation, like ibuprofen or naproxen -some diuretics like bumetanide, furosemide -teriparatide This list may not describe all possible interactions. Give your  health care provider a list of all the medicines, herbs, non-prescription drugs, or dietary supplements you use. Also tell them if you smoke, drink alcohol, or use illegal drugs. Some items may interact with your medicine. What should I watch for while using this medicine? Visit your doctor or health care professional for regular checkups. It may be some time before you see the benefit from this medicine. Do not stop taking your medicine unless your doctor tells you to. Your doctor may order blood tests or other tests to see how you are doing. Women should inform their doctor if they wish to become pregnant or think they might be pregnant. There is a potential for serious side effects to an unborn child. Talk to your health care professional or pharmacist for more information. You should make sure that you get enough calcium and vitamin D while you are taking this medicine. Discuss the foods you eat and the vitamins you take with your health care professional. Some people who take this medicine have severe bone, joint, and/or muscle pain. This medicine may also increase your risk for a broken thigh bone. Tell your doctor right away if you have pain in your upper leg or groin. Tell your doctor if you have any pain that does not go away or that gets worse. What side effects may I notice from receiving this medicine? Side effects that you should report to  your doctor or health care professional as soon as possible: -allergic reactions like skin rash, itching or hives, swelling of the face, lips, or tongue -breathing problems -changes in vision -feeling faint or lightheaded, falls -jaw burning, cramping, or pain -muscle cramps, stiffness, or weakness -trouble passing urine or change in the amount of urine Side effects that usually do not require medical attention (report to your doctor or health care professional if they continue or are bothersome): -bone, joint, or muscle pain -fever -irritation at  site where injected -loss of appetite -nausea, vomiting -stomach upset -tired This list may not describe all possible side effects. Call your doctor for medical advice about side effects. You may report side effects to FDA at 1-800-FDA-1088. Where should I keep my medicine? This drug is given in a hospital or clinic and will not be stored at home. NOTE: This sheet is a summary. It may not cover all possible information. If you have questions about this medicine, talk to your doctor, pharmacist, or health care provider.  2012, [employer]. (12/23/2010 9:08:15 AM)

## 2012-06-24 ENCOUNTER — Ambulatory Visit (INDEPENDENT_AMBULATORY_CARE_PROVIDER_SITE_OTHER): Payer: Medicare Other | Admitting: Family Medicine

## 2012-06-24 VITALS — BP 159/68 | HR 76 | Temp 98.2°F | Resp 16 | Ht 65.5 in | Wt 130.0 lb

## 2012-06-24 DIAGNOSIS — J329 Chronic sinusitis, unspecified: Secondary | ICD-10-CM

## 2012-06-24 DIAGNOSIS — J209 Acute bronchitis, unspecified: Secondary | ICD-10-CM

## 2012-06-24 MED ORDER — METHYLPREDNISOLONE ACETATE 80 MG/ML IJ SUSP
80.0000 mg | Freq: Once | INTRAMUSCULAR | Status: AC
  Administered 2012-06-24: 80 mg via INTRAMUSCULAR

## 2012-06-24 MED ORDER — AMOXICILLIN 875 MG PO TABS: 875.0000 mg | ORAL_TABLET | Freq: Three times a day (TID) | ORAL | Status: DC

## 2012-06-24 MED ORDER — IPRATROPIUM BROMIDE 0.03 % NA SOLN: 2.0000 | Freq: Two times a day (BID) | NASAL | Status: DC

## 2012-06-24 NOTE — Progress Notes (Signed)
Subjective:    Patient ID: Ruth Walker, female    DOB: [birth year], 66 y.o.   MRN: [id number] Chief Complaint  Patient presents with  . Sinusitis    Post nasal drip; ear popping ; trouble hearing  . Cough    thick post nasal drip    HPI  Ruth Walker a a 66 yo woman who presents with worsening sinus congestion and illness.  She reports that her allergies are very severe and she is supposed to go for [facility] for allergy shots at [facility] but unable to get there very freq as watches 3 grandchildren.  Also because of this, she is frequently exposed to many illnesses (caught this bug from the 65 yo she suspects). Her current illness began about 3d prev with popping in ears and decreased hearing.  No f/c.  Little sinus pressure but sig PND and gagging. Pharyngitis and slight cough.  Ear pressure. No eye sxs and no HAs. Flonase causes nasal dryness and nose bleeds but usu does better with saline soluation  No current IBS flairs.  Occ h/o thrombosed hemorrhoids  Has white coat HTN - followed by Dr. Danella Deis and has seen Dr. Eden Emms. Has BP cuff at home but doesn't use. Past Medical History  Diagnosis Date  . Hemorrhoids   . Irritable bowel syndrome (IBS)   . Asthma     Gets allergy shots once a week   . Skin cancer     Leg  . Cholelithiasis   . Diverticulosis   . Anal fissure   . Anxiety states   . Uterine polyp   . Osteoporosis   . Vertebral compression fracture   . Environmental allergies   . Allergy   . Depression    Current Outpatient Prescriptions on File Prior to Visit  Medication Sig Dispense Refill  . aspirin 81 MG tablet Take 81 mg by mouth daily.        . Calcium Carbonate-Vit D-Min (CALCIUM 1200) 1200-1000 MG-UNIT CHEW Chew 1 tablet by mouth daily.        . cycloSPORINE (RESTASIS) 0.05 % ophthalmic emulsion 1 drop 2 (two) times daily.        Marland Kitchen estradiol (ESTRACE VAGINAL) 0.1 MG/GM vaginal cream Place vaginally. As directed       . hydrochlorothiazide (HYDRODIURIL) 25 MG  tablet take 1 tablet by mouth once daily  30 tablet  9  . Hyoscyamine-Phenyltoloxamine (DIGEX NF) 4.0981-19 MG CAPS Take 1 capsule by mouth 2 (two) times daily.  60 each  3  . metoprolol tartrate (LOPRESSOR) 25 MG tablet take 1 tablet by mouth twice a day  60 tablet  11  . polyethylene glycol (MIRALAX / GLYCOLAX) packet Take 17 g by mouth daily. dissolve in 8 oz. Of water at bedtime       . PRESCRIPTION MEDICATION Allergy injection once a week.[facility]      . Probiotic Product (ALIGN) 4 MG CAPS Take 1 capsule by mouth daily.  21 capsule  0  . trimethoprim (TRIMPEX) 100 MG tablet Take 100 mg by mouth daily.        . Vitamin D, Ergocalciferol, (DRISDOL) 50000 UNITS CAPS take 1 capsule by mouth every OTHER WEEK  5 capsule  5  . Wheat Dextrin (BENEFIBER DRINK MIX PO) Take 10 mLs by mouth every morning. Take 2 teaspoons every morning        . AMBULATORY NON FORMULARY MEDICATION Place 15 g rectally 4 (four) times daily. Diltiazem 2% gel apply  to rectum qid  15 g  1  . fluticasone (FLONASE) 50 MCG/ACT nasal spray Place 2 sprays into the nose daily.  16 g  6        . lidocaine-hydrocortisone (ANAMANTEL HC) 3-0.5 % CREA Place 1 Applicatorful rectally 2 (two) times daily.  28.35 g  0  . loratadine (CLARITIN) 10 MG tablet Take 10 mg by mouth daily.      . methocarbamol (ROBAXIN-750) 750 MG tablet Take 1 tablet (750 mg total) by mouth 4 (four) times daily.  40 tablet  1  . pseudoephedrine-guaifenesin (MUCINEX D) 60-600 MG per tablet Take 1 tablet by mouth every 12 (twelve) hours.  18 tablet  0  . [DISCONTINUED] Linaclotide (LINZESS) 145 MCG CAPS Take 1 capsule by mouth daily.  8 capsule  0    Review of Systems  Constitutional: Positive for fatigue. Negative for fever and chills.  HENT: Positive for hearing loss, ear pain, nosebleeds, congestion, sore throat, rhinorrhea, sneezing, trouble swallowing, postnasal drip and sinus pressure.   Eyes: Negative for discharge, redness and  itching.  Respiratory: Positive for cough. Negative for wheezing.   Cardiovascular: Negative for chest pain and palpitations.  Gastrointestinal: Positive for diarrhea and constipation. Negative for blood in stool and anal bleeding.  Neurological: Negative for headaches.  Psychiatric/Behavioral: Positive for sleep disturbance.      BP 159/68  Pulse 76  Temp 98.2 F (36.8 C) (Oral)  Resp 16  Ht 5' 5.5" (1.664 m)  Wt 130 lb (58.968 kg)  BMI 21.30 kg/m2  SpO2 100% Objective:   Physical Exam  Constitutional: She is oriented to person, place, and time. She appears well-developed and well-nourished. She appears lethargic. She appears ill. No distress.  HENT:  Head: Normocephalic and atraumatic.  Right Ear: External ear and ear canal normal. Tympanic membrane is retracted. A middle ear effusion is present.  Left Ear: External ear and ear canal normal. Tympanic membrane is retracted. A middle ear effusion is present.  Nose: Mucosal edema and rhinorrhea present. Right sinus exhibits maxillary sinus tenderness. Left sinus exhibits maxillary sinus tenderness.  Mouth/Throat: Uvula is midline and mucous membranes are normal. Posterior oropharyngeal erythema present. No oropharyngeal exudate, posterior oropharyngeal edema or tonsillar abscesses.  Eyes: Conjunctivae normal are normal. Right eye exhibits no discharge. Left eye exhibits no discharge. No scleral icterus.  Neck: Normal range of motion. Neck supple.  Cardiovascular: Normal rate, regular rhythm, normal heart sounds and intact distal pulses.   Pulmonary/Chest: Effort normal and breath sounds normal.  Lymphadenopathy:       Head (right side): Submandibular adenopathy present. No preauricular and no posterior auricular adenopathy present.       Head (left side): Submandibular adenopathy present. No preauricular and no posterior auricular adenopathy present.    She has no cervical adenopathy.       Right: No supraclavicular adenopathy  present.       Left: No supraclavicular adenopathy present.  Neurological: She is oriented to person, place, and time. She appears lethargic.  Skin: Skin is warm and dry. She is not diaphoretic. No erythema.  Psychiatric: She has a normal mood and affect. Her behavior is normal.          Assessment & Plan:   1. Acute bronchitis  methylPREDNISolone acetate (DEPO-MEDROL) injection 80 mg IM x 1 now  2. Sinusitis  amoxicillin (AMOXIL) 875 MG tablet, ipratropium (ATROVENT) 0.03 % nasal spray

## 2012-08-16 ENCOUNTER — Ambulatory Visit (INDEPENDENT_AMBULATORY_CARE_PROVIDER_SITE_OTHER): Payer: Medicare Other | Admitting: Family Medicine

## 2012-08-16 VITALS — BP 160/90 | HR 68 | Temp 97.9°F | Resp 16 | Ht 66.0 in | Wt 134.2 lb

## 2012-08-16 DIAGNOSIS — H6980 Other specified disorders of Eustachian tube, unspecified ear: Secondary | ICD-10-CM

## 2012-08-16 DIAGNOSIS — J029 Acute pharyngitis, unspecified: Secondary | ICD-10-CM

## 2012-08-16 DIAGNOSIS — J069 Acute upper respiratory infection, unspecified: Secondary | ICD-10-CM

## 2012-08-16 LAB — POCT RAPID STREP A (OFFICE): Rapid Strep A Screen: NEGATIVE

## 2012-08-16 MED ORDER — AZITHROMYCIN 250 MG PO TABS: ORAL_TABLET | ORAL | Status: DC

## 2012-08-16 NOTE — Progress Notes (Signed)
Subjective:    Patient ID: Ruth Walker, female    DOB: 1946-06-08, 67 y.o.   MRN: [id number]  HPI Ruth Walker is a 67 y.o. female Sore throat and drainage and popping in L ear.  Started few days ago. No fever. No discharge, hearing ok.  Takes care of 3 grandchildren - they have had uri sx's recently.   Tx: none yet.   Flu vaccine in October last year.   Hx of bronchiits requiring abx last year.   Review of Systems  Constitutional: Negative for fever and chills.  HENT: Positive for congestion, sore throat, rhinorrhea and postnasal drip. Negative for ear discharge.   Respiratory: Positive for cough (with pnd only. ). Negative for shortness of breath.   Musculoskeletal: Negative for myalgias and arthralgias.       Objective:   Physical Exam  Vitals reviewed. Constitutional: She is oriented to person, place, and time. She appears well-developed and well-nourished. No distress.  HENT:  Head: Normocephalic and atraumatic.  Right Ear: Hearing, tympanic membrane, external ear and ear canal normal.  Left Ear: Hearing, tympanic membrane, external ear and ear canal normal.  Nose: Nose normal.  Mouth/Throat: Oropharynx is clear and moist. No oropharyngeal exudate.  Eyes: Conjunctivae normal and EOM are normal. Pupils are equal, round, and reactive to light.  Cardiovascular: Normal rate, regular rhythm, normal heart sounds and intact distal pulses.   No murmur heard. Pulmonary/Chest: Effort normal and breath sounds normal. No respiratory distress. She has no wheezes. She has no rhonchi.  Neurological: She is alert and oriented to person, place, and time.  Skin: Skin is warm and dry. No rash noted.  Psychiatric: She has a normal mood and affect. Her behavior is normal.   Results for orders placed in visit on 08/16/12  POCT RAPID STREP A (OFFICE)      Component Value Range   Rapid Strep A Screen Negative  Negative       Assessment & Plan:  Ruth Walker is a 67 y.o.  female 1. Sore throat  POCT rapid strep A  2. Upper respiratory infection  POCT rapid strep A  3. ETD (eustachian tube dysfunction)     Likely viral URI with ETD. Sx care discussed, trial of slaine ns, atrovent ns, and if not improving in 1 week - z pak.  rtc precautions. All questions answered.   Patient Instructions  Your symptoms appear to be due to a virus at this point. Saline nasal spray atleast 4 times per day for congestion, over the counter mucinex or mucinex DM for cough, lozenges for sore throat, drink plenty of fluids. If cough or chest congestion not improving in the next week - can fill antibiotic.  Return to the clinic or go to the nearest emergency room if any of your symptoms worsen or new symptoms occur. The popping in your ear is likely from the congestion in your nose and sinuses. Saline nasal spray as above, and atrovent nasal spray can sometimes help this.  See more info below.   Barotitis Media Barotitis media is soreness (inflammation) of the area behind the eardrum (middle ear). This occurs when the auditory tube (Eustachian tube) leading from the back of the throat to the eardrum is blocked. When it is blocked air cannot move in and out of the middle ear to equalize pressure changes. These pressure changes come from changes in altitude when:  Flying.  Driving in the mountains.  Diving. Problems are more likely to  occur with pressure changes during times when you are congested as from:  Hay fever.  Upper respiratory infection.  A cold. Damage or hearing loss (barotrauma) caused by this may be permanent. HOME CARE INSTRUCTIONS   Use medicines as recommended by your caregiver. Over the counter medicines will help unblock the canal and can help during times of air travel.  Do not put anything into your ears to clean or unplug them. Eardrops will not be helpful.  Do not swim, dive, or fly until your caregiver says it is all right to do so. If these activities  are necessary, chewing gum with frequent swallowing may help. It is also helpful to hold your nose and gently blow to pop your ears for equalizing pressure changes. This forces air into the Eustachian tube.  For little ones with problems, give your baby a bottle of water or juice during periods when pressure changes would be anticipated such as during take offs and landings associated with air travel.  Only take over-the-counter or prescription medicines for pain, discomfort, or fever as directed by your caregiver.  A decongestant may be helpful in de-congesting the middle ear and make pressure equalization easier. This can be even more effective if the drops (spray) are delivered with the head lying over the edge of a bed with the head tilted toward the ear on the affected side.  If your caregiver has given you a follow-up appointment, it is very important to keep that appointment. Not keeping the appointment could result in a chronic or permanent injury, pain, hearing loss and disability. If there is any problem keeping the appointment, you must call back to this facility for assistance. SEEK IMMEDIATE MEDICAL CARE IF:   You develop a severe headache, dizziness, severe ear pain, or bloody or pus-like drainage from your ears.  An oral temperature above 102 F (38.9 C) develops.  Your problems do not improve or become worse. MAKE SURE YOU:   Understand these instructions.  Will watch your condition.  Will get help right away if you are not doing well or get worse. Document Released: 06/23/2000 Document Revised: 09/18/2011 Document Reviewed: 01/30/2008 [facility] Patient Information 2013 [place], [place].   Upper Respiratory Infection, Adult An upper respiratory infection (URI) is also known as the common cold. It is often caused by a type of germ (virus). Colds are easily spread (contagious). You can pass it to others by kissing, coughing, sneezing, or drinking out of the same glass.  Usually, you get better in 1 or 2 weeks.  HOME CARE   Only take medicine as told by your doctor.  Use a warm mist humidifier or breathe in steam from a hot shower.  Drink enough water and fluids to keep your pee (urine) clear or pale yellow.  Get plenty of rest.  Return to work when your temperature is back to normal or as told by your doctor. You may use a face mask and wash your hands to stop your cold from spreading. GET HELP RIGHT AWAY IF:   After the first few days, you feel you are getting worse.  You have questions about your medicine.  You have chills, shortness of breath, or brown or red spit (mucus).  You have yellow or brown snot (nasal discharge) or pain in the face, especially when you bend forward.  You have a fever, puffy (swollen) neck, pain when you swallow, or white spots in the back of your throat.  You have a bad headache, ear  pain, sinus pain, or chest pain.  You have a high-pitched whistling sound when you breathe in and out (wheezing).  You have a lasting cough or cough up blood.  You have sore muscles or a stiff neck. MAKE SURE YOU:   Understand these instructions.  Will watch your condition.  Will get help right away if you are not doing well or get worse. Document Released: 12/13/2007 Document Revised: 09/18/2011 Document Reviewed: 10/31/2010 [facility] Patient Information 2013 [place], [place].

## 2012-08-16 NOTE — Patient Instructions (Addendum)
Your symptoms appear to be due to a virus at this point. Saline nasal spray atleast 4 times per day for congestion, over the counter mucinex or mucinex DM for cough, lozenges for sore throat, drink plenty of fluids. If cough or chest congestion not improving in the next week - can fill antibiotic.  Return to the clinic or go to the nearest emergency room if any of your symptoms worsen or new symptoms occur. The popping in your ear is likely from the congestion in your nose and sinuses. Saline nasal spray as above, and atrovent nasal spray can sometimes help this.  See more info below.   Barotitis Media Barotitis media is soreness (inflammation) of the area behind the eardrum (middle ear). This occurs when the auditory tube (Eustachian tube) leading from the back of the throat to the eardrum is blocked. When it is blocked air cannot move in and out of the middle ear to equalize pressure changes. These pressure changes come from changes in altitude when:  Flying.  Driving in the mountains.  Diving. Problems are more likely to occur with pressure changes during times when you are congested as from:  Hay fever.  Upper respiratory infection.  A cold. Damage or hearing loss (barotrauma) caused by this may be permanent. HOME CARE INSTRUCTIONS   Use medicines as recommended by your caregiver. Over the counter medicines will help unblock the canal and can help during times of air travel.  Do not put anything into your ears to clean or unplug them. Eardrops will not be helpful.  Do not swim, dive, or fly until your caregiver says it is all right to do so. If these activities are necessary, chewing gum with frequent swallowing may help. It is also helpful to hold your nose and gently blow to pop your ears for equalizing pressure changes. This forces air into the Eustachian tube.  For little ones with problems, give your baby a bottle of water or juice during periods when pressure changes would be  anticipated such as during take offs and landings associated with air travel.  Only take over-the-counter or prescription medicines for pain, discomfort, or fever as directed by your caregiver.  A decongestant may be helpful in de-congesting the middle ear and make pressure equalization easier. This can be even more effective if the drops (spray) are delivered with the head lying over the edge of a bed with the head tilted toward the ear on the affected side.  If your caregiver has given you a follow-up appointment, it is very important to keep that appointment. Not keeping the appointment could result in a chronic or permanent injury, pain, hearing loss and disability. If there is any problem keeping the appointment, you must call back to this facility for assistance. SEEK IMMEDIATE MEDICAL CARE IF:   You develop a severe headache, dizziness, severe ear pain, or bloody or pus-like drainage from your ears.  An oral temperature above 102 F (38.9 C) develops.  Your problems do not improve or become worse. MAKE SURE YOU:   Understand these instructions.  Will watch your condition.  Will get help right away if you are not doing well or get worse. Document Released: 06/23/2000 Document Revised: 09/18/2011 Document Reviewed: 01/30/2008 [facility] Patient Information 2013 [place], [place].   Upper Respiratory Infection, Adult An upper respiratory infection (URI) is also known as the common cold. It is often caused by a type of germ (virus). Colds are easily spread (contagious). You can pass it to  others by kissing, coughing, sneezing, or drinking out of the same glass. Usually, you get better in 1 or 2 weeks.  HOME CARE   Only take medicine as told by your doctor.  Use a warm mist humidifier or breathe in steam from a hot shower.  Drink enough water and fluids to keep your pee (urine) clear or pale yellow.  Get plenty of rest.  Return to work when your temperature is back to normal or as  told by your doctor. You may use a face mask and wash your hands to stop your cold from spreading. GET HELP RIGHT AWAY IF:   After the first few days, you feel you are getting worse.  You have questions about your medicine.  You have chills, shortness of breath, or brown or red spit (mucus).  You have yellow or brown snot (nasal discharge) or pain in the face, especially when you bend forward.  You have a fever, puffy (swollen) neck, pain when you swallow, or white spots in the back of your throat.  You have a bad headache, ear pain, sinus pain, or chest pain.  You have a high-pitched whistling sound when you breathe in and out (wheezing).  You have a lasting cough or cough up blood.  You have sore muscles or a stiff neck. MAKE SURE YOU:   Understand these instructions.  Will watch your condition.  Will get help right away if you are not doing well or get worse. Document Released: 12/13/2007 Document Revised: 09/18/2011 Document Reviewed: 10/31/2010 [facility] Patient Information 2013 [place], [place].

## 2012-08-27 ENCOUNTER — Other Ambulatory Visit: Payer: Self-pay | Admitting: Obstetrics and Gynecology

## 2012-09-09 ENCOUNTER — Ambulatory Visit (INDEPENDENT_AMBULATORY_CARE_PROVIDER_SITE_OTHER): Payer: Medicare Other | Admitting: Gynecology

## 2012-09-09 ENCOUNTER — Encounter: Payer: Self-pay | Admitting: Gynecology

## 2012-09-09 DIAGNOSIS — N764 Abscess of vulva: Secondary | ICD-10-CM

## 2012-09-09 NOTE — Progress Notes (Signed)
Patient presents several day history of right labial swelling and discomfort. She does have a history of a boil previously a number of years ago and feels it's the same thing.  Exam with Kim assistant Pelvic external BUS vagina with BB size mass right lower labia majora lateral to the perineal body with minimal surrounding skin swelling. No erythema or evidence of cellulitis. No inguinal adenopathy. Generalized atrophic changes noted. Vagina normal. Cervix grossly normal. Uterus normal size midline mobile nontender. Adnexa without masses or tenderness.  Assessment and plan: Small right lower labial boil, non-pointing not fluctuant. Recommended heat and follow at present. His lowest results will follow. If it enlarges and becomes pointing we'll lance. May consider antibiotics if it lingers.

## 2012-09-09 NOTE — Patient Instructions (Signed)
Use warm sitz baths for now. If it lingers over the next week or so but me know we'll consider oral antibiotics. It enlarges and starts to point I will consider lancing it.

## 2012-09-26 ENCOUNTER — Ambulatory Visit (INDEPENDENT_AMBULATORY_CARE_PROVIDER_SITE_OTHER): Payer: Medicare Other | Admitting: Internal Medicine

## 2012-09-26 VITALS — BP 138/86 | HR 86 | Temp 98.5°F | Resp 16 | Ht 66.0 in | Wt 134.0 lb

## 2012-09-26 DIAGNOSIS — J329 Chronic sinusitis, unspecified: Secondary | ICD-10-CM

## 2012-09-26 MED ORDER — AMOXICILLIN 500 MG PO CAPS: 1000.0000 mg | ORAL_CAPSULE | Freq: Two times a day (BID) | ORAL | Status: DC

## 2012-09-26 NOTE — Progress Notes (Signed)
  Subjective:    Patient ID: Ruth Walker, female    DOB: [birth year], 67 y.o.   MRN: [id number]  HPI Has uri with painful sinus and nasal passage. No cough, cp, or sob Takes amoxil without problem  Review of Systems     Objective:   Physical Exam  Vitals reviewed. Constitutional: She is oriented to person, place, and time.  HENT:  Right Ear: External ear normal.  Left Ear: External ear normal.  Nose: Mucosal edema, rhinorrhea and sinus tenderness present. Right sinus exhibits maxillary sinus tenderness. Left sinus exhibits maxillary sinus tenderness.  Mouth/Throat: Oropharynx is clear and moist.  Neck: Neck supple.  Cardiovascular: Normal rate, regular rhythm and normal heart sounds.   Pulmonary/Chest: Effort normal and breath sounds normal.  Lymphadenopathy:    She has no cervical adenopathy.  Neurological: She is alert and oriented to person, place, and time. Coordination normal.  Psychiatric: She has a normal mood and affect.   Tender sinuses       Assessment & Plan:  Sinusitis

## 2012-09-26 NOTE — Patient Instructions (Signed)

## 2012-09-29 ENCOUNTER — Ambulatory Visit (INDEPENDENT_AMBULATORY_CARE_PROVIDER_SITE_OTHER): Payer: Medicare Other | Admitting: Internal Medicine

## 2012-09-29 VITALS — BP 146/89 | HR 89 | Temp 98.2°F | Resp 16 | Ht 66.0 in | Wt 134.0 lb

## 2012-09-29 DIAGNOSIS — J9801 Acute bronchospasm: Secondary | ICD-10-CM

## 2012-09-29 DIAGNOSIS — J329 Chronic sinusitis, unspecified: Secondary | ICD-10-CM

## 2012-09-29 NOTE — Progress Notes (Signed)
  Subjective:    Patient ID: Ruth Walker, female    DOB: 10-Feb-1946, 67 y.o.   MRN: [id number]  HPI Has improved a little, tolerates meds. No cough or sob, still fatigued from the bug. No cp, or blood seen. No night sweats. Over all looks and feels better. Anxiety   Review of Systems No change    Objective:   Physical Exam  Vitals reviewed. Constitutional: She is oriented to person, place, and time. She appears well-developed and well-nourished. No distress.  HENT:  Right Ear: External ear normal.  Left Ear: External ear normal.  Nose: Mucosal edema, rhinorrhea and sinus tenderness present. Right sinus exhibits no maxillary sinus tenderness and no frontal sinus tenderness. Left sinus exhibits no maxillary sinus tenderness and no frontal sinus tenderness.  Mouth/Throat: Oropharynx is clear and moist.  Eyes: EOM are normal.  Neck: Neck supple.  Cardiovascular: Normal rate, regular rhythm and normal heart sounds.   Pulmonary/Chest: Effort normal. No respiratory distress. She has wheezes. She has no rales. She exhibits no tenderness.  Lymphadenopathy:    She has no cervical adenopathy.  Neurological: She is alert and oriented to person, place, and time. She exhibits normal muscle tone. Coordination normal.  Psychiatric: She has a normal mood and affect.          Assessment & Plan:  Continue same program Use your albuterol if wheeze

## 2012-10-30 ENCOUNTER — Encounter: Payer: Self-pay | Admitting: Gynecology

## 2012-10-30 ENCOUNTER — Ambulatory Visit (INDEPENDENT_AMBULATORY_CARE_PROVIDER_SITE_OTHER): Payer: Medicare Other | Admitting: Gynecology

## 2012-10-30 VITALS — BP 146/90

## 2012-10-30 DIAGNOSIS — N907 Vulvar cyst: Secondary | ICD-10-CM | POA: Insufficient documentation

## 2012-10-30 DIAGNOSIS — N9489 Other specified conditions associated with female genital organs and menstrual cycle: Secondary | ICD-10-CM

## 2012-10-30 MED ORDER — DOXYCYCLINE HYCLATE 100 MG PO CAPS: 100.0000 mg | ORAL_CAPSULE | Freq: Two times a day (BID) | ORAL | Status: DC

## 2012-10-30 MED ORDER — HYDROCODONE-ACETAMINOPHEN 5-325 MG PO TABS: 1.0000 | ORAL_TABLET | Freq: Four times a day (QID) | ORAL | Status: DC | PRN

## 2012-10-30 NOTE — Patient Instructions (Addendum)

## 2012-10-30 NOTE — Progress Notes (Signed)
Patient presented to the office today with a complaint of persistent right labial swelling. She had seen in my partner Dr. Audie Box on March 3 and is assessed there was a small labial boil and he recommended applying local heat and if it persists to return to the office. Patient states is tender and she is very anxious about it. She's not any drainage her any fever.  Exam: Physical Exam  Genitourinary:     this appears to be an indurated sebaceous cysts (epidural inclusion cyst). The area was prepped with Betadine solution. 1% lidocaine was infiltrated at its base. A small incision was made directly over the cyst. No drainage only sebaceous-like material.The loculations were broken down with a curved hemostat. The area was debrided with hydrogen peroxide and and a small gauze wick was placed.  Patient will return back tomorrow to remove this gauze wick. She will be started on Vibramycin 100 mg one by mouth twice a day for 7 days. I've given her prescription for Lortab 3.5/250 to take one by mouth every 4-6 hours when necessary.

## 2012-10-31 ENCOUNTER — Encounter: Payer: Self-pay | Admitting: Gynecology

## 2012-10-31 ENCOUNTER — Ambulatory Visit (INDEPENDENT_AMBULATORY_CARE_PROVIDER_SITE_OTHER): Payer: Medicare Other | Admitting: Gynecology

## 2012-10-31 ENCOUNTER — Telehealth: Payer: Self-pay | Admitting: *Deleted

## 2012-10-31 VITALS — BP 148/90

## 2012-10-31 DIAGNOSIS — N9089 Other specified noninflammatory disorders of vulva and perineum: Secondary | ICD-10-CM

## 2012-10-31 NOTE — Telephone Encounter (Signed)
Pt called asking if okay to use Neosporin plus pain relief, I told pt okay to use this. Jf told pt on OV on 10/31/12 to use medication externally.

## 2012-10-31 NOTE — Progress Notes (Signed)
Patient presented to the office today for removal of packing from the site of an I&D of a right inferior labia majora sebaceous cysts. See previous note dated 10/30/2012. Patient started her Vibramycin 100 mg twice a day for 7 days. Patient with no major complaints today.  Exam: The gauze wick was removed. The area was irrigated with hydrogen peroxide. The area was soft minimally tender very mild ecchymosis.  Assessment/plan: Patient status post I&D of epidermal inclusion cysts that patient with symptomatic. She was started on Vibramycin 100 mg twice a day for 7 days for prophylaxis. Patient will do home debridement with external application of Neosporin. She'll return back in one week for final postop visit.

## 2012-11-04 ENCOUNTER — Telehealth: Payer: Self-pay | Admitting: *Deleted

## 2012-11-04 NOTE — Telephone Encounter (Signed)
Pt asked if to continue off estrace cream since 10/30/12 OV. She has follow up appt with JF on 11/08/12 I told pt to discuss with JF on this date. Pt okay with this.

## 2012-11-05 ENCOUNTER — Telehealth: Payer: Self-pay | Admitting: Gynecology

## 2012-11-05 NOTE — Telephone Encounter (Signed)
Pt called tonight to report bleeding and discomfort from a vulvar biopsy site that is now 6d out.  Pt had been using neosporin and been wearing a pad, on close inspection she believes that the scab may have come off, she has discomfort with void and sitting.  She noticed the area seems red.   Recommend pt leave area open to air as possible, sitz baths, and rinse and dry on cool setting after void. She should move f/u appt up if possible

## 2012-11-06 ENCOUNTER — Encounter: Payer: Self-pay | Admitting: Gynecology

## 2012-11-06 ENCOUNTER — Ambulatory Visit (INDEPENDENT_AMBULATORY_CARE_PROVIDER_SITE_OTHER): Payer: Medicare Other | Admitting: Gynecology

## 2012-11-06 VITALS — BP 130/86

## 2012-11-06 DIAGNOSIS — L723 Sebaceous cyst: Secondary | ICD-10-CM

## 2012-11-06 NOTE — Progress Notes (Signed)
Patient presented to the office today for followup. Patient was seen in the office on 10/30/2012 where she underwent an I&D of a right inferior labia majora sebaceous cyst. She had been placed on Vibramycin 100 mg twice a day for one week and she was doing debridements at home with antibacterial soap and applying Neosporin at bedtime.  Patient is doing well otherwise had some slight little drainage and has had to use very little of the narcotics for pain relief.  Exam: Inferior portion of right labia majora almost completely healed nontender nonfluctuant nonerythematous and was not draining.  Assessment/plan: Patient status post I&D of a right inferior labia majora sebaceous cyst doing well. Almost completely healed. She can apply Neosporin every other day for the next 3-5 days and she will be finished. She scheduled to return back to the office later in the summer for her annual exam.

## 2012-11-08 ENCOUNTER — Ambulatory Visit: Payer: Medicare Other | Admitting: Gynecology

## 2012-11-17 ENCOUNTER — Ambulatory Visit (INDEPENDENT_AMBULATORY_CARE_PROVIDER_SITE_OTHER): Payer: Medicare Other | Admitting: Family Medicine

## 2012-11-17 VITALS — BP 105/73 | HR 80 | Temp 99.0°F | Resp 18 | Wt 128.0 lb

## 2012-11-17 DIAGNOSIS — R197 Diarrhea, unspecified: Secondary | ICD-10-CM

## 2012-11-17 DIAGNOSIS — R112 Nausea with vomiting, unspecified: Secondary | ICD-10-CM

## 2012-11-17 DIAGNOSIS — A0811 Acute gastroenteropathy due to Norwalk agent: Secondary | ICD-10-CM

## 2012-11-17 NOTE — Patient Instructions (Addendum)

## 2012-11-17 NOTE — Progress Notes (Signed)
67 yo woman with acute N, V, D and asthma.  I called in phenergan last night and she did sleep well overnight.  She has kept down water.   Also, has felt some tightness in chest  Objective: NAD, alert BP 100/60 sitting, 95/60 standing Chest:  Clear (perhaps single faint wheeze) Heart:  Reg, no murmur Abdomen:  Soft, hypreactive, mild diffuse tenderness  Assessment: norovirus, controlled asthma.  Stable now  Plan:  Continue bid phenergan Clear liquids Return for any further vomiting  Signed, Elvina Sidle, MD

## 2012-12-26 ENCOUNTER — Ambulatory Visit (INDEPENDENT_AMBULATORY_CARE_PROVIDER_SITE_OTHER): Payer: Medicare Other | Admitting: Family Medicine

## 2012-12-26 VITALS — BP 122/88 | HR 86 | Temp 99.2°F | Resp 16 | Ht 65.0 in | Wt 130.0 lb

## 2012-12-26 DIAGNOSIS — J309 Allergic rhinitis, unspecified: Secondary | ICD-10-CM

## 2012-12-26 DIAGNOSIS — T148XXA Other injury of unspecified body region, initial encounter: Secondary | ICD-10-CM

## 2012-12-26 DIAGNOSIS — M549 Dorsalgia, unspecified: Secondary | ICD-10-CM

## 2012-12-26 DIAGNOSIS — J302 Other seasonal allergic rhinitis: Secondary | ICD-10-CM

## 2012-12-26 MED ORDER — HYDROCODONE-ACETAMINOPHEN 5-325 MG PO TABS: ORAL_TABLET | ORAL | Status: DC

## 2012-12-26 MED ORDER — CYCLOBENZAPRINE HCL 5 MG PO TABS: 5.0000 mg | ORAL_TABLET | Freq: Every evening | ORAL | Status: DC | PRN

## 2012-12-26 NOTE — Progress Notes (Signed)
[facility]:  Office Visit  Chief Complaint:  Chief Complaint  Patient presents with  . Back Pain    1 day  . Sinusitis    x 1 week  . warts on both hands    HPI: Ruth Walker is a 67 y.o. female who complains of: 1. 2 week history of acute on chronic back pain, worse yesterday after carrying purex detergent from car into house, primarily on the left side. She has had back issues before but may have sprained it this time worse than normal. She has a h/o osteoporosis with veterbral fx and was on fosamx for 7 years then is now on a break. Only taking vitamin D and calcium. She denies issues with weightbearing or any falls. She is worried because prolong sitting hurts and she is supposed to go to [facility] tomorrow with her grandkids, additionally she may have axacerbated by carryin gher grandchild up and down stairs while watching them for her daughter. They have a 3 story house.  2. She has seasonal and cat/shellfish allergies and is keeping her sons Binnie Kand, she had allergy shot yesterday, has had them for 20 years. Wants to know if she has infection, and if ok for her to take allegra in the AM and zyrtec at night 3. Has warts on left middle finger and right left finger. This is recurrent  Past Medical History  Diagnosis Date  . Hemorrhoids   . Irritable bowel syndrome (IBS)   . Asthma     Gets allergy shots once a week   . Skin cancer     Leg  . Cholelithiasis   . Diverticulosis   . Anal fissure   . Anxiety states   . Uterine polyp   . Osteoporosis   . Vertebral compression fracture   . Environmental allergies   . Allergy   . Depression    Past Surgical History  Procedure Laterality Date  . Cesarean section    . Hemorrhoid surgery    . Back surgery    . Skin cancer excision      Leg   . Endometrial polypectomy    . Hysteroscopy    . Dilation and curettage of uterus    . Mouth surgery    . Fracture surgery      neck (vertebrae)   History    Social History  . Marital Status: Married    Spouse Name: N/A    Number of Children: 2  . Years of Education: N/A   Occupational History  . Retired    Social History Main Topics  . Smoking status: Never Smoker   . Smokeless tobacco: Never Used  . Alcohol Use: No  . Drug Use: No  . Sexually Active: Yes    Birth Control/ Protection: Post-menopausal     Comment: number of sex partners in the last 12 months  1   Other Topics Concern  . None   Social History Narrative   Patient gets regular exercise   Family History  Problem Relation Age of Onset  . Diabetes Mother   . Hypertension Mother   . Thyroid disease Mother   . Colon cancer Neg Hx   . Hypertension Father   . Heart disease Father   . Thyroid disease Sister    Allergies  Allergen Reactions  . Aloe   . Benadryl (Diphenhydramine Hcl) Other (See Comments)    Makes her feel "hyper"  . Cephalexin     REACTION:  coarse rash  . Ciprofloxacin   . Mucinex (Guaifenesin Er) Other (See Comments)    Makes her feel "hyper".  . Shellfish Allergy   . Sudafed (Pseudoephedrine Hcl) Other (See Comments)    Makes her feel "hyper"  . Sulfonamide Derivatives    Prior to Admission medications   Medication Sig Start Date End Date Taking? Authorizing Provider  aspirin 81 MG tablet Take 81 mg by mouth daily.     Yes Historical Provider, MD  Calcium Carbonate-Vit D-Min (CALCIUM 1200) 1200-1000 MG-UNIT CHEW Chew 1 tablet by mouth daily.     Yes Historical Provider, MD  cycloSPORINE (RESTASIS) 0.05 % ophthalmic emulsion 1 drop 2 (two) times daily.     Yes Historical Provider, MD  estradiol (ESTRACE VAGINAL) 0.1 MG/GM vaginal cream Place vaginally. As directed    Yes Historical Provider, MD  hydrochlorothiazide (HYDRODIURIL) 25 MG tablet take 1 tablet by mouth once daily 08/27/12  Yes Ok Edwards, MD  lidocaine-hydrocortisone [facility]) 3-0.5 % CREA Place 1 Applicatorful rectally 2 (two) times daily. 03/04/12  Yes Meredith Pel, NP  loratadine (CLARITIN) 10 MG tablet Take 10 mg by mouth daily.   Yes Historical Provider, MD  metoprolol tartrate (LOPRESSOR) 25 MG tablet take 1 tablet by mouth twice a day 04/25/12  Yes Wendall Stade, MD  polyethylene glycol (MIRALAX / GLYCOLAX) packet Take 17 g by mouth daily. dissolve in 8 oz. Of water at bedtime    Yes Historical Provider, MD  PRESCRIPTION MEDICATION Allergy injection once a week.[facility]   Yes Historical Provider, MD  Probiotic Product (ALIGN) 4 MG CAPS Take 1 capsule by mouth daily. 07/05/11  Yes Mardella Layman, MD  trimethoprim (TRIMPEX) 100 MG tablet Take 100 mg by mouth daily.     Yes Historical Provider, MD  Vitamin D, Ergocalciferol, (DRISDOL) 50000 UNITS CAPS take 1 capsule by mouth every OTHER WEEK 12/14/11  Yes Trellis Paganini, MD  Wheat Dextrin (BENEFIBER DRINK MIX PO) Take 10 mLs by mouth every morning. Take 2 teaspoons every morning     Yes Historical Provider, MD     ROS: The patient denies fevers, chills, night sweats, unintentional weight loss, chest pain, palpitations, wheezing, dyspnea on exertion, nausea, vomiting, abdominal pain, dysuria, hematuria, melena, numbness, weakness, or tingling.   All other systems have been reviewed and were otherwise negative with the exception of those mentioned in the HPI and as above.    PHYSICAL EXAM: Filed Vitals:   12/26/12 1453  BP: 122/88  Pulse: 86  Temp: 99.2 F (37.3 C)  Resp: 16   Filed Vitals:   12/26/12 1453  Height: 5\' 5"  (1.651 m)  Weight: 130 lb (58.968 kg)   Body mass index is 21.63 kg/(m^2).  General: Alert, no acute distress HEENT:  Normocephalic, atraumatic, oropharynx patent. Tm nl, no exudates, PERRLA, fundoscopic exam nl. No sinus enerness Cardiovascular:  Regular rate and rhythm, no rubs murmurs or gallops.  No Carotid bruits, radial pulse intact. No pedal edema.  Respiratory: Clear to auscultation bilaterally.  No wheezes, rales, or rhonchi.   No cyanosis, no use of accessory musculature GI: No organomegaly, abdomen is soft and non-tender, positive bowel sounds.  No masses. Skin: No rashes. Neurologic: Facial musculature symmetric. Psychiatric: Patient is appropriate throughout our interaction. Lymphatic: No cervical lymphadenopathy Musculoskeletal: Gait intact. + tender bilateral SI , let greater than right Full ROm, weightbearing nl, 5/5 stregnth, 2/2 DTrs Straight leg neg.  Lumbar spine nontender  LABS: Results for orders placed in visit on 08/16/12  POCT RAPID STREP A (OFFICE)      Result Value Range   Rapid Strep A Screen Negative  Negative     EKG/XRAY:   Primary read interpreted by Dr. Conley Rolls at [facility].   ASSESSMENT/PLAN: Encounter Diagnoses  Name Primary?  . Back pain Yes  . Sprain and strain   . Seasonal allergies    She appears to have SI joint tenderness c/w sacraliliitis. NKI so will not xray. Clinical exam at left hip is tender at SI jt  and weightbearing normal.  Rx Hydrocodone and flexeril 1/2 to 1 tab sparingly, advise not to operate heavy machinary with meds Take zyrtec/allegra and or claritin in AM/PM Warts will be addressed next visit, try OTC meds first Gross sideeffects, risk and benefits, and alternatives of medications d/w patient. Patient is aware that all medications have potential sideeffects and we are unable to predict every sideeffect or drug-drug interaction that may occur. F/u in 1-2 weeks prn    Myna Freimark PHUONG, DO 12/26/2012 4:32 PM

## 2013-01-14 ENCOUNTER — Other Ambulatory Visit: Payer: Self-pay | Admitting: Family Medicine

## 2013-01-14 ENCOUNTER — Ambulatory Visit (INDEPENDENT_AMBULATORY_CARE_PROVIDER_SITE_OTHER): Payer: Medicare Other | Admitting: Physician Assistant

## 2013-01-14 ENCOUNTER — Ambulatory Visit: Payer: Medicare Other

## 2013-01-14 VITALS — BP 136/82 | HR 92 | Temp 97.8°F | Resp 18 | Ht 66.5 in | Wt 131.0 lb

## 2013-01-14 DIAGNOSIS — R202 Paresthesia of skin: Secondary | ICD-10-CM

## 2013-01-14 DIAGNOSIS — M549 Dorsalgia, unspecified: Secondary | ICD-10-CM

## 2013-01-14 DIAGNOSIS — R51 Headache: Secondary | ICD-10-CM

## 2013-01-14 MED ORDER — CYCLOBENZAPRINE HCL 5 MG PO TABS: 5.0000 mg | ORAL_TABLET | Freq: Every evening | ORAL | Status: DC | PRN

## 2013-01-14 MED ORDER — MELOXICAM 15 MG PO TABS: 7.5000 mg | ORAL_TABLET | Freq: Every day | ORAL | Status: DC

## 2013-01-14 MED ORDER — HYDROCODONE-ACETAMINOPHEN 5-325 MG PO TABS: ORAL_TABLET | ORAL | Status: DC

## 2013-01-14 NOTE — Progress Notes (Signed)
  Subjective:    Patient ID: Ruth Walker, female    DOB: [birth year], 67 y.o.   MRN: [id number]  HPI This 67 y.o. female presents for evaluation of low back pain.  She was evaluated for same last month, and Dr. Irwin Brakeman note is reviewed.  She reports that the discomfort has moved down into the buttocks bilaterally and she has a "prickley" or "tingling" sensation in the legs that extends down to the knees, L>R.  She thinks this is similar to sciatica she had a number of years ago. No loss of bladder or bowel control, no saddle anesthesia, no lower extremity weakness.  No dragging of the leg/foot, difficulty walking.  She cares for her grandchildren during the week, and climbs the stairs in their 3-story home repeatedly each day.  It makes her muscles sore, but she has no other difficulty with that activity.  No falls, no heavy lifting. She does have osteoporosis and a history of a compression fracture.  Took bisphosphonates x 7 years, d/c'd last year.  Continues on calcium and vitamin D daily.   Past medical history, surgical history, family history, social history and problem list reviewed.   Review of Systems As above.    Objective:   Physical Exam  Blood pressure 136/82, pulse 92, temperature 97.8 F (36.6 C), temperature source Oral, resp. rate 18, height 5' 6.5" (1.689 m), weight 131 lb (59.421 kg), SpO2 99.00%. Body mass index is 20.83 kg/(m^2). Well-developed, well nourished WF who is awake, alert and oriented, in NAD. HEENT: [place]/AT, sclera and conjunctiva are clear.   Neck: supple, non-tender, no lymphadenopathy, thyromegaly. Heart: RRR, no murmur Lungs: normal effort, CTA Back:  No tenderness of the thoracic or lumbar spine or paraspinous muscles.  Mild tenderness of the SI areas bilaterally. Extremities: no cyanosis, clubbing or edema. Skin: warm and dry without rash. Neurologic:  Normal sensation and strength in the upper and lower extremities bilaterally. DTRs are symmetrically  strong, EXCEPT I am unable to elicit an ankle reflex on the LEFT.  No ankle clonus. Psychologic: good mood and appropriate affect, normal speech and behavior.  LSSPINE: [facility] reading (PRIMARY) by  Dr. Conley Rolls.  Diffuse degenerative changes, osteoporosis, scoliosis.  No acute changes.      Assessment & Plan:  Back pain - Plan: DG Lumbar Spine Complete, meloxicam (MOBIC) 15 MG tablet, cyclobenzaprine (FLEXERIL) 5 MG tablet, HYDROcodone-acetaminophen (NORCO) 5-325 MG per tablet  Paresthesia of both legs  Reassess in 2 weeks, sooner if she develops any leg weakness, gait disturbance, loss of bowel/bladder control or worsening pain/paresthesias. Avoid steroids for now, given osteoporosis.  May need referral and/or additional imaging.  Fernande Bras, PA-C Physician Assistant-Certified [facility]

## 2013-01-14 NOTE — Patient Instructions (Signed)
Apply ICE to the glute area where you have pain to help reduce the inflammation.

## 2013-01-25 ENCOUNTER — Ambulatory Visit (INDEPENDENT_AMBULATORY_CARE_PROVIDER_SITE_OTHER): Payer: Medicare Other | Admitting: Family Medicine

## 2013-01-25 VITALS — BP 140/88 | HR 111 | Temp 98.3°F | Resp 18 | Ht 66.5 in | Wt 135.0 lb

## 2013-01-25 DIAGNOSIS — S76319A Strain of muscle, fascia and tendon of the posterior muscle group at thigh level, unspecified thigh, initial encounter: Secondary | ICD-10-CM

## 2013-01-25 DIAGNOSIS — IMO0002 Reserved for concepts with insufficient information to code with codable children: Secondary | ICD-10-CM

## 2013-01-25 DIAGNOSIS — M549 Dorsalgia, unspecified: Secondary | ICD-10-CM

## 2013-01-25 MED ORDER — TRAMADOL HCL 50 MG PO TABS: 50.0000 mg | ORAL_TABLET | Freq: Three times a day (TID) | ORAL | Status: DC | PRN

## 2013-01-25 MED ORDER — CYCLOBENZAPRINE HCL 5 MG PO TABS: 5.0000 mg | ORAL_TABLET | Freq: Every evening | ORAL | Status: DC | PRN

## 2013-01-25 NOTE — Patient Instructions (Signed)
*  RADIOLOGY REPORT*  Clinical Data: Low back pain.  LUMBAR SPINE - COMPLETE 4+ VIEW  Comparison: None.  Findings: No fracture or significant spondylolisthesis is noted.  Minimal dextroscoliosis of lower thoracic and upper lumbar spine is  noted. Severe degenerative disc disease is noted at L2-3 with  anterior osteophyte formation. Degenerative disc disease is also  noted at L5 S1. Mild degenerative change involving posterior facet  joints of L4-5 and L5 S1 is noted.  IMPRESSION:  Degenerative changes as described above. No acute abnormality seen  in lumbar spine.  Clinically significant discrepancy from primary report, if  provided: None    SLR:  Negative Extrem:  Good hip and knee ROM, good pulses Reflexes:  Absent left ankle reflex;  Good reflexes elsewhere.  Assessment:  Muscle soreness from activity

## 2013-01-25 NOTE — Progress Notes (Signed)
67 YO female patient here today to follow up in regards to back pain. She is concerned that she has no reflexes in her left leg.  About a month ago she lifted a case of water and carried them for her mother. She reports lumbar pain the next morning. The pain resolved a few days after being seen here two weeks ago. When she feels the pain now it is in her left buttock area and radiates to the other side and down her legs. She feels pins and needles. She reports that walking helps the pain.   She has been taking the Mobic. She has not taken any pain medication during the day. She really needs something to ease her pain. She can not take the Hydrocodone during the day it causes her to be very fatigued.   6 months of running up and down the stairs.  She was seen one month ago and given hydrocodone and muscle relaxer for hs use.  The back pain has subsided, but she now has bilateral ischial pain.  Objective:  NAD  *RADIOLOGY REPORT*  Clinical Data: Low back pain.  LUMBAR SPINE - COMPLETE 4+ VIEW  Comparison: None.  Findings: No fracture or significant spondylolisthesis is noted.  Minimal dextroscoliosis of lower thoracic and upper lumbar spine is  noted. Severe degenerative disc disease is noted at L2-3 with  anterior osteophyte formation. Degenerative disc disease is also  noted at L5 S1. Mild degenerative change involving posterior facet  joints of L4-5 and L5 S1 is noted.  IMPRESSION:  Degenerative changes as described above. No acute abnormality seen  in lumbar spine.  Clinically significant discrepancy from primary report, if  provided: None   SLR:  Negative Extrem:  Good hip and knee ROM, good pulses Reflexes:  Absent left ankle reflex;  Good reflexes elsewhere.  Assessment:  Muscle soreness from activity  Hamstring muscle strain, unspecified laterality, initial encounter - Plan: Ambulatory referral to Physical Therapy, traMADol (ULTRAM) 50 MG tablet  Back pain - Plan:  cyclobenzaprine (FLEXERIL) 5 MG tablet  Signed, Elvina Sidle, MD

## 2013-02-10 ENCOUNTER — Encounter: Payer: Self-pay | Admitting: *Deleted

## 2013-02-10 ENCOUNTER — Telehealth: Payer: Self-pay | Admitting: Gastroenterology

## 2013-02-10 NOTE — Telephone Encounter (Signed)
Pt reports she tore a muscle in her gluteus and has been on hydrocodone and tramadol causing constipation. The constipation has been getting worse and yesterday with straining she tore herself badly. She was given an appt in am, but will use sitz baths and cream she has until then.

## 2013-02-11 ENCOUNTER — Ambulatory Visit (INDEPENDENT_AMBULATORY_CARE_PROVIDER_SITE_OTHER): Payer: Medicare Other | Admitting: Physician Assistant

## 2013-02-11 ENCOUNTER — Encounter: Payer: Self-pay | Admitting: Physician Assistant

## 2013-02-11 VITALS — BP 102/70 | HR 80 | Ht 65.5 in | Wt 131.0 lb

## 2013-02-11 DIAGNOSIS — K644 Residual hemorrhoidal skin tags: Secondary | ICD-10-CM

## 2013-02-11 DIAGNOSIS — K648 Other hemorrhoids: Secondary | ICD-10-CM

## 2013-02-11 MED ORDER — HYDROCORTISONE ACETATE 25 MG RE SUPP: RECTAL | Status: DC

## 2013-02-11 MED ORDER — LIDOCAINE-HYDROCORTISONE ACE 3-0.5 % RE CREA: 1.0000 | TOPICAL_CREAM | Freq: Two times a day (BID) | RECTAL | Status: DC

## 2013-02-11 NOTE — Progress Notes (Signed)
Subjective:    Patient ID: Ruth Walker, female    DOB: 1946-04-22, 67 y.o.   MRN: [id number]  HPI  Ruth Walker is a very nice 67 year old white female known to Dr. Jarold Motto who has history of constipation and IBS and prior anal fissure. She last had colonoscopy in March of 2010 was noted to have mild diverticulosis at that time . She comes in today for evaluation of rectal discomfort and bleeding. She says she's been having a lot of problems with low back pain and has been taking hydrocodone regularly for the past month. She was just switched to alter M4 daytime use and is now undergoing physical therapy as well. She says that she's not been particularly constipated because she is having a bowel movement every day but she does have to have a lot of straining to have a bowel movement. She says she developed rectal discomfort several days ago and then yesterday he noticed bright red blood on the tissue. She admits that she gets extremely anxious whenever she sees any blood and called for an appointment.  She has not had a more any further bleeding. She does have Anamantel  to use at home and is also on a regimen of probiotics, Benefiber and MiraLax chronically for her constipation.     Review of Systems  Constitutional: Negative.   HENT: Negative.   Eyes: Negative.   Respiratory: Negative.   Cardiovascular: Negative.   Gastrointestinal: Positive for constipation and anal bleeding.  Endocrine: Negative.   Genitourinary: Negative.   Allergic/Immunologic: Negative.   Neurological: Negative.   Hematological: Negative.   Psychiatric/Behavioral: Negative.    Outpatient Prescriptions Prior to Visit  Medication Sig Dispense Refill  . aspirin 81 MG tablet Take 81 mg by mouth daily.        . Calcium Carbonate-Vit D-Min (CALCIUM 1200) 1200-1000 MG-UNIT CHEW Chew 1 tablet by mouth daily.        . cyclobenzaprine (FLEXERIL) 5 MG tablet Take 1 tablet (5 mg total) by mouth at bedtime as needed.  30 tablet   0  . cycloSPORINE (RESTASIS) 0.05 % ophthalmic emulsion 1 drop 2 (two) times daily.        Marland Kitchen estradiol (ESTRACE VAGINAL) 0.1 MG/GM vaginal cream Place vaginally. As directed       . hydrochlorothiazide (HYDRODIURIL) 25 MG tablet take 1 tablet by mouth once daily  30 tablet  9  . HYDROcodone-acetaminophen (NORCO) 5-325 MG per tablet Take 1/2 tab-1 tab PO qhs prn for pain  30 tablet  0  . loratadine (CLARITIN) 10 MG tablet Take 10 mg by mouth daily.      . metoprolol tartrate (LOPRESSOR) 25 MG tablet take 1 tablet by mouth twice a day  60 tablet  11  . polyethylene glycol (MIRALAX / GLYCOLAX) packet Take 17 g by mouth daily. dissolve in 8 oz. Of water at bedtime       . PRESCRIPTION MEDICATION Allergy injection once a week.[facility] [employer]      . Probiotic Product (ALIGN) 4 MG CAPS Take 1 capsule by mouth daily.  21 capsule  0  . traMADol (ULTRAM) 50 MG tablet Take 1 tablet (50 mg total) by mouth every 8 (eight) hours as needed for pain.  30 tablet  1  . trimethoprim (TRIMPEX) 100 MG tablet Take 100 mg by mouth daily.        . Vitamin D, Ergocalciferol, (DRISDOL) 50000 UNITS CAPS take 1 capsule by mouth every OTHER WEEK  5  capsule  5  . Wheat Dextrin (BENEFIBER DRINK MIX PO) Take 10 mLs by mouth every morning. Take 2 teaspoons every morning        . lidocaine-hydrocortisone (ANAMANTEL HC) 3-0.5 % CREA Place 1 Applicatorful rectally 2 (two) times daily.  28.35 g  0   No facility-administered medications prior to visit.   Allergies  Allergen Reactions  . Aloe   . Benadryl (Diphenhydramine Hcl) Other (See Comments)    Makes her feel "hyper"  . Cephalexin     REACTION: coarse rash  . Ciprofloxacin   . Mucinex (Guaifenesin Er) Other (See Comments)    Makes her feel "hyper".  . Shellfish Allergy Hives  . Sudafed (Pseudoephedrine Hcl) Hives and Other (See Comments)    Makes her feel "hyper"  . Sulfonamide Derivatives    Patient Active Problem List   Diagnosis Date Noted  .  External hemorrhoids 03/04/2012  . DVT (deep venous thrombosis)   . Uterine polyp   . Osteoporosis   . Vertebral compression fracture   . IBS (irritable bowel syndrome) 09/05/2011  . Constipation, slow transit 09/05/2011  . Anxiety disorder 09/05/2011  . Diverticulosis 11/04/2010  . Gallstones 11/04/2010  . TACHYCARDIA 06/17/2009  . ANAL FISSURE 05/26/2008  . ANAL OR RECTAL PAIN 04/07/2008  . OBSESSIVE-COMPULSIVE PERSONALITY DISORDER 02/28/2008  . HYPERTENSION 02/28/2008  . HEMORRHOIDS 02/28/2008  . ASTHMA 02/28/2008  . IRRITABLE BOWEL SYNDROME 02/28/2008  . CYSTITIS, CHRONIC 02/28/2008   History   Social History Narrative   Patient gets regular exercise   family history includes Diabetes in her mother; Heart disease in her father; Hypertension in her father and mother; and Thyroid disease in her mother and sister.  There is no history of Colon cancer.     Objective:   Physical Exam  well-developed white female in no acute distress, anxious, pleasant. Blood pressure 102/70 pulse 80 height 5 foot 5 weight 131. HEENT; nontraumatic normocephalic EOMI PERRLA sclera anicteric, Supple; no JVD, Cardiovascular; regular rate and rhythm with S1-S2 no murmur or gallop, Pulmonary; clear bilaterally, Abdomen; soft nontender nondistended bowel sounds are active, Rectal; exam there is no external lesion noted on anoscopy she does have internal hemorrhoid with small ulceration, She's not particularly tender to digital exam and no fissure is felt, Extremities; no clubbing cyanosis or edema skin warm and dry, Psych; mood and affect normal and appropriate.      Assessment & Plan:  #41  67 year old female with chronic constipation/IBS exacerbated by narcotic use currently now presenting with rectal discomfort and small volume hematochezia  Secondary  to an internal hemorrhoid. She does not have an anal fissure. #2 diverticulosis #3 low back pain/degenerative disc disease  Plan; Will increase  MiraLax to 1-1/2-2 doses daily. She will continue Benefiber and probiotics Continue AnaMantle cream 3-4 times daily as needed over the next 10 days Anusol-HC suppositories each bedtime x7 day She was also given samples of recta care cream to use when necessary for external discomfort. She is reassured and advised that if she has persistent bleeding and/or increased in volume of bleeding to return for evaluation.

## 2013-02-11 NOTE — Patient Instructions (Addendum)
Take 1 1/2 doses of Miralax daily. We sent a refill to [facility]. We have given you samples of Recticare. You can get this at your pharmacy, [facility], [facility], [facility]. You can use this with the Anamantle.

## 2013-02-16 ENCOUNTER — Ambulatory Visit (INDEPENDENT_AMBULATORY_CARE_PROVIDER_SITE_OTHER): Payer: Medicare Other | Admitting: Family Medicine

## 2013-02-16 VITALS — BP 128/84 | HR 86 | Temp 97.9°F | Resp 16 | Ht 66.5 in | Wt 133.2 lb

## 2013-02-16 DIAGNOSIS — M79609 Pain in unspecified limb: Secondary | ICD-10-CM

## 2013-02-16 DIAGNOSIS — IMO0002 Reserved for concepts with insufficient information to code with codable children: Secondary | ICD-10-CM

## 2013-02-16 DIAGNOSIS — B079 Viral wart, unspecified: Secondary | ICD-10-CM

## 2013-02-16 DIAGNOSIS — S76319A Strain of muscle, fascia and tendon of the posterior muscle group at thigh level, unspecified thigh, initial encounter: Secondary | ICD-10-CM

## 2013-02-16 DIAGNOSIS — M549 Dorsalgia, unspecified: Secondary | ICD-10-CM

## 2013-02-16 MED ORDER — HYDROCODONE-ACETAMINOPHEN 5-325 MG PO TABS: ORAL_TABLET | ORAL | Status: DC

## 2013-02-16 MED ORDER — TRAMADOL HCL 50 MG PO TABS: 50.0000 mg | ORAL_TABLET | Freq: Three times a day (TID) | ORAL | Status: DC | PRN

## 2013-02-16 NOTE — Progress Notes (Signed)
67 year old woman with back pain and sciatica which has gotten much better since seeing a physical therapist. She's doing home exercises and notices an improvement in her posture and in her general well-being.  She wants to extend her physical therapy if possible.  She also has worked on her index fingers  Objective: Patient doing much better, posture is much better, patient is able to bend over and touch her toes, rotator hip side to side. She has good motor strength in both legs.  Keratoses and index fingers were cryotherapied with liquid nitrogen  Assessment: Back pain and sciatica, improved-warty growths is treated with liquid nitrogen  Plan: Continue the physical therapy. Recheck  the patient's warts when necessary Signed, Elvina Sidle

## 2013-02-19 ENCOUNTER — Other Ambulatory Visit: Payer: Self-pay | Admitting: Obstetrics and Gynecology

## 2013-02-21 ENCOUNTER — Ambulatory Visit (INDEPENDENT_AMBULATORY_CARE_PROVIDER_SITE_OTHER): Payer: Medicare Other | Admitting: Family Medicine

## 2013-02-21 VITALS — BP 146/86 | HR 82 | Temp 98.4°F | Resp 18 | Wt 129.0 lb

## 2013-02-21 DIAGNOSIS — I10 Essential (primary) hypertension: Secondary | ICD-10-CM

## 2013-02-21 DIAGNOSIS — J019 Acute sinusitis, unspecified: Secondary | ICD-10-CM

## 2013-02-21 DIAGNOSIS — J309 Allergic rhinitis, unspecified: Secondary | ICD-10-CM

## 2013-02-21 DIAGNOSIS — J302 Other seasonal allergic rhinitis: Secondary | ICD-10-CM

## 2013-02-21 MED ORDER — FLUTICASONE PROPIONATE 50 MCG/ACT NA SUSP: 2.0000 | Freq: Every day | NASAL | Status: DC

## 2013-02-21 MED ORDER — AMOXICILLIN 500 MG PO CAPS: ORAL_CAPSULE | ORAL | Status: DC

## 2013-02-21 NOTE — Progress Notes (Signed)
 [facility]:  Office Visit  Chief Complaint:  Chief Complaint  Patient presents with  . sinus issues    congestion all night unable to sleep     HPI: Ruth Walker is a 67 y.o. female who complains of her for sinus congestion, started after skipping allergy shots, she started having sxs in her face and pressure for 1 week, now has thick yellow drainage. Has not started any histamine since worried about  other medicines she is on. + Ear pressure. + sinus pain. Has taken amoxacillin before without problems. She denies fevers, chils.   Past Medical History  Diagnosis Date  . Hemorrhoids   . Irritable bowel syndrome (IBS)   . Asthma     Gets allergy shots once a week   . Skin cancer     Leg  . Cholelithiasis   . Diverticulosis   . Anal fissure   . Anxiety states   . Uterine polyp   . Osteoporosis   . Vertebral compression fracture   . Environmental allergies   . Allergy   . Depression    Past Surgical History  Procedure Laterality Date  . Cesarean section    . Hemorrhoid surgery    . Back surgery    . Skin cancer excision      Leg   . Endometrial polypectomy    . Hysteroscopy    . Dilation and curettage of uterus    . Mouth surgery    . Fracture surgery      neck (vertebrae)   History   Social History  . Marital Status: Married    Spouse Name: N/A    Number of Children: 2  . Years of Education: N/A   Occupational History  . Retired    Social History Main Topics  . Smoking status: Never Smoker   . Smokeless tobacco: Never Used  . Alcohol Use: No  . Drug Use: No  . Sexual Activity: Yes    Birth Control/ Protection: Post-menopausal     Comment: number of sex partners in the last 12 months  1   Other Topics Concern  . None   Social History Narrative   Patient gets regular exercise   Family History  Problem Relation Age of Onset  . Diabetes Mother   . Hypertension Mother   . Thyroid disease Mother   . Colon cancer Neg Hx   .  Hypertension Father   . Heart disease Father   . Thyroid disease Sister    Allergies  Allergen Reactions  . Aloe   . Benadryl [Diphenhydramine Hcl] Other (See Comments)    Makes her feel "hyper"  . Cephalexin     REACTION: coarse rash  . Ciprofloxacin   . Mucinex [Guaifenesin Er] Other (See Comments)    Makes her feel "hyper".  . Shellfish Allergy Hives  . Sudafed [Pseudoephedrine Hcl] Hives and Other (See Comments)    Makes her feel "hyper"  . Sulfonamide Derivatives    Prior to Admission medications   Medication Sig Start Date End Date Taking? Authorizing Provider  aspirin 81 MG tablet Take 81 mg by mouth daily.     Yes Historical Provider, MD  Calcium Carbonate-Vit D-Min (CALCIUM 1200) 1200-1000 MG-UNIT CHEW Chew 1 tablet by mouth daily.     Yes Historical Provider, MD  cyclobenzaprine (FLEXERIL) 5 MG tablet Take 1 tablet (5 mg total) by mouth at bedtime as needed. 01/25/13  Yes Elvina Sidle, MD  cycloSPORINE (RESTASIS)  0.05 % ophthalmic emulsion 1 drop 2 (two) times daily.     Yes Historical Provider, MD  estradiol (ESTRACE VAGINAL) 0.1 MG/GM vaginal cream Place vaginally. As directed    Yes Historical Provider, MD  hydrochlorothiazide (HYDRODIURIL) 25 MG tablet take 1 tablet by mouth once daily 08/27/12  Yes Ok Edwards, MD  HYDROcodone-acetaminophen [facility]) 5-325 MG per tablet Take 1/2 tab-1 tab PO qhs prn for pain 02/16/13  Yes Elvina Sidle, MD  hydrocortisone (ANUSOL-HC) 25 MG suppository Use 1 suppository at bedtime. 02/11/13  Yes Amy S Esterwood, PA-C  lidocaine-hydrocortisone (ANAMANTEL HC) 3-0.5 % CREA Place 1 Applicatorful rectally 2 (two) times daily. 02/11/13  Yes Amy S Esterwood, PA-C  loratadine (CLARITIN) 10 MG tablet Take 10 mg by mouth daily.   Yes Historical Provider, MD  meloxicam (MOBIC) 15 MG tablet  01/14/13  Yes Historical Provider, MD  metoprolol tartrate (LOPRESSOR) 25 MG tablet take 1 tablet by mouth twice a day 04/25/12  Yes Wendall Stade, MD   polyethylene glycol (MIRALAX / GLYCOLAX) packet Take 17 g by mouth daily. dissolve in 8 oz. Of water at bedtime    Yes Historical Provider, MD  PRESCRIPTION MEDICATION Allergy injection once a week.[facility] [employer]   Yes Historical Provider, MD  Probiotic Product (ALIGN) 4 MG CAPS Take 1 capsule by mouth daily. 07/05/11  Yes Mardella Layman, MD  traMADol (ULTRAM) 50 MG tablet Take 1 tablet (50 mg total) by mouth every 8 (eight) hours as needed for pain. 02/16/13  Yes Elvina Sidle, MD  trimethoprim (TRIMPEX) 100 MG tablet Take 100 mg by mouth daily.     Yes Historical Provider, MD  Vitamin D, Ergocalciferol, (DRISDOL) 50000 UNITS CAPS capsule take 1 capsule by mouth every OTHER WEEK 02/19/13  Yes Ok Edwards, MD  Wheat Dextrin (BENEFIBER DRINK MIX PO) Take 10 mLs by mouth every morning. Take 2 teaspoons every morning     Yes Historical Provider, MD     ROS: The patient denies fevers, chills, night sweats, unintentional weight loss, chest pain, palpitations, wheezing, dyspnea on exertion, nausea, vomiting, abdominal pain, dysuria, hematuria, melena, numbness, weakness, or tingling.   All other systems have been reviewed and were otherwise negative with the exception of those mentioned in the HPI and as above.    PHYSICAL EXAM: Filed Vitals:   02/21/13 1118  BP: 146/86  Pulse: 82  Temp: 98.4 F (36.9 C)  Resp: 18   Filed Vitals:   02/21/13 1118  Weight: 129 lb (58.514 kg)   Body mass index is 20.51 kg/(m^2).  General: Alert, no acute distress HEENT:  Normocephalic, atraumatic, oropharynx patent. EOMI, PERRLA. + right sinus tenderness, Tm nl  Cardiovascular:  Regular rate and rhythm, no rubs murmurs or gallops.  No Carotid bruits, radial pulse intact. No pedal edema.  Respiratory: Clear to auscultation bilaterally.  No wheezes, rales, or rhonchi.  No cyanosis, no use of accessory musculature GI: No organomegaly, abdomen is soft and non-tender, positive bowel  sounds.  No masses. Skin: No rashes. Neurologic: Facial musculature symmetric. Psychiatric: Patient is appropriate throughout our interaction. Lymphatic: No cervical lymphadenopathy Musculoskeletal: Gait intact.   LABS: Results for orders placed in visit on 08/16/12  POCT RAPID STREP A (OFFICE)      Result Value Range   Rapid Strep A Screen Negative  Negative     EKG/XRAY:   Primary read interpreted by Dr. Conley Rolls at [facility] .   ASSESSMENT/PLAN: Encounter Diagnoses  Name Primary?  . Acute  sinusitis Yes  . Seasonal allergies     Rx flonase C/w antihistamine Rx Amox 500 mg 2 tabs PO BID x 10 days Gross sideeffects, risk and benefits, and alternatives of medications d/w patient. Patient is aware that all medications have potential sideeffects and we are unable to predict every sideeffect or drug-drug interaction that may occur.  Hamilton Capri PHUONG, DO 02/21/2013 12:28 PM

## 2013-02-21 NOTE — Patient Instructions (Signed)

## 2013-03-07 ENCOUNTER — Telehealth: Payer: Self-pay | Admitting: Gastroenterology

## 2013-03-07 ENCOUNTER — Telehealth: Payer: Self-pay

## 2013-03-07 NOTE — Telephone Encounter (Signed)
Former patient of Dr. Timoteo Expose. Said he gave her Estrace cream samples and she just noticed that what she has expires Sept 2014.  She has appt for CE with you on 03/31/13 and wondered if okay to use this cream until CE when she gets new Rx.

## 2013-03-07 NOTE — Telephone Encounter (Signed)
Left message with pt's husband to call back.

## 2013-03-07 NOTE — Telephone Encounter (Signed)
Years she can give her free sample

## 2013-03-07 NOTE — Telephone Encounter (Signed)
Patient informed. Sample at front desk for her to pick up next week.

## 2013-03-07 NOTE — Telephone Encounter (Signed)
Pt just wants to make sure she can take 1 and 1/2 caps of Miralax when she needs it. Explained Amy Esterwood, PA wrote in notes on 02/12/23, pt may increase Miralax to 1 1/2 to 2 doses when needed for constipation. Pt stated understanding.

## 2013-03-25 ENCOUNTER — Telehealth: Payer: Self-pay

## 2013-03-25 NOTE — Telephone Encounter (Signed)
Pt has been staying with daughter at there home and they found out that there is mold all in the house including furniture and she is wanting to let Dr L know and she also wants to know if there is anything she needs to know or look out for after being exposed to the mold  Call back number is [phone]

## 2013-03-25 NOTE — Telephone Encounter (Signed)
She should take allergy meds and limit exposure to the mold. She is advised.

## 2013-03-27 ENCOUNTER — Encounter: Payer: Medicare Other | Admitting: Gynecology

## 2013-03-29 ENCOUNTER — Ambulatory Visit (INDEPENDENT_AMBULATORY_CARE_PROVIDER_SITE_OTHER): Payer: Medicare Other | Admitting: Family Medicine

## 2013-03-29 VITALS — BP 134/80 | HR 70 | Temp 98.1°F | Resp 16 | Ht 66.5 in | Wt 129.0 lb

## 2013-03-29 DIAGNOSIS — M5430 Sciatica, unspecified side: Secondary | ICD-10-CM

## 2013-03-29 DIAGNOSIS — S76319A Strain of muscle, fascia and tendon of the posterior muscle group at thigh level, unspecified thigh, initial encounter: Secondary | ICD-10-CM

## 2013-03-29 DIAGNOSIS — Z23 Encounter for immunization: Secondary | ICD-10-CM

## 2013-03-29 MED ORDER — TRAMADOL HCL 50 MG PO TABS: 50.0000 mg | ORAL_TABLET | Freq: Three times a day (TID) | ORAL | Status: DC | PRN

## 2013-03-29 NOTE — Progress Notes (Signed)
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  She is here for follow up of sciatica.   Physical therapy is making a great difference and she feels she needs further treatments.  Living with daughter Victorino Dike and son in law Sesser, along with grandchildren.  Objective:  NAD  moving more easily Ears:  Normal Nose:  Mucopurulent discharge with pale blue mucosa Chest:  Clear Oroph: normal Neck: supple, no adenopathy Reflexes:  Normal KJ, absent AJ bilaterally SLR:  Negative. Spent 45 minutes face-to-face with patient discussing her anxiety is, allergies, and sciatica. Assessment:  Stress, persistent sciatic which is better with PT, allergic rhinitis.  Plan: Recheck in one month Refill Ultram, continue physical therapy, flu shot today, continue allergy shots  Signed, Sheila Oats.D.

## 2013-03-31 ENCOUNTER — Encounter: Payer: Medicare Other | Admitting: Gynecology

## 2013-04-01 ENCOUNTER — Other Ambulatory Visit: Payer: Self-pay | Admitting: Family Medicine

## 2013-04-01 MED ORDER — METOPROLOL TARTRATE 25 MG PO TABS: 25.0000 mg | ORAL_TABLET | Freq: Two times a day (BID) | ORAL | Status: DC

## 2013-04-02 ENCOUNTER — Encounter: Payer: Medicare Other | Admitting: Gynecology

## 2013-04-05 ENCOUNTER — Ambulatory Visit (INDEPENDENT_AMBULATORY_CARE_PROVIDER_SITE_OTHER): Payer: Medicare Other | Admitting: Family Medicine

## 2013-04-05 VITALS — BP 110/70 | HR 82 | Temp 97.9°F | Resp 16 | Ht 66.5 in | Wt 129.0 lb

## 2013-04-05 DIAGNOSIS — IMO0002 Reserved for concepts with insufficient information to code with codable children: Secondary | ICD-10-CM

## 2013-04-05 DIAGNOSIS — T148XXA Other injury of unspecified body region, initial encounter: Secondary | ICD-10-CM

## 2013-04-05 MED ORDER — MUPIROCIN 2 % EX OINT: TOPICAL_OINTMENT | Freq: Two times a day (BID) | CUTANEOUS | Status: DC

## 2013-04-05 NOTE — Progress Notes (Signed)
67 yo woman who noted right groin abrasion overnight.  Objective:  NAD Right groin abrasions  Assessment:  New underwear.  Abrasion - Plan: mupirocin ointment (BACTROBAN) 2 %  Signed, Elvina Sidle, MD

## 2013-04-05 NOTE — Patient Instructions (Addendum)
Abrasion °An abrasion is a cut or scrape of the skin. Abrasions do not extend through all layers of the skin and most heal within 10 days. It is important to care for your abrasion properly to prevent infection. °CAUSES  °Most abrasions are caused by falling on, or gliding across, the ground or other surface. When your skin rubs on something, the outer and inner layer of skin rubs off, causing an abrasion. °DIAGNOSIS  °Your caregiver will be able to diagnose an abrasion during a physical exam.  °TREATMENT  °Your treatment depends on how large and deep the abrasion is. Generally, your abrasion will be cleaned with water and a mild soap to remove any dirt or debris. An antibiotic ointment may be put over the abrasion to prevent an infection. A bandage (dressing) may be wrapped around the abrasion to keep it from getting dirty.  °You may need a tetanus shot if: °· You cannot remember when you had your last tetanus shot. °· You have never had a tetanus shot. °· The injury broke your skin. °If you get a tetanus shot, your arm may swell, get red, and feel warm to the touch. This is common and not a problem. If you need a tetanus shot and you choose not to have one, there is a rare chance of getting tetanus. Sickness from tetanus can be serious.  °HOME CARE INSTRUCTIONS  °· If a dressing was applied, change it at least once a day or as directed by your caregiver. If the bandage sticks, soak it off with warm water.   °· Wash the area with water and a mild soap to remove all the ointment 2 times a day. Rinse off the soap and pat the area dry with a clean towel.   °· Reapply any ointment as directed by your caregiver. This will help prevent infection and keep the bandage from sticking. Use gauze over the wound and under the dressing to help keep the bandage from sticking.   °· Change your dressing right away if it becomes wet or dirty.   °· Only take over-the-counter or prescription medicines for pain, discomfort, or fever as  directed by your caregiver.   °· Follow up with your caregiver within 24 48 hours for a wound check, or as directed. If you were not given a wound-check appointment, look closely at your abrasion for redness, swelling, or pus. These are signs of infection. °SEEK IMMEDIATE MEDICAL CARE IF:  °· You have increasing pain in the wound.   °· You have redness, swelling, or tenderness around the wound.   °· You have pus coming from the wound.   °· You have a fever or persistent symptoms for more than 2 3 days. °· You have a fever and your symptoms suddenly get worse. °· You have a bad smell coming from the wound or dressing.   °MAKE SURE YOU:  °· Understand these instructions. °· Will watch your condition. °· Will get help right away if you are not doing well or get worse. °Document Released: 04/05/2005 Document Revised: 06/12/2012 Document Reviewed: 05/30/2011 °[employer]® Patient Information ©2014 [employer]. ° °

## 2013-04-28 ENCOUNTER — Encounter: Payer: Self-pay | Admitting: Gynecology

## 2013-04-28 ENCOUNTER — Ambulatory Visit (INDEPENDENT_AMBULATORY_CARE_PROVIDER_SITE_OTHER): Payer: Medicare Other | Admitting: Gynecology

## 2013-04-28 VITALS — BP 136/88 | Ht 65.0 in | Wt 129.0 lb

## 2013-04-28 DIAGNOSIS — N952 Postmenopausal atrophic vaginitis: Secondary | ICD-10-CM

## 2013-04-28 DIAGNOSIS — Z1159 Encounter for screening for other viral diseases: Secondary | ICD-10-CM

## 2013-04-28 DIAGNOSIS — Z8781 Personal history of (healed) traumatic fracture: Secondary | ICD-10-CM

## 2013-04-28 DIAGNOSIS — M810 Age-related osteoporosis without current pathological fracture: Secondary | ICD-10-CM

## 2013-04-28 MED ORDER — VITAMIN D (ERGOCALCIFEROL) 1.25 MG (50000 UNIT) PO CAPS: 50000.0000 [IU] | ORAL_CAPSULE | ORAL | Status: DC

## 2013-04-28 NOTE — Progress Notes (Signed)
Ruth Walker 30-Oct-1945 [id number]   History:    67 y.o.  for followup with her history of osteoporosis. Her primary physician is Dr. Collins Scotland who has been doing her lab work. Review of her records as her last bone density study and her past history of osteoporosis as quoted from my partners last note on July 11th 2013 as follows:  "Patient was not certain whether to reinitiate treatment for her osteoporosis.Her T score on her recent bone density was -2.5. Her spine actually showed significant improvement which was felt to be secondary to degenerative changes. Although there was a slight reduction in bone density of the hip it was not statistically significant. The patient has been on either Fosamax or Actonel since 2004. She has had a vertebral compression fracture when she fell in the shower but her orthopedist felt it was traumatic and not a fragility fracture. We had a very long discussion about whether to continue to treat her. I told her I favored switching drugs after these years. I explained her that if we kept her on drug holiday the fosamax Stays in her system for a significant period of time. She understands that if she's treated she might have a fragility fracture and if she's not treated she might not fracture and therefore neither is perfect. Due to her bone density still showing osteoporosis I favored continued treatment and we discussed Prolia, Reclast and Forteo. I told her I favored Prolia."  Patient has not started any medication. She is taking vitamin D 50,000 units every other week in her primary physician is [place] her vitamin D levels once a year. She is overdue for mammogram the last one was in 2013. She had a normal colonoscopy in 2010 and denies any prior history of colon polyps. She also uses Estrace vaginal cream twice a week for vaginal atrophy. Her last Pap smear was in 2013 she denies any past history of abnormal Pap smears. She received the flu vaccine 2 weeks ago but  has not received the shingles vaccine as of yet. She is taking her calcium 600 mg twice a day.  Past medical history,surgical history, family history and social history were all reviewed and documented in the EPIC chart.  Gynecologic History No LMP recorded. Patient is postmenopausal. Contraception: post menopausal status Last Pap: 2013. Results were: normal Last mammogram: 2013. Results were: normal  Obstetric History OB History  Gravida Para Term Preterm AB SAB TAB Ectopic Multiple Living  2 2 2       2     # Outcome Date GA Lbr Len/2nd Weight Sex Delivery Anes PTL Lv  2 TRM           1 TRM                ROS: A ROS was performed and pertinent positives and negatives are included in the history.  GENERAL: No fevers or chills. HEENT: No change in vision, no earache, sore throat or sinus congestion. NECK: No pain or stiffness. CARDIOVASCULAR: No chest pain or pressure. No palpitations. PULMONARY: No shortness of breath, cough or wheeze. GASTROINTESTINAL: No abdominal pain, nausea, vomiting or diarrhea, melena or bright red blood per rectum. GENITOURINARY: No urinary frequency, urgency, hesitancy or dysuria. MUSCULOSKELETAL: No joint or muscle pain, no back pain, no recent trauma. DERMATOLOGIC: No rash, no itching, no lesions. ENDOCRINE: No polyuria, polydipsia, no heat or cold intolerance. No recent change in weight. HEMATOLOGICAL: No anemia or easy bruising or bleeding. NEUROLOGIC: No headache,  seizures, numbness, tingling or weakness. PSYCHIATRIC: No depression, no loss of interest in normal activity or change in sleep pattern.     Exam: chaperone present  BP 136/88  Ht 5\' 5"  (1.651 m)  Wt 129 lb (58.514 kg)  BMI 21.47 kg/m2  Body mass index is 21.47 kg/(m^2).  General appearance : Well developed well nourished female. No acute distress HEENT: Neck supple, trachea midline, no carotid bruits, no thyroidmegaly Lungs: Clear to auscultation, no rhonchi or wheezes, or rib  retractions  Heart: Regular rate and rhythm, no murmurs or gallops Breast:Examined in sitting and supine position were symmetrical in appearance, no palpable masses or tenderness,  no skin retraction, no nipple inversion, no nipple discharge, no skin discoloration, no axillary or supraclavicular lymphadenopathy Abdomen: no palpable masses or tenderness, no rebound or guarding Extremities: no edema or skin discoloration or tenderness  Pelvic:  Bartholin, Urethra, Skene Glands: Within normal limits             Vagina: No gross lesions or discharge, atrophic changes  Cervix: No gross lesions or discharge  Uterus  anteverted, normal size, shape and consistency, non-tender and mobile  Adnexa  Without masses or tenderness  Anus and perineum  normal   Rectovaginal  normal sphincter tone without palpated masses or tenderness             Hemoccult PCP provides     Assessment/Plan:  67 y.o. female with history of vertebral fracture attributed to trauma and not fragility fracture all of the patient was standing when she fell and fractured her vertebra. Patient with prior history of anti-resorptive agent for several years has been on drug-free holiday for past 2 years.  Patient's lowest T. Score at the right femoral neck -2.5 in the osteoporotic range. We discussed today that based on last years FRAX analysis indicating that she exceeded the ten-year threshold for hip fracture 3.6%. I concur with my retired partner who had recommended that she go on Prolia 60 mg subcutaneous every 6 months (monoclonal antibody). Literature and information was provided.Patient will discuss with her PCP or wait until next year  For  her next bone density study. We discussed importance of weightbearing exercises as well as keeping up with her calcium and vitamin D. I have give her  a requisition to schedule her mammogram. We discussed importance of monthly self breast exam.  New CDC guidelines is recommending patients be tested  once in her lifetime for hepatitis C antibody who were born between [birth year] through 1964. This was discussed with the patient today and has agreed to be tested today.  Pap smear was not done today in accordance to the new guidelines.  Note: This dictation was prepared with  Dragon/digital dictation along withSmart phrase technology. Any transcriptional errors that result from this process are unintentional.   Ok Edwards MD, 5:22 PM 04/28/2013

## 2013-04-28 NOTE — Patient Instructions (Addendum)
Shingles Vaccine What You Need to Know WHAT IS SHINGLES?  Shingles is a painful skin rash, often with blisters. It is also called Herpes Zoster or just Zoster.  A shingles rash usually appears on one side of the face or body and lasts from 2 to 4 weeks. Its main symptom is pain, which can be quite severe. Other symptoms of shingles can include fever, headache, chills, and upset stomach. Very rarely, a shingles infection can lead to pneumonia, hearing problems, blindness, brain inflammation (encephalitis), or death.  For about 1 person in 5, severe pain can continue even after the rash clears up. This is called post-herpetic neuralgia.  Shingles is caused by the Varicella Zoster virus. This is the same virus that causes chickenpox. Only someone who has had a case of chickenpox or rarely, has gotten chickenpox vaccine, can get shingles. The virus stays in your body. It can reappear many years later to cause a case of shingles.  You cannot catch shingles from another person with shingles. However, a person who has never had chickenpox (or chickenpox vaccine) could get chickenpox from someone with shingles. This is not very common.  Shingles is far more common in people 50 and older than in younger people. It is also more common in people whose immune systems are weakened because of a disease such as cancer or drugs such as steroids or chemotherapy.  At least 1 million people get shingles per year in the [place]. SHINGLES VACCINE  A vaccine for shingles was licensed in 2006. In clinical trials, the vaccine reduced the risk of shingles by 50%. It can also reduce the pain in people who still get shingles after being vaccinated.  A single dose of shingles vaccine is recommended for adults 60 years of age and older. SOME PEOPLE SHOULD NOT GET SHINGLES VACCINE OR SHOULD WAIT A person should not get shingles vaccine if he or she:  Has ever had a life-threatening allergic reaction to gelatin, the  antibiotic neomycin, or any other component of shingles vaccine. Tell your caregiver if you have any severe allergies.  Has a weakened immune system because of current:  AIDS or another disease that affects the immune system.  Treatment with drugs that affect the immune system, such as prolonged use of high-dose steroids.  Cancer treatment, such as radiation or chemotherapy.  Cancer affecting the bone marrow or lymphatic system, such as leukemia or lymphoma.  Is pregnant, or might be pregnant. Women should not become pregnant until at least 4 weeks after getting shingles vaccine. Someone with a minor illness, such as a cold, may be vaccinated. Anyone with a moderate or severe acute illness should usually wait until he or she recovers before getting the vaccine. This includes anyone with a temperature of 101.3 F (38 C) or higher. WHAT ARE THE RISKS FROM SHINGLES VACCINE?  A vaccine, like any medicine, could possibly cause serious problems, such as severe allergic reactions. However, the risk of a vaccine causing serious harm, or death, is extremely small.  No serious problems have been identified with shingles vaccine. Mild Problems  Redness, soreness, swelling, or itching at the site of the injection (about 1 person in 3).  Headache (about 1 person in 70). Like all vaccines, shingles vaccine is being closely monitored for unusual or severe problems. WHAT IF THERE IS A MODERATE OR SEVERE REACTION? What should I look for? Any unusual condition, such as a severe allergic reaction or a high fever. If a severe allergic reaction   occurred, it would be within a few minutes to an hour after the shot. Signs of a serious allergic reaction can include difficulty breathing, weakness, hoarseness or wheezing, a fast heartbeat, hives, dizziness, paleness, or swelling of the throat. What should I do?  Call your caregiver, or get the person to a caregiver right away.  Tell the caregiver what  happened, the date and time it happened, and when the vaccination was given.  Ask the caregiver to report the reaction by filing a Vaccine Adverse Event Reporting System (VAERS) form. Or, you can file this report through the VAERS web site at www.vaers.LAgents.no or by calling 1-380 179 7267. VAERS does not provide medical advice. HOW CAN I LEARN MORE?  Ask your caregiver. He or she can give you the vaccine package insert or suggest other sources of information.  Contact the Centers for Disease Control and Prevention (CDC):  Call 778-488-6160 (1-800-CDC-INFO).  Visit the CDC website at PicCapture.uy CDC Shingles Vaccine VIS (04/14/08) Document Released: 04/23/2006 Document Revised: 09/18/2011 Document Reviewed: 04/14/2008 ExitCare Patient Information 2014 [place], [place]. Denosumab injection What is this medicine? DENOSUMAB slows bone breakdown. It is used to treat osteoporosis in women after menopause and in men. This medicine is also used to prevent bone fractures and other bone problems caused by cancer bone metastases. This medicine may be used for other purposes; ask your health care provider or pharmacist if you have questions. What should I tell my health care provider before I take this medicine? They need to know if you have any of these conditions: -dental disease -eczema -infection or history of infections -kidney disease or on dialysis -low blood calcium or vitamin D -malabsorption syndrome -scheduled to have surgery or tooth extraction -taking medicine that contains denosumab -thyroid or parathyroid disease -an unusual reaction to denosumab, other medicines, foods, dyes, or preservatives -pregnant or trying to get pregnant -breast-feeding How should I use this medicine? This medicine is for injection under the skin. It is given by a health care professional in a hospital or clinic setting. If you are getting Prolia, a special MedGuide will be given to you by the  pharmacist with each prescription and refill. Be sure to read this information carefully each time. Talk to your pediatrician regarding the use of this medicine in children. Special care may be needed. Overdosage: If you think you've taken too much of this medicine contact a poison control center or emergency room at once. Overdosage: If you think you have taken too much of this medicine contact a poison control center or emergency room at once. NOTE: This medicine is only for you. Do not share this medicine with others. What if I miss a dose? It is important not to miss your dose. Call your doctor or health care professional if you are unable to keep an appointment. What may interact with this medicine? Do not take this medicine with any of the following medications: -other medicines containing denosumab This medicine may also interact with the following medications: -medicines that suppress the immune system -medicines that treat cancer -steroid medicines like prednisone or cortisone This list may not describe all possible interactions. Give your health care provider a list of all the medicines, herbs, non-prescription drugs, or dietary supplements you use. Also tell them if you smoke, drink alcohol, or use illegal drugs. Some items may interact with your medicine. What should I watch for while using this medicine? Visit your doctor or health care professional for regular checks on your progress. Your doctor or  health care professional may order blood tests and other tests to see how you are doing. Call your doctor or health care professional if you get a cold or other infection while receiving this medicine. Do not treat yourself. This medicine may decrease your body's ability to fight infection. You should make sure you get enough calcium and vitamin D while you are taking this medicine, unless your doctor tells you not to. Discuss the foods you eat and the vitamins you take with your health  care professional. See your dentist regularly. Brush and floss your teeth as directed. Before you have any dental work done, tell your dentist you are receiving this medicine. What side effects may I notice from receiving this medicine? Side effects that you should report to your doctor or health care professional as soon as possible: -allergic reactions like skin rash, itching or hives, swelling of the face, lips, or tongue -breathing problems -chest pain -fast, irregular heartbeat -feeling faint or lightheaded, falls -fever, chills, or any other sign of infection -muscle spasms, tightening, or twitches -numbness or tingling -skin blisters or bumps, or is dry, peels, or red -slow healing or unexplained pain in the mouth or jaw -unusual bleeding or bruising Side effects that usually do not require medical attention (Report these to your doctor or health care professional if they continue or are bothersome.): -muscle pain -stomach upset, gas This list may not describe all possible side effects. Call your doctor for medical advice about side effects. You may report side effects to FDA at 1-800-FDA-1088. Where should I keep my medicine? This medicine is only given in a clinic, doctor's office, or other health care setting and will not be stored at home. NOTE: This sheet is a summary. It may not cover all possible information. If you have questions about this medicine, talk to your doctor, pharmacist, or health care provider.  2013, Elsevier/Gold Standard. (04/04/2011 3:40:41 PM)

## 2013-06-13 ENCOUNTER — Ambulatory Visit (INDEPENDENT_AMBULATORY_CARE_PROVIDER_SITE_OTHER): Payer: Medicare Other | Admitting: Family Medicine

## 2013-06-13 ENCOUNTER — Encounter: Payer: Self-pay | Admitting: Family Medicine

## 2013-06-13 VITALS — BP 124/84 | HR 100 | Temp 98.0°F | Resp 17 | Ht 65.0 in | Wt 133.0 lb

## 2013-06-13 DIAGNOSIS — J069 Acute upper respiratory infection, unspecified: Secondary | ICD-10-CM

## 2013-06-13 DIAGNOSIS — R0982 Postnasal drip: Secondary | ICD-10-CM

## 2013-06-13 DIAGNOSIS — J322 Chronic ethmoidal sinusitis: Secondary | ICD-10-CM

## 2013-06-13 DIAGNOSIS — J329 Chronic sinusitis, unspecified: Secondary | ICD-10-CM

## 2013-06-13 MED ORDER — FLUTICASONE PROPIONATE 50 MCG/ACT NA SUSP: NASAL | Status: DC

## 2013-06-13 MED ORDER — AMOXICILLIN 875 MG PO TABS: 875.0000 mg | ORAL_TABLET | Freq: Two times a day (BID) | ORAL | Status: DC

## 2013-06-13 NOTE — Progress Notes (Signed)
Subjective: 67 year old lady who is here with upper respiratory infection for about a week. She's not been running a fever. Her husband has been sick and he infected her she thinks. She does not smoke. She has not been running a fever.  Objective: Alert and oriented. Not ill appearing. TMs normal. Nose clear. Throat clear. Sinuses not particularly tender. Neck supple without significant nodes. Chest clear to auscultation. Heart regular without murmurs.  Assessment: Upper respiratory infection Probable sinusitis  Plan: Amoxicillin and fluticasone nose spray. She has felt 100% better and more energetic since Dr. Faustino Congress scheduled her for physical therapy. She wants to make sure that he knows this.

## 2013-06-13 NOTE — Patient Instructions (Addendum)
Drink lots of fluids  Get enough rest  Amoxicillin one twice daily for infection  Fluticasone nose spray 2 sprays each nostril twice daily for 3 days, then decrease to once daily  Return if worse

## 2013-06-14 ENCOUNTER — Other Ambulatory Visit: Payer: Self-pay | Admitting: Gynecology

## 2013-06-16 ENCOUNTER — Ambulatory Visit (INDEPENDENT_AMBULATORY_CARE_PROVIDER_SITE_OTHER): Payer: Medicare Other | Admitting: Family Medicine

## 2013-06-16 VITALS — BP 132/80 | HR 84 | Temp 98.3°F | Resp 18

## 2013-06-16 DIAGNOSIS — J329 Chronic sinusitis, unspecified: Secondary | ICD-10-CM

## 2013-06-16 MED ORDER — METHYLPREDNISOLONE ACETATE 80 MG/ML IJ SUSP
80.0000 mg | Freq: Once | INTRAMUSCULAR | Status: AC
  Administered 2013-06-16: 80 mg via INTRAMUSCULAR

## 2013-06-16 NOTE — Telephone Encounter (Signed)
Please check to see who has prescribed the HCTZ to this patient before please.

## 2013-06-16 NOTE — Progress Notes (Signed)
Patient ID: CORLEEN OTWELL, female   DOB: 1945-10-02, 67 y.o.   MRN: [id number]  This chart was scribed for Elvina Sidle, MD by Caryn Bee, Medical Scribe. This patient was seen in Room/bed 14 and the patient's care was started at 10:16 AM.  Patient ID: VERNETA HAMIDI MRN: [id number], DOB: 1946/05/12, 67 y.o. Date of Encounter: 06/16/2013, 9:41 AM  Primary Physician: Elvina Sidle, MD  Chief Complaint: Sinus Infection  HPI: 67 y.o. year old female with history below for sinus infection recheck. Pt began taking amoxicillin and Claritin on 12//5/14 night. Pt states that 06/14/13 she felt "dried up" and like she was "getting asthma". Pt also reports a post nasal drip and associated sneezing. Pt also feels that she sometimes wheezes. She is finished with physical therapy for sciatica. Now pt has a Systems analyst and is exercising and feels much better. She enjoys using the stationary bicycle. Pt will be going to [facility] for [place].     Past Medical History  Diagnosis Date  . Hemorrhoids   . Irritable bowel syndrome (IBS)   . Asthma     Gets allergy shots once a week   . Skin cancer     Leg  . Cholelithiasis   . Diverticulosis   . Anal fissure   . Anxiety states   . Uterine polyp   . Osteoporosis   . Vertebral compression fracture   . Environmental allergies   . Allergy   . Depression      Home Meds: Prior to Admission medications   Medication Sig Start Date End Date Taking? Authorizing Provider  amoxicillin (AMOXIL) 875 MG tablet Take 1 tablet (875 mg total) by mouth 2 (two) times daily. 06/13/13  Yes Peyton Najjar, MD  aspirin 81 MG tablet Take 81 mg by mouth daily.     Yes Historical Provider, MD  Calcium Carbonate-Vit D-Min (CALCIUM 1200) 1200-1000 MG-UNIT CHEW Chew 1 tablet by mouth daily.     Yes Historical Provider, MD  cycloSPORINE (RESTASIS) 0.05 % ophthalmic emulsion 1 drop 2 (two) times daily.     Yes Historical Provider, MD  estradiol (ESTRACE VAGINAL)  0.1 MG/GM vaginal cream Place vaginally. As directed    Yes Historical Provider, MD  hydrochlorothiazide (HYDRODIURIL) 25 MG tablet take 1 tablet by mouth once daily 08/27/12  Yes Ok Edwards, MD  hydrocortisone (ANUSOL-HC) 25 MG suppository Use 1 suppository at bedtime. 02/11/13  Yes Amy S Esterwood, PA-C  lidocaine-hydrocortisone (ANAMANTEL HC) 3-0.5 % CREA Place 1 Applicatorful rectally 2 (two) times daily. 02/11/13  Yes Amy S Esterwood, PA-C  loratadine (CLARITIN) 10 MG tablet Take 10 mg by mouth daily.   Yes Historical Provider, MD  meloxicam (MOBIC) 15 MG tablet  01/14/13  Yes Historical Provider, MD  metoprolol tartrate (LOPRESSOR) 25 MG tablet Take 1 tablet (25 mg total) by mouth 2 (two) times daily. 04/01/13  Yes Elvina Sidle, MD  mupirocin ointment (BACTROBAN) 2 % Apply topically 2 (two) times daily. 04/05/13  Yes Elvina Sidle, MD  polyethylene glycol [facility] / GLYCOLAX) packet Take 17 g by mouth daily. dissolve in 8 oz. Of water at bedtime    Yes Historical Provider, MD  PRESCRIPTION MEDICATION Allergy injection once a week.[facility]   Yes Historical Provider, MD  Probiotic Product (ALIGN) 4 MG CAPS Take 1 capsule by mouth daily. 07/05/11  Yes Mardella Layman, MD  traMADol (ULTRAM) 50 MG tablet Take 1 tablet (50 mg total) by mouth every 8 (eight) hours  as needed for pain. 03/29/13  Yes Elvina Sidle, MD  trimethoprim (TRIMPEX) 100 MG tablet Take 100 mg by mouth daily.     Yes Historical Provider, MD  Vitamin D, Ergocalciferol, (DRISDOL) 50000 UNITS CAPS capsule Take 1 capsule (50,000 Units total) by mouth every 7 (seven) days. 04/28/13  Yes Ok Edwards, MD  Wheat Dextrin (BENEFIBER DRINK MIX PO) Take 10 mLs by mouth every morning. Take 2 teaspoons every morning     Yes Historical Provider, MD  fluticasone (FLONASE) 50 MCG/ACT nasal spray Use 2 sprays in each nostril twice daily for 3 days, then daily 06/13/13   Peyton Najjar, MD    Allergies:  Allergies   Allergen Reactions  . Aloe   . Benadryl [Diphenhydramine Hcl] Other (See Comments)    Makes her feel "hyper"  . Cephalexin     REACTION: coarse rash  . Ciprofloxacin   . Mucinex [Guaifenesin Er] Other (See Comments)    Makes her feel "hyper".  . Shellfish Allergy Hives  . Sudafed [Pseudoephedrine Hcl] Hives and Other (See Comments)    Makes her feel "hyper"  . Sulfonamide Derivatives     History   Social History  . Marital Status: Married    Spouse Name: N/A    Number of Children: 2  . Years of Education: N/A   Occupational History  . Retired    Social History Main Topics  . Smoking status: Never Smoker   . Smokeless tobacco: Never Used  . Alcohol Use: No  . Drug Use: No  . Sexual Activity: Yes    Birth Control/ Protection: Post-menopausal     Comment: number of sex partners in the last 12 months  1   Other Topics Concern  . Not on file   Social History Narrative   Patient gets regular exercise     Review of Systems: Constitutional: negative for chills, fever, night sweats, weight changes, or fatigue  HEENT: negative for vision changes, hearing loss, congestion, rhinorrhea, ST, epistaxis, or sinus pressure Positive postnasal drip and sneezing Cardiovascular: negative for chest pain or palpitations Respiratory: negative for hemoptysis, shortness of breath, or cough positive wheezing Abdominal: negative for abdominal pain, nausea, vomiting, diarrhea, or constipation Dermatological: negative for rash Neurologic: negative for headache, dizziness, or syncope All other systems reviewed and are otherwise negative with the exception to those above and in the HPI.   Physical Exam Blood pressure 132/80, pulse 84, temperature 98.3 F (36.8 C), temperature source Oral, resp. rate 18, height 0' (0 m), weight 0 lb (0 kg), SpO2 100.00%., Cannot calculate BMI with a height equal to zero. General: Well developed, well nourished, in no acute distress. Head: Normocephalic,  atraumatic, eyes without discharge, sclera non-icteric, nares are without discharge. Bilateral auditory canals clear, TM's are without perforation, pearly grey and translucent with reflective cone of light bilaterally. Oral cavity moist, posterior pharynx without exudate, erythema, peritonsillar abscess, or post nasal drip.  Neck: Supple. No thyromegaly. Full ROM. No lymphadenopathy. Lungs: Clear bilaterally to auscultation without wheezes, rales, or rhonchi. Breathing is unlabored. Heart: RRR with S1 S2. No murmurs, rubs, or gallops appreciated. Abdomen: Soft, non-tender, non-distended with normoactive bowel sounds. No hepatomegaly. No rebound/guarding. No obvious abdominal masses. Msk:  Strength and tone normal for age. Extremities/Skin: Warm and dry. No clubbing or cyanosis. No edema. No rashes or suspicious lesions. Neuro: Alert and oriented X 3. Moves all extremities spontaneously. Gait is normal. CNII-XII grossly in tact. Psych:  Responds to questions appropriately with  a normal affect.   Labs:   ASSESSMENT AND PLAN:  67 y.o. year old female with persistent sinus symptoms Sinusitis, chronic - Plan: methylPREDNISolone acetate (DEPO-MEDROL) injection 80 mg   Signed, Elvina Sidle, MD 06/16/2013 9:41 AM

## 2013-06-16 NOTE — Telephone Encounter (Signed)
Last filled on 08/27/12

## 2013-06-18 ENCOUNTER — Ambulatory Visit (INDEPENDENT_AMBULATORY_CARE_PROVIDER_SITE_OTHER): Payer: Medicare Other | Admitting: Family Medicine

## 2013-06-18 ENCOUNTER — Encounter: Payer: Self-pay | Admitting: Family Medicine

## 2013-06-18 VITALS — BP 152/92 | HR 79 | Temp 98.5°F | Resp 16 | Ht 65.0 in

## 2013-06-18 DIAGNOSIS — S91339A Puncture wound without foreign body, unspecified foot, initial encounter: Secondary | ICD-10-CM

## 2013-06-18 DIAGNOSIS — Z23 Encounter for immunization: Secondary | ICD-10-CM

## 2013-06-18 DIAGNOSIS — J309 Allergic rhinitis, unspecified: Secondary | ICD-10-CM

## 2013-06-18 DIAGNOSIS — S91309A Unspecified open wound, unspecified foot, initial encounter: Secondary | ICD-10-CM

## 2013-06-18 MED ORDER — MUPIROCIN 2 % EX OINT: TOPICAL_OINTMENT | Freq: Two times a day (BID) | CUTANEOUS | Status: DC

## 2013-06-18 MED ORDER — TETANUS-DIPHTH-ACELL PERTUSSIS 5-2.5-18.5 LF-MCG/0.5 IM SUSP
0.5000 mL | Freq: Once | INTRAMUSCULAR | Status: AC
  Administered 2013-06-18: 0.5 mL via INTRAMUSCULAR

## 2013-06-18 NOTE — Progress Notes (Signed)
67 year old married woman help take care of her grandchildren. She stepped on a fishhook first husband left out this morning. She needs a tetanus shot for that.  In addition she has gotten some better but she still has some burning in her nose on the cold that she's been suffering from the last 10 days.  Patient will be going to [facility] for [place].  Objective:  NAD Ecchymotic left foot at head of 4th metatarsal without obvious laceration Red nasal septum  Puncture wound of foot, unspecified laterality, initial encounter - Plan: Tdap (BOOSTRIX) injection 0.5 mL  Allergic rhinitis - Plan: mupirocin ointment (BACTROBAN) 2 %  Signed, Elvina Sidle, MD

## 2013-06-23 ENCOUNTER — Telehealth: Payer: Self-pay | Admitting: *Deleted

## 2013-06-23 MED ORDER — HYDROCHLOROTHIAZIDE 25 MG PO TABS: 25.0000 mg | ORAL_TABLET | Freq: Every day | ORAL | Status: DC

## 2013-06-23 NOTE — Telephone Encounter (Signed)
rx sent and pt informed Ruth Walker

## 2013-06-23 NOTE — Telephone Encounter (Signed)
Pt HCTZ 25 MG was denied on 06/16/13 Dr.G originally prescribed medication. You refilled Rx last back in 08/27/12. Pt only has 2 pills left. Please advise

## 2013-06-23 NOTE — Telephone Encounter (Signed)
Please call and prescription for HCTZ 25 mg 1 by mouth daily #30 with 3 refills. Check to see when was her last annual exam I believe she is due for it in February 2015

## 2013-07-07 ENCOUNTER — Ambulatory Visit (INDEPENDENT_AMBULATORY_CARE_PROVIDER_SITE_OTHER): Payer: Medicare Other | Admitting: Family Medicine

## 2013-07-07 VITALS — BP 138/82 | HR 92 | Temp 99.0°F | Resp 16 | Ht 65.0 in | Wt 132.3 lb

## 2013-07-07 DIAGNOSIS — R109 Unspecified abdominal pain: Secondary | ICD-10-CM

## 2013-07-07 DIAGNOSIS — K589 Irritable bowel syndrome without diarrhea: Secondary | ICD-10-CM

## 2013-07-07 DIAGNOSIS — S76319A Strain of muscle, fascia and tendon of the posterior muscle group at thigh level, unspecified thigh, initial encounter: Secondary | ICD-10-CM

## 2013-07-07 DIAGNOSIS — R197 Diarrhea, unspecified: Secondary | ICD-10-CM

## 2013-07-07 DIAGNOSIS — R110 Nausea: Secondary | ICD-10-CM

## 2013-07-07 LAB — POCT CBC
Granulocyte percent: 83.2 %G — AB (ref 37–80)
MID (cbc): 0.6 (ref 0–0.9)
MPV: 8.5 fL (ref 0–99.8)
POC MID %: 6.4 %M (ref 0–12)
Platelet Count, POC: 231 10*3/uL (ref 142–424)
RBC: 5.16 M/uL (ref 4.04–5.48)

## 2013-07-07 MED ORDER — HYOSCYAMINE SULFATE 0.125 MG SL SUBL: 0.1250 mg | SUBLINGUAL_TABLET | SUBLINGUAL | Status: DC | PRN

## 2013-07-07 MED ORDER — ONDANSETRON 8 MG PO TBDP: 8.0000 mg | ORAL_TABLET | Freq: Three times a day (TID) | ORAL | Status: DC | PRN

## 2013-07-07 MED ORDER — TRAMADOL HCL 50 MG PO TABS: 50.0000 mg | ORAL_TABLET | Freq: Three times a day (TID) | ORAL | Status: DC | PRN

## 2013-07-07 NOTE — Progress Notes (Signed)
Subjective:  This chart was scribed for Ruth Sorenson, MD by Ruth Walker, Medical Scribe. This patient was seen in Room 5 and the patient's care was started at 10:15 AM.   Patient ID: Ruth Walker, female    DOB: 1945/12/11, 67 y.o.   MRN: [id number] Chief Complaint  Patient presents with  . Abdominal Pain    Has HX of IBS   . Diarrhea    HPI HPI Comments: Ruth Walker is a 67 y.o. female with a history of IBS who presents to the [facility] complaining of constant, cramping abdominal pain due to gas that started this morning w/ nausea.  She states that yesterday afternoon she ate ice cream, half a jar of walnuts and peanuts and drank Seagrams.  She was well aware that all these things tend to flair up her IBS but it was a holiday party and she just wanted to indulge. Her IBS had been well controlled by diet for a long time so she thought she could cheat a little without to many consequences but now is severely regretting her choise.  She states that she did not eat dinner last night but she drank water.    She denies, fever, chills, dizziness, lightheadedness, and constipation as an associated symptom.   She states that she felt chills last night but they have since subsided.  She states that she have about 8 bowel movements in the past two days.  She states that her first bowel movement was brown and soft they have progressively gotten lighter.  She states that they are still soft but water colored.  She states that her grandchildren have had episodes of emesis.  The patient denies taking any OTC medications for her symptoms.  She states that her GI doctor is Dr. Jarold Motto.     Past Medical History  Diagnosis Date  . Hemorrhoids   . Irritable bowel syndrome (IBS)   . Asthma     Gets allergy shots once a week   . Skin cancer     Leg  . Cholelithiasis   . Diverticulosis   . Anal fissure   . Anxiety states   . Uterine polyp   . Osteoporosis   . Vertebral  compression fracture   . Environmental allergies   . Allergy   . Depression    Past Surgical History  Procedure Laterality Date  . Cesarean section    . Hemorrhoid surgery    . Back surgery    . Skin cancer excision      Leg   . Endometrial polypectomy    . Hysteroscopy    . Dilation and curettage of uterus    . Mouth surgery    . Fracture surgery      neck (vertebrae)   Family History  Problem Relation Age of Onset  . Diabetes Mother   . Hypertension Mother   . Thyroid disease Mother   . Colon cancer Neg Hx   . Hypertension Father   . Heart disease Father   . Thyroid disease Sister    History   Social History  . Marital Status: Married    Spouse Name: N/A    Number of Children: 2  . Years of Education: N/A   Occupational History  . Retired    Social History Main Topics  . Smoking status: Never Smoker   . Smokeless tobacco: Never Used  . Alcohol Use: No  . Drug Use: No  .  Sexual Activity: Yes    Birth Control/ Protection: Post-menopausal     Comment: number of sex partners in the last 12 months  1   Other Topics Concern  . Not on file   Social History Narrative   Patient gets regular exercise   Allergies  Allergen Reactions  . Aloe   . Benadryl [Diphenhydramine Hcl] Other (See Comments)    Makes her feel "hyper"  . Cephalexin     REACTION: coarse rash  . Ciprofloxacin   . Mucinex [Guaifenesin Er] Other (See Comments)    Makes her feel "hyper".  . Shellfish Allergy Hives  . Sudafed [Pseudoephedrine Hcl] Hives and Other (See Comments)    Makes her feel "hyper"  . Sulfonamide Derivatives      Review of Systems  Constitutional: Positive for chills and appetite change (decreased). Negative for fever, diaphoresis, activity change and unexpected weight change.  Gastrointestinal: Positive for nausea, abdominal pain and diarrhea. Negative for constipation.  Genitourinary: Negative for decreased urine volume.  Allergic/Immunologic: Negative for  immunocompromised state.  Neurological: Negative for dizziness, weakness, light-headedness and headaches.  Psychiatric/Behavioral: Negative for sleep disturbance.     BP 138/82  Pulse 92  Temp(Src) 99 F (37.2 C) (Oral)  Resp 16  Ht 5\' 5"  (1.651 m)  Wt 132 lb 4.8 oz (60.011 kg)  BMI 22.02 kg/m2  SpO2 100% Objective:  Physical Exam  Nursing note and vitals reviewed. Constitutional: She is oriented to person, place, and time. She appears well-developed and well-nourished. No distress.  HENT:  Head: Normocephalic and atraumatic.  Eyes: Conjunctivae and EOM are normal. Pupils are equal, round, and reactive to light.  Neck: Normal range of motion. Neck supple.  Cardiovascular: Normal rate, regular rhythm and normal heart sounds.  Exam reveals no gallop and no friction rub.   No murmur heard. Pulmonary/Chest: Effort normal and breath sounds normal. No respiratory distress. She has no wheezes. She has no rhonchi. She has no rales.  Abdominal: Soft. Normal appearance and bowel sounds are normal. She exhibits distension. There is generalized tenderness. There is no rebound.  Neurological: She is alert and oriented to person, place, and time.  Skin: Skin is warm and dry.  Psychiatric: She has a normal mood and affect. Her behavior is normal.     Results for orders placed in visit on 07/07/13  POCT CBC      Result Value Range   WBC 9.9  4.6 - 10.2 K/uL   Lymph, poc 1.0  0.6 - 3.4   POC LYMPH PERCENT 10.4  10 - 50 %L   MID (cbc) 0.6  0 - 0.9   POC MID % 6.4  0 - 12 %M   POC Granulocyte 8.2 (*) 2 - 6.9   Granulocyte percent 83.2 (*) 37 - 80 %G   RBC 5.16  4.04 - 5.48 M/uL   Hemoglobin 15.7  12.2 - 16.2 g/dL   HCT, POC 16.1 (*) 09.6 - 47.9 %   MCV 94.6  80 - 97 fL   MCH, POC 30.4  27 - 31.2 pg   MCHC 32.2  31.8 - 35.4 g/dL   RDW, POC 04.5     Platelet Count, POC 231  142 - 424 K/uL   MPV 8.5  0 - 99.8 fL      Assessment & Plan:  Irritable bowel syndrome - Plan: POCT CBC -  suspect flair of IBS as cause due to dietary indiscretions which she knows flairs her IBS - start  trial of levsin for cramping and diarrhea.  Prn zofran, ok to use prn imodium in addition if needed but careful as don't want to tip herself into constipation so wait between meds to fully judge their effect.  IBS (irritable bowel syndrome)  Diarrhea - Plan: POCT CBC  Abdominal pain, unspecified site - Plan: POCT CBC  Nausea alone  Hamstring muscle strain, unspecified laterality, initial encounter - Plan: traMADol (ULTRAM) 50 MG tablet  Meds ordered this encounter  Medications  . hyoscyamine (LEVSIN SL) 0.125 MG SL tablet    Sig: Place 1-2 tablets (0.125-0.25 mg total) under the tongue every 4 (four) hours as needed.    Dispense:  30 tablet    Refill:  0  . traMADol (ULTRAM) 50 MG tablet    Sig: Take 1 tablet (50 mg total) by mouth every 8 (eight) hours as needed.    Dispense:  30 tablet    Refill:  0  . ondansetron (ZOFRAN-ODT) 8 MG disintegrating tablet    Sig: Take 1 tablet (8 mg total) by mouth every 8 (eight) hours as needed for nausea.    Dispense:  30 tablet    Refill:  0    I personally performed the services described in this documentation, which was scribed in my presence. The recorded information has been reviewed and considered, and addended by me as needed.  Ruth Sorenson, MD MPH

## 2013-07-07 NOTE — Patient Instructions (Signed)
Diet and Irritable Bowel Syndrome   No cure has been found for irritable bowel syndrome (IBS). Many options are available to treat the symptoms. Your caregiver will give you the best treatments available for your symptoms. He or she will also encourage you to manage stress and to make changes to your diet. You need to work with your caregiver and Registered Dietician to find the best combination of medicine, diet, counseling, and support to control your symptoms. The following are some diet suggestions.  FOODS THAT MAKE IBS WORSE  · Fatty foods, such as French fries.  · Milk products, such as cheese or ice cream.  · Chocolate.  · Alcohol.  · Caffeine (found in coffee and some sodas).  · Carbonated drinks, such as soda.  If certain foods cause symptoms, you should eat less of them or stop eating them.  FOOD JOURNAL   · Keep a journal of the foods that seem to cause distress. Write down:  · What you are eating during the day and when.  · What problems you are having after eating.  · When the symptoms occur in relation to your meals.  · What foods always make you feel badly.  · Take your notes with you to your caregiver to see if you should stop eating certain foods.  FOODS THAT MAKE IBS BETTER  Fiber reduces IBS symptoms, especially constipation, because it makes stools soft, bulky, and easier to pass. Fiber is found in bran, bread, cereal, beans, fruit, and vegetables. Examples of foods with fiber include:  · Apples.  · Peaches.  · Pears.  · Berries.  · Figs.  · Broccoli, raw.  · Cabbage.  · Carrots.  · Raw peas.  · Kidney beans.  · Lima beans.  · Whole-grain bread.  · Whole-grain cereal.  Add foods with fiber to your diet a little at a time. This will let your body get used to them. Too much fiber at once might cause gas and swelling of your abdomen. This can trigger symptoms in a person with IBS. Caregivers usually recommend a diet with enough fiber to produce soft, painless bowel movements. High fiber diets may  cause gas and bloating. However, these symptoms often go away within a few weeks, as your body adjusts.  In many cases, dietary fiber may lessen IBS symptoms, particularly constipation. However, it may not help pain or diarrhea. High fiber diets keep the colon mildly enlarged (distended) with the added fiber. This may help prevent spasms in the colon. Some forms of fiber also keep water in the stool, thereby preventing hard stools that are difficult to pass.   Besides telling you to eat more foods with fiber, your caregiver may also tell you to get more fiber by taking a fiber pill or drinking water mixed with a special high fiber powder. An example of this is a natural fiber laxative containing psyllium seed.   TIPS  · Large meals can cause cramping and diarrhea in people with IBS. If this happens to you, try eating 4 or 5 small meals a day, or try eating less at each of your usual 3 meals. It may also help if your meals are low in fat and high in carbohydrates. Examples of carbohydrates are pasta, rice, whole-grain breads and cereals, fruits, and vegetables.  · If dairy products cause your symptoms to flare up, you can try eating less of those foods. You might be able to handle yogurt better than other dairy products, because   it contains bacteria that helps with digestion. Dairy products are an important source of calcium and other nutrients. If you need to avoid dairy products, be sure to talk with a Registered Dietitian about getting these nutrients through other food sources.  · Drink enough water and fluids to keep your urine clear or pale yellow. This is important, especially if you have diarrhea.  FOR MORE INFORMATION   International Foundation for Functional Gastrointestinal Disorders: www.iffgd.org   National Digestive Diseases Information Clearinghouse: digestive.niddk.nih.gov  Document Released: 09/16/2003 Document Revised: 09/18/2011 Document Reviewed: 06/03/2007  Exit[employer]® Patient Information ©2014  [employer].

## 2013-07-15 ENCOUNTER — Ambulatory Visit (INDEPENDENT_AMBULATORY_CARE_PROVIDER_SITE_OTHER): Payer: Medicare Other | Admitting: Internal Medicine

## 2013-07-15 VITALS — BP 126/80 | HR 85 | Temp 98.6°F | Resp 18 | Ht 65.0 in | Wt 131.0 lb

## 2013-07-15 DIAGNOSIS — J019 Acute sinusitis, unspecified: Secondary | ICD-10-CM

## 2013-07-15 DIAGNOSIS — J309 Allergic rhinitis, unspecified: Secondary | ICD-10-CM

## 2013-07-15 MED ORDER — AMOXICILLIN 500 MG PO CAPS: 1000.0000 mg | ORAL_CAPSULE | Freq: Two times a day (BID) | ORAL | Status: DC

## 2013-07-15 MED ORDER — METHYLPREDNISOLONE ACETATE 80 MG/ML IJ SUSP
80.0000 mg | Freq: Once | INTRAMUSCULAR | Status: AC
  Administered 2013-07-15: 80 mg via INTRAMUSCULAR

## 2013-07-15 NOTE — Progress Notes (Deleted)
   Subjective:    Patient ID: [name], female    DOB: 09-29-1945, 68 y.o.   MRN: [id number]  HPI    Review of Systems     Objective:   Physical Exam        Assessment & Plan:   Subjective:     AHUVA POYNOR is a 68 y.o. female who presents for evaluation of chronic sinus problems. Symptoms include {nasal symptoms:17838}. Patient has had approximately {0-10:33138} acute sinus infections in the past year. Patient {admits to:19208} a history of allergic rhinitis. Patient {admits to:19208} chronic use of OTC nasal decongestant sprays. Previous treatment has included {sinusitis [phone]}, which have given {relief:12621} relief. Patient is a {smoker:15292}.  {Common ambulatory SmartLinks:19316}  Review of Systems {ros - complete:30496}    Objective:    {Exam, Complete:17964}    Assessment:    Chronic {bacterial:15296} sinusitis.    Plan:    {Plan; sinusitis:[phone]}

## 2013-07-15 NOTE — Patient Instructions (Signed)

## 2013-07-15 NOTE — Progress Notes (Signed)
   Subjective:    Patient ID: Ruth Walker, female    DOB: [birth year], 68 y.o.   MRN: [id number]  HPI Allergic rhinitis with early sinus infection, facial pain. Requests depomedrol shot. Dr. Donneta Romberg her allergist, is on shots.   Review of Systems     Objective:   Physical Exam  Constitutional: She is oriented to person, place, and time. She appears well-developed and well-nourished. No distress.  HENT:  Head: Normocephalic.  Right Ear: External ear normal.  Left Ear: External ear normal.  Nose: Mucosal edema, rhinorrhea and sinus tenderness present. Right sinus exhibits maxillary sinus tenderness. Right sinus exhibits no frontal sinus tenderness. Left sinus exhibits maxillary sinus tenderness. Left sinus exhibits no frontal sinus tenderness.  Mouth/Throat: Oropharynx is clear and moist.  Eyes: EOM are normal.  Neck: Normal range of motion.  Pulmonary/Chest: Effort normal.  Musculoskeletal: Normal range of motion.  Neurological: She is alert and oriented to person, place, and time. She exhibits normal muscle tone. Coordination normal.  Psychiatric: She has a normal mood and affect.          Assessment & Plan:  Amoxil/Depomedrol IM

## 2013-07-20 ENCOUNTER — Ambulatory Visit (INDEPENDENT_AMBULATORY_CARE_PROVIDER_SITE_OTHER): Payer: Medicare Other | Admitting: Family Medicine

## 2013-07-20 VITALS — BP 120/82 | HR 80 | Temp 99.1°F | Resp 18 | Ht 65.0 in | Wt 131.0 lb

## 2013-07-20 DIAGNOSIS — J329 Chronic sinusitis, unspecified: Secondary | ICD-10-CM

## 2013-07-20 DIAGNOSIS — IMO0002 Reserved for concepts with insufficient information to code with codable children: Secondary | ICD-10-CM

## 2013-07-20 DIAGNOSIS — M79671 Pain in right foot: Secondary | ICD-10-CM

## 2013-07-20 DIAGNOSIS — M79609 Pain in unspecified limb: Secondary | ICD-10-CM

## 2013-07-20 DIAGNOSIS — S46911A Strain of unspecified muscle, fascia and tendon at shoulder and upper arm level, right arm, initial encounter: Secondary | ICD-10-CM

## 2013-07-20 NOTE — Progress Notes (Addendum)
Patient ID: NONNA RENNINGER MRN: [id number], DOB: 02-Jan-1946, 68 y.o. Date of Encounter: 07/20/2013, 12:10 PM This chart was scribed for [name], MD by Vernell Barrier, Medical Scribe. This patient's care was started at 12:10 PM  Primary Physician: [name], MD  Chief Complaint: follow up  HPI: 68 y.o. year old female with history below presents for a follow up. Started antibiotic 5 days ago to treat sinus infection. States she is feeling much better but there is still some post nasal drip present. States she took Claritin for 2 days and stopped after it provided no relief. Pt states sx started when she returned from the beach during the Christmas holiday. Pt takes regular allergy shots but states she has not been in the past 2 weeks. Pt has been to the gym twice this week and exercises regularly. Pt is married with 2 children. Pt is retired.  Pt reports secondary minor complaints. Presents with pain in her right shoulder she states began upon reaching back to the backseat in her car. She states the pain is only present when the performs the same motion. Pt also reports feeling a knot in her right heel that is painful to walk on.  She otherwise feels fine.   Past Medical History  Diagnosis Date   Hemorrhoids    Irritable bowel syndrome (IBS)    Asthma     Gets allergy shots once a week    Skin cancer     Leg   Cholelithiasis    Diverticulosis    Anal fissure    Anxiety states    Uterine polyp    Osteoporosis    Vertebral compression fracture    Environmental allergies    Allergy    Depression      Home Meds: Prior to Admission medications   Medication Sig Start Date End Date Taking? Authorizing Provider  amoxicillin (AMOXIL) 500 MG capsule Take 2 capsules (1,000 mg total) by mouth 2 (two) times daily. 07/15/13  Yes Orma Flaming, MD  aspirin 81 MG tablet Take 81 mg by mouth daily.     Yes Historical Provider, MD  Calcium Carbonate-Vit D-Min (CALCIUM  1200) 1200-1000 MG-UNIT CHEW Chew 1 tablet by mouth daily.     Yes Historical Provider, MD  cycloSPORINE (RESTASIS) 0.05 % ophthalmic emulsion 1 drop 2 (two) times daily.     Yes Historical Provider, MD  estradiol (ESTRACE VAGINAL) 0.1 MG/GM vaginal cream Place vaginally. As directed    Yes Historical Provider, MD  fluticasone (FLONASE) 50 MCG/ACT nasal spray Use 2 sprays in each nostril twice daily for 3 days, then daily 06/13/13  Yes Posey Boyer, MD  hydrochlorothiazide (HYDRODIURIL) 25 MG tablet Take 1 tablet (25 mg total) by mouth daily. 06/23/13  Yes Terrance Mass, MD  hydrocortisone (ANUSOL-HC) 25 MG suppository Use 1 suppository at bedtime. 02/11/13  Yes Amy S Esterwood, PA-C  hyoscyamine (LEVSIN SL) 0.125 MG SL tablet Place 1-2 tablets (0.125-0.25 mg total) under the tongue every 4 (four) hours as needed. 07/07/13  Yes Shawnee Knapp, MD  lidocaine-hydrocortisone (ANAMANTEL HC) 3-0.5 % CREA Place 1 Applicatorful rectally 2 (two) times daily. 02/11/13  Yes Amy S Esterwood, PA-C  loratadine (CLARITIN) 10 MG tablet Take 10 mg by mouth daily.   Yes Historical Provider, MD  meloxicam (MOBIC) 15 MG tablet  01/14/13  Yes Historical Provider, MD  metoprolol tartrate (LOPRESSOR) 25 MG tablet Take 1 tablet (25 mg total) by mouth 2 (two) times daily. 04/01/13  Yes  [name], MD  mupirocin ointment (BACTROBAN) 2 % Apply topically 2 (two) times daily. 06/18/13  Yes [name], MD  ondansetron (ZOFRAN-ODT) 8 MG disintegrating tablet Take 1 tablet (8 mg total) by mouth every 8 (eight) hours as needed for nausea. 07/07/13  Yes Shawnee Knapp, MD  polyethylene glycol [facility] / GLYCOLAX) packet Take 17 g by mouth daily. dissolve in 8 oz. Of water at bedtime    Yes Historical Provider, MD  PRESCRIPTION MEDICATION Allergy injection once a week.[facility] [employer] [facility]   Yes Historical Provider, MD  Probiotic Product (ALIGN) 4 MG CAPS Take 1 capsule by mouth daily. 07/05/11  Yes Sable Feil, MD    traMADol (ULTRAM) 50 MG tablet Take 1 tablet (50 mg total) by mouth every 8 (eight) hours as needed. 07/07/13  Yes Shawnee Knapp, MD  trimethoprim (TRIMPEX) 100 MG tablet Take 100 mg by mouth daily.     Yes Historical Provider, MD  Vitamin D, Ergocalciferol, (DRISDOL) 50000 UNITS CAPS capsule Take 1 capsule (50,000 Units total) by mouth every 7 (seven) days. 04/28/13  Yes Terrance Mass, MD  Wheat Dextrin (BENEFIBER DRINK MIX PO) Take 10 mLs by mouth every morning. Take 2 teaspoons every morning     Yes Historical Provider, MD    Allergies:  Allergies  Allergen Reactions   Aloe    Benadryl [Diphenhydramine Hcl] Other (See Comments)    Makes her feel "hyper"   Cephalexin     REACTION: coarse rash   Ciprofloxacin    Mucinex [Guaifenesin Er] Other (See Comments)    Makes her feel "hyper".   Shellfish Allergy Hives   Sudafed [Pseudoephedrine Hcl] Hives and Other (See Comments)    Makes her feel "hyper"   Sulfonamide Derivatives     History   Social History   Marital Status: Married    Spouse Name: N/A    Number of Children: 2   Years of Education: N/A   Occupational History   Retired    Social History Main Topics   Smoking status: Never Smoker    Smokeless tobacco: Never Used   Alcohol Use: No   Drug Use: No   Sexual Activity: Yes    Museum/gallery curator: Post-menopausal     Comment: number of sex partners in the last 50 months  1   Other Topics Concern   Not on file   Social History Narrative   Patient gets regular exercise     Review of Systems: Constitutional: negative for chills, fever, night sweats, weight changes, or fatigue  HEENT: negative for vision changes, hearing loss, congestion, rhinorrhea, ST, epistaxis, or sinus pressure. Positive for postnasal drip. Cardiovascular: negative for chest pain or palpitations Respiratory: negative for hemoptysis, wheezing, shortness of breath, or cough Abdominal: negative for abdominal pain,  nausea, vomiting, diarrhea, or constipation Dermatological: negative for rash Neurologic: negative for headache, dizziness, or syncope All other systems reviewed and are otherwise negative with the exception to those above and in the HPI.   Physical Exam Blood pressure 120/82, pulse 80, temperature 99.1 F (37.3 C), temperature source Oral, resp. rate 18, height 5\' 5"  (1.651 m), weight 131 lb (59.421 kg), SpO2 100.00%., Body mass index is 21.8 kg/(m^2). General: Well developed, well nourished, in no acute distress. Head: Normocephalic, atraumatic, eyes without discharge, sclera non-icteric, nares are without discharge. Bilateral auditory canals clear, TM's are without perforation, pearly grey and translucent with reflective cone of light bilaterally. Oral cavity moist, posterior pharynx without exudate,  erythema, peritonsillar abscess, or post nasal drip.  Neck: Supple. No thyromegaly. Full ROM. No lymphadenopathy. Lungs: Clear bilaterally to auscultation without wheezes, rales, or rhonchi. Breathing is unlabored. Heart: RRR with S1 S2. No murmurs, rubs, or gallops appreciated. Abdomen: Soft, non-tender, non-distended with normoactive bowel sounds. No hepatomegaly. No rebound/guarding. No obvious abdominal masses. Msk:  Strength and tone normal for age. Extremities/Skin: Warm and dry. No clubbing or cyanosis. No edema. No rashes or suspicious lesions. Full range of motion of right shoulder with no point tenderness. Right heel is mildly erythematous and tender over the proximal calcaneus without swelling Neuro: Alert and oriented X 3. Moves all extremities spontaneously. Gait is normal. CNII-XII grossly in tact. Psych:  Responds to questions appropriately with a normal affect.   ASSESSMENT AND PLAN:  68 y.o. year old female here for right heel pain, mild brief right shoulder pain, and resolving sinusitis No diagnosis found.  Right shoulder strain, initial encounter  Heel pain,  right  Unspecified sinusitis (chronic)    Signed, [name], MD 07/20/2013 11:53 AM  HPI Review of Systems Physical Exam

## 2013-08-01 ENCOUNTER — Encounter: Payer: Self-pay | Admitting: Gynecology

## 2013-08-09 ENCOUNTER — Ambulatory Visit (INDEPENDENT_AMBULATORY_CARE_PROVIDER_SITE_OTHER): Payer: Medicare Other | Admitting: Family Medicine

## 2013-08-09 VITALS — BP 116/74 | HR 105 | Temp 98.3°F | Resp 18 | Ht 65.0 in | Wt 131.0 lb

## 2013-08-09 DIAGNOSIS — R197 Diarrhea, unspecified: Secondary | ICD-10-CM

## 2013-08-09 DIAGNOSIS — J3489 Other specified disorders of nose and nasal sinuses: Secondary | ICD-10-CM

## 2013-08-09 NOTE — Progress Notes (Signed)
Subjective:    Patient ID: Ruth Walker, female    DOB: [birth year], 68 y.o.   MRN: [id number]  HPI Chief Complaint  Patient presents with   Nausea    after eating subway    Diarrhea   sinus drainage   This chart was scribed for [name], MD by Thea Alken, ED Scribe. This patient was seen in room 1 and the patient's care was started at 2:56 PM.  HPI Comments: Ruth Walker is a 68 y.o. female who presents to the [facility] complaining of nausea, diarrhea, and sinus drainage. She states that a family member had a cold a couple weeks ago and believes that she has contracted some similar symptoms. She also reports rhinorrhea and congestion. She reports that she ordered a teriyaki sub from [employer] last night and half way through her sandwich she started feeling sick, she believes it might be IBS. She reports that her stomach has been growling and rumbling.She reports that she had a breakfast bar and water this morning and feels as if she needs to vomit. Pt reports that she takes miralax during the day and benefiber in the morning. Pt reports that she received a shot and now has a red irritation rash on her right arm.   Past Medical History  Diagnosis Date   Hemorrhoids    Irritable bowel syndrome (IBS)    Asthma     Gets allergy shots once a week    Skin cancer     Leg   Cholelithiasis    Diverticulosis    Anal fissure    Anxiety states    Uterine polyp    Osteoporosis    Vertebral compression fracture    Environmental allergies    Allergy    Depression    Allergies  Allergen Reactions   Aloe    Benadryl [Diphenhydramine Hcl] Other (See Comments)    Makes her feel "hyper"   Cephalexin     REACTION: coarse rash   Ciprofloxacin    Mucinex [Guaifenesin Er] Other (See Comments)    Makes her feel "hyper".   Shellfish Allergy Hives   Sudafed [Pseudoephedrine Hcl] Hives and Other (See Comments)    Makes her feel "hyper"     Sulfonamide Derivatives    Prior to Admission medications   Medication Sig Start Date End Date Taking? Authorizing Provider  aspirin 81 MG tablet Take 81 mg by mouth daily.     Yes Historical Provider, MD  Calcium Carbonate-Vit D-Min (CALCIUM 1200) 1200-1000 MG-UNIT CHEW Chew 1 tablet by mouth daily.     Yes Historical Provider, MD  Cranberry Juice Powder 425 MG CAPS Take by mouth.   Yes Historical Provider, MD  cycloSPORINE (RESTASIS) 0.05 % ophthalmic emulsion 1 drop 2 (two) times daily.     Yes Historical Provider, MD  estradiol (ESTRACE VAGINAL) 0.1 MG/GM vaginal cream Place vaginally. As directed    Yes Historical Provider, MD  fluticasone (FLONASE) 50 MCG/ACT nasal spray Use 2 sprays in each nostril twice daily for 3 days, then daily 06/13/13  Yes Posey Boyer, MD  hydrochlorothiazide (HYDRODIURIL) 25 MG tablet Take 1 tablet (25 mg total) by mouth daily. 06/23/13  Yes Terrance Mass, MD  hydrocortisone (ANUSOL-HC) 25 MG suppository Use 1 suppository at bedtime. 02/11/13  Yes Amy S Esterwood, PA-C  hyoscyamine (LEVSIN SL) 0.125 MG SL tablet Place 1-2 tablets (0.125-0.25 mg total) under the tongue every 4 (four) hours as needed. 07/07/13  Yes Shawnee Knapp, MD  loratadine (CLARITIN) 10 MG tablet Take 10 mg by mouth daily.   Yes Historical Provider, MD  meloxicam (MOBIC) 15 MG tablet  01/14/13  Yes Historical Provider, MD  metoprolol tartrate (LOPRESSOR) 25 MG tablet Take 1 tablet (25 mg total) by mouth 2 (two) times daily. 04/01/13  Yes [name], MD  mupirocin ointment (BACTROBAN) 2 % Apply topically 2 (two) times daily. 06/18/13  Yes [name], MD  ondansetron (ZOFRAN-ODT) 8 MG disintegrating tablet Take 1 tablet (8 mg total) by mouth every 8 (eight) hours as needed for nausea. 07/07/13  Yes Shawnee Knapp, MD  polyethylene glycol [facility] / GLYCOLAX) packet Take 17 g by mouth daily. dissolve in 8 oz. Of water at bedtime    Yes Historical Provider, MD  PRESCRIPTION MEDICATION Allergy  injection once a week.[facility] [employer]   Yes Historical Provider, MD  Probiotic Product (ALIGN) 4 MG CAPS Take 1 capsule by mouth daily. 07/05/11  Yes Sable Feil, MD  traMADol (ULTRAM) 50 MG tablet Take 1 tablet (50 mg total) by mouth every 8 (eight) hours as needed. 07/07/13  Yes Shawnee Knapp, MD  Vitamin D, Ergocalciferol, (DRISDOL) 50000 UNITS CAPS capsule Take 1 capsule (50,000 Units total) by mouth every 7 (seven) days. 04/28/13  Yes Terrance Mass, MD  Wheat Dextrin (BENEFIBER DRINK MIX PO) Take 10 mLs by mouth every morning. Take 2 teaspoons every morning     Yes Historical Provider, MD  amoxicillin (AMOXIL) 500 MG capsule Take 2 capsules (1,000 mg total) by mouth 2 (two) times daily. 07/15/13   Orma Flaming, MD  lidocaine-hydrocortisone [facility]) 3-0.5 % CREA Place 1 Applicatorful rectally 2 (two) times daily. 02/11/13   Amy S Esterwood, PA-C  trimethoprim (TRIMPEX) 100 MG tablet Take 100 mg by mouth daily.      Historical Provider, MD   Review of Systems  Constitutional: Negative for fever and chills.  HENT: Positive for rhinorrhea.   Gastrointestinal: Positive for nausea and diarrhea. Negative for vomiting.       Objective:   Physical Exam  Nursing note and vitals reviewed. Constitutional: She is oriented to person, place, and time. She appears well-developed and well-nourished. No distress.  HENT:  Head: Normocephalic and atraumatic.  Eyes: EOM are normal.  Neck: Neck supple. No tracheal deviation present.  Cardiovascular: Normal rate and regular rhythm.   Pulmonary/Chest: Effort normal and breath sounds normal. No respiratory distress.  Abdominal: Bowel sounds are increased.  Musculoskeletal: Normal range of motion.  Neurological: She is alert and oriented to person, place, and time.  Skin: Skin is warm and dry.  Psychiatric: She has a normal mood and affect. Her behavior is normal.   Filed Vitals:   08/09/13 1419  BP: 116/74  Pulse: 105  Temp:  98.3 F (36.8 C)  Resp: 18   Abdominal exam shows no HSM or masses, no localized tenderness, mild distention.    Assessment & Plan:   Patient advised to stick with chicken broth and crackers for the next couple days and call back if symptoms do not clear.

## 2013-08-09 NOTE — Patient Instructions (Signed)
Diarrhea Diarrhea is frequent loose and watery bowel movements. It can cause you to feel weak and dehydrated. Dehydration can cause you to become tired and thirsty, have a dry mouth, and have decreased urination that often is dark yellow. Diarrhea is a sign of another problem, most often an infection that will not last long. In most cases, diarrhea typically lasts 2 3 days. However, it can last longer if it is a sign of something more serious. It is important to treat your diarrhea as directed by your caregive to lessen or prevent future episodes of diarrhea. CAUSES  Some common causes include:  Gastrointestinal infections caused by viruses, bacteria, or parasites.  Food poisoning or food allergies.  Certain medicines, such as antibiotics, chemotherapy, and laxatives.  Artificial sweeteners and fructose.  Digestive disorders. HOME CARE INSTRUCTIONS  Ensure adequate fluid intake (hydration): have 1 cup (8 oz) of fluid for each diarrhea episode. Avoid fluids that contain simple sugars or sports drinks, fruit juices, whole milk products, and sodas. Your urine should be clear or pale yellow if you are drinking enough fluids. Hydrate with an oral rehydration solution that you can purchase at pharmacies, retail stores, and online. You can prepare an oral rehydration solution at home by mixing the following ingredients together:    tsp table salt.   tsp baking soda.   tsp salt substitute containing potassium chloride.  1  tablespoons sugar.  1 L (34 oz) of water.  Certain foods and beverages may increase the speed at which food moves through the gastrointestinal (GI) tract. These foods and beverages should be avoided and include:  Caffeinated and alcoholic beverages.  High-fiber foods, such as raw fruits and vegetables, nuts, seeds, and whole grain breads and cereals.  Foods and beverages sweetened with sugar alcohols, such as xylitol, sorbitol, and mannitol.  Some foods may be well  tolerated and may help thicken stool including:  Starchy foods, such as rice, toast, pasta, low-sugar cereal, oatmeal, grits, baked potatoes, crackers, and bagels.  Bananas.  Applesauce.  Add probiotic-rich foods to help increase healthy bacteria in the GI tract, such as yogurt and fermented milk products.  Wash your hands well after each diarrhea episode.  Only take over-the-counter or prescription medicines as directed by your caregiver.  Take a warm bath to relieve any burning or pain from frequent diarrhea episodes. SEEK IMMEDIATE MEDICAL CARE IF:   You are unable to keep fluids down.  You have persistent vomiting.  You have blood in your stool, or your stools are black and tarry.  You do not urinate in 6 8 hours, or there is only a small amount of very dark urine.  You have abdominal pain that increases or localizes.  You have weakness, dizziness, confusion, or lightheadedness.  You have a severe headache.  Your diarrhea gets worse or does not get better.  You have a fever or persistent symptoms for more than 2 3 days.  You have a fever and your symptoms suddenly get worse. MAKE SURE YOU:   Understand these instructions.  Will watch your condition.  Will get help right away if you are not doing well or get worse. Document Released: 06/16/2002 Document Revised: 06/12/2012 Document Reviewed: 03/03/2012 Exit[employer] Patient Information 2014 [employer].  

## 2013-09-11 ENCOUNTER — Ambulatory Visit (INDEPENDENT_AMBULATORY_CARE_PROVIDER_SITE_OTHER): Payer: Medicare Other | Admitting: Physician Assistant

## 2013-09-11 ENCOUNTER — Encounter: Payer: Self-pay | Admitting: Physician Assistant

## 2013-09-11 VITALS — BP 144/80 | HR 80 | Ht 65.0 in | Wt 135.4 lb

## 2013-09-11 DIAGNOSIS — K649 Unspecified hemorrhoids: Secondary | ICD-10-CM

## 2013-09-11 NOTE — Progress Notes (Signed)
Reviewed and agree with management plan.  Darsh Vandevoort T. Skye Rodarte, MD FACG 

## 2013-09-11 NOTE — Patient Instructions (Signed)
Stay on Benefiber, Align probiotic and Miralax as you have been using it.    Get Balmex cream at the pharmacy or Vladimir Faster for skin irritation.   Speak to your Primary Care Physician about discussing anxiety medication.

## 2013-09-11 NOTE — Progress Notes (Signed)
Subjective:    Patient ID: Ruth Walker, female    DOB: 14-Apr-1946, 68 y.o.   MRN: [id number]  HPI  [name]  is a very nice 68 year old female known to Dr. [name] previously with history of recurrent anal fissures and hemorrhoids. She did have her remote hemorrhoidectomy. As She has chronic issues with anxiety and OCD. She last had colonoscopy in March of 2010 was found to have mild diverticulosis in the left colon otherwise negative exam. She was last seen in August of 2014 with complaints of external hemorrhoids and was treated symptomatically. She comes in today because she is concerned about a knot  on her rectum. Actually she seems most focused  during this interview on her problems with anxiety and issues with her husband. She says she just needed to talk.  She admits that she has had the bump on the outside of her rectum for some time but she worries about it. She's not had any recent bleeding. She is on a good bowel regimen with align ,Benefiber, and MiraLax and is having regular bowel movements. She has had some rectal irritation primarily of her skin but says she's not having any rectal pain.    Review of Systems  Constitutional: Negative.   HENT: Negative.   Eyes: Negative.   Respiratory: Negative.   Cardiovascular: Negative.   Gastrointestinal: Positive for rectal pain.  Endocrine: Negative.   Genitourinary: Negative.   Musculoskeletal: Negative.   Allergic/Immunologic: Negative.   Neurological: Negative.   Hematological: Negative.   Psychiatric/Behavioral: Negative.    Outpatient Prescriptions Prior to Visit  Medication Sig Dispense Refill  . aspirin 81 MG tablet Take 81 mg by mouth daily.        . Cranberry Juice Powder 425 MG CAPS Take by mouth.      . cycloSPORINE (RESTASIS) 0.05 % ophthalmic emulsion 1 drop 2 (two) times daily.        Marland Kitchen estradiol (ESTRACE VAGINAL) 0.1 MG/GM vaginal cream Place vaginally. As directed       . hydrochlorothiazide (HYDRODIURIL) 25  MG tablet Take 1 tablet (25 mg total) by mouth daily.  30 tablet  3  . loratadine (CLARITIN) 10 MG tablet Take 10 mg by mouth as needed.       . metoprolol tartrate (LOPRESSOR) 25 MG tablet Take 1 tablet (25 mg total) by mouth 2 (two) times daily.  60 tablet  11  . polyethylene glycol (MIRALAX / GLYCOLAX) packet Take 17 g by mouth daily. dissolve in 8 oz. Of water at bedtime       . PRESCRIPTION MEDICATION Allergy injection once a week.[facility] [employer]      . Probiotic Product (ALIGN) 4 MG CAPS Take 1 capsule by mouth daily.  21 capsule  0  . Vitamin D, Ergocalciferol, (DRISDOL) 50000 UNITS CAPS capsule Take 1 capsule (50,000 Units total) by mouth every 7 (seven) days.  5 capsule  5  . Wheat Dextrin (BENEFIBER DRINK MIX PO) Take 10 mLs by mouth every morning. Take 2 teaspoons every morning        . fluticasone (FLONASE) 50 MCG/ACT nasal spray Use 2 sprays in each nostril twice daily for 3 days, then daily  16 g  1  . Calcium Carbonate-Vit D-Min (CALCIUM 1200) 1200-1000 MG-UNIT CHEW Chew 1 tablet by mouth daily.        . hydrocortisone (ANUSOL-HC) 25 MG suppository Use 1 suppository at bedtime.  10 suppository  1  . hyoscyamine (LEVSIN SL)  0.125 MG SL tablet Place 1-2 tablets (0.125-0.25 mg total) under the tongue every 4 (four) hours as needed.  30 tablet  0  . lidocaine-hydrocortisone (ANAMANTEL HC) 3-0.5 % CREA Place 1 Applicatorful rectally 2 (two) times daily.  28.35 g  1  . meloxicam (MOBIC) 15 MG tablet       . mupirocin ointment (BACTROBAN) 2 % Apply topically 2 (two) times daily.  22 g  0  . ondansetron (ZOFRAN-ODT) 8 MG disintegrating tablet Take 1 tablet (8 mg total) by mouth every 8 (eight) hours as needed for nausea.  30 tablet  0  . traMADol (ULTRAM) 50 MG tablet Take 1 tablet (50 mg total) by mouth every 8 (eight) hours as needed.  30 tablet  0   No facility-administered medications prior to visit.   Allergies  Allergen Reactions  . Aloe   . Benadryl  [Diphenhydramine Hcl] Other (See Comments)    Makes her feel "hyper"  . Cephalexin     REACTION: coarse rash  . Ciprofloxacin   . Mucinex [Guaifenesin Er] Other (See Comments)    Makes her feel "hyper".  . Shellfish Allergy Hives  . Sudafed [Pseudoephedrine Hcl] Hives and Other (See Comments)    Makes her feel "hyper"  . Sulfonamide Derivatives    Patient Active Problem List   Diagnosis Date Noted  . External hemorrhoids 03/04/2012  . DVT (deep venous thrombosis)   . Uterine polyp   . Osteoporosis   . Vertebral compression fracture   . IBS (irritable bowel syndrome) 09/05/2011  . Constipation, slow transit 09/05/2011  . Anxiety disorder 09/05/2011  . Diverticulosis 11/04/2010  . Gallstones 11/04/2010  . TACHYCARDIA 06/17/2009  . ANAL FISSURE 05/26/2008  . OBSESSIVE-COMPULSIVE PERSONALITY DISORDER 02/28/2008  . HYPERTENSION 02/28/2008  . HEMORRHOIDS 02/28/2008  . ASTHMA 02/28/2008  . IRRITABLE BOWEL SYNDROME 02/28/2008  . CYSTITIS, CHRONIC 02/28/2008   History  Substance Use Topics  . Smoking status: Never Smoker   . Smokeless tobacco: Never Used  . Alcohol Use: No   family history includes Diabetes in her mother; Heart disease in her father; Hypertension in her father and mother; Thyroid disease in her mother and sister. There is no history of Colon cancer.     Objective:   Physical Exam well-developed older white female in no acute distress, pleasant and tenacious as usual blood pressure 144 of her 80 pulse 80 height 5 foot 5 weight 135. HEENT; nontraumatic normocephalic EOMI PERRLA sclera anicteric, Supple; no JVD, Cardiovascular; regular rate and rhythm with S1-S2 no murmur or gallop, Pulm; clear bilaterally, Abdomen; soft nontender nondistended bowel sounds are active, Rectal ;exam she has a small external hemorrhoid which is soft and nonthrombosed slightly tender no tenderness on digital exam and no fissure palpable stool brown and heme-negative, Extremitie;s no  clubbing cyanosis or edema skin warm dry, Psych; mood and affect appropriate she is anxious and talkative        Assessment & Plan:  #83  68 year old female with small external hemorrhoid minimally symptomatic #2 diverticulosis #3 history of recurrent anal fissures no current evidence for fissure #4 status post hemorrhoidectomy #5 IBS #6 anxiety and OCD  Plan; patient is encouraged to Dr. her primary care provider regarding initiating an SSRI She will continue her current bowel regimen with Benefiber MiraLax and align Balmex  cream for external perianal irritation 2-3 times daily when necessary To followup with GI as needed and will be established with Dr. Fuller Plan

## 2013-09-13 ENCOUNTER — Ambulatory Visit (INDEPENDENT_AMBULATORY_CARE_PROVIDER_SITE_OTHER): Payer: Medicare Other | Admitting: Family Medicine

## 2013-09-13 VITALS — BP 120/80 | HR 69 | Temp 98.4°F | Resp 16 | Ht 65.5 in | Wt 133.0 lb

## 2013-09-13 DIAGNOSIS — J3489 Other specified disorders of nose and nasal sinuses: Secondary | ICD-10-CM

## 2013-09-13 DIAGNOSIS — J019 Acute sinusitis, unspecified: Secondary | ICD-10-CM

## 2013-09-13 DIAGNOSIS — R0981 Nasal congestion: Secondary | ICD-10-CM

## 2013-09-13 MED ORDER — FLUTICASONE PROPIONATE 50 MCG/ACT NA SUSP: 2.0000 | Freq: Every day | NASAL | Status: DC

## 2013-09-13 MED ORDER — AMOXICILLIN 875 MG PO TABS: 875.0000 mg | ORAL_TABLET | Freq: Two times a day (BID) | ORAL | Status: DC

## 2013-09-13 NOTE — Progress Notes (Signed)
Chief Complaint:  Chief Complaint  Patient presents with  . Facial Pain    "sinus pressure" x 1 week  . Sore Throat    post-nasal drip, irritated  . Nasal Congestion    "yellow"    HPI: Ruth Walker is a 68 y.o. female who is here for a 1 week history of sinus pressure and worsening allergy sxs.  Began as allergies,  ussually she gets better with her allergy shots, but got alllergies shot yesterday and now things are worse.  She has congestion and cough with green sputum. Denies fevers or chills. She denies ear pain, SOB, wheezing.   Past Medical History  Diagnosis Date  . Hemorrhoids   . Irritable bowel syndrome (IBS)   . Asthma     Gets allergy shots once a week   . Skin cancer     Leg  . Cholelithiasis   . Diverticulosis   . Anal fissure   . Anxiety states   . Uterine polyp   . Osteoporosis   . Vertebral compression fracture   . Environmental allergies   . Allergy   . Depression    Past Surgical History  Procedure Laterality Date  . Cesarean section    . Hemorrhoid surgery    . Back surgery    . Skin cancer excision      Leg   . Endometrial polypectomy    . Hysteroscopy    . Dilation and curettage of uterus    . Mouth surgery    . Fracture surgery      neck (vertebrae)   History   Social History  . Marital Status: Married    Spouse Name: N/A    Number of Children: 2  . Years of Education: N/A   Occupational History  . Retired    Social History Main Topics  . Smoking status: Never Smoker   . Smokeless tobacco: Never Used  . Alcohol Use: No  . Drug Use: No  . Sexual Activity: Yes    Birth Control/ Protection: Post-menopausal     Comment: number of sex partners in the last 60 months  1   Other Topics Concern  . None   Social History Narrative   Patient gets regular exercise   Family History  Problem Relation Age of Onset  . Diabetes Mother   . Hypertension Mother   . Thyroid disease Mother   . Colon cancer Neg Hx   .  Hypertension Father   . Heart disease Father   . Thyroid disease Sister    Allergies  Allergen Reactions  . Aloe   . Benadryl [Diphenhydramine Hcl] Other (See Comments)    Makes her feel "hyper"  . Cephalexin     REACTION: coarse rash  . Ciprofloxacin   . Mucinex [Guaifenesin Er] Other (See Comments)    Makes her feel worse  . Shellfish Allergy Hives  . Sudafed [Pseudoephedrine Hcl] Hives and Other (See Comments)    Keeps her awake  . Sulfonamide Derivatives    Prior to Admission medications   Medication Sig Start Date End Date Taking? Authorizing Provider  aspirin 81 MG tablet Take 81 mg by mouth daily.     Yes Historical Provider, MD  Calcium Carbonate (CALCIUM 600 PO) Take 1 tablet by mouth 2 (two) times daily.   Yes Historical Provider, MD  cycloSPORINE (RESTASIS) 0.05 % ophthalmic emulsion 1 drop 2 (two) times daily.     Yes Historical Provider, MD  estradiol (ESTRACE VAGINAL) 0.1 MG/GM vaginal cream Place vaginally. As directed    Yes Historical Provider, MD  fluticasone (FLONASE) 50 MCG/ACT nasal spray Place 2 sprays into both nostrils as needed. 06/13/13  Yes Posey Boyer, MD  hydrochlorothiazide (HYDRODIURIL) 25 MG tablet Take 1 tablet (25 mg total) by mouth daily. 06/23/13  Yes Terrance Mass, MD  loratadine (CLARITIN) 10 MG tablet Take 10 mg by mouth as needed.    Yes Historical Provider, MD  metoprolol tartrate (LOPRESSOR) 25 MG tablet Take 1 tablet (25 mg total) by mouth 2 (two) times daily. 04/01/13  Yes [name], MD  polyethylene glycol [facility] / GLYCOLAX) packet Take 17 g by mouth daily. dissolve in 8 oz. Of water at bedtime    Yes Historical Provider, MD  PRESCRIPTION MEDICATION Allergy injection once a week.[facility]land [facility] [employer]   Yes Historical Provider, MD  Probiotic Product (ALIGN) 4 MG CAPS Take 1 capsule by mouth daily. 07/05/11  Yes Sable Feil, MD  Vitamin D, Ergocalciferol, (DRISDOL) 50000 UNITS CAPS capsule Take 1 capsule (50,000  Units total) by mouth every 7 (seven) days. 04/28/13  Yes Terrance Mass, MD  Wheat Dextrin (BENEFIBER DRINK MIX PO) Take 10 mLs by mouth every morning. Take 2 teaspoons every morning     Yes Historical Provider, MD     ROS: The patient denies fevers, chills, night sweats, unintentional weight loss, chest pain, palpitations, wheezing, dyspnea on exertion, nausea, vomiting, abdominal pain, dysuria, hematuria, melena, numbness, weakness, or tingling.   All other systems have been reviewed and were otherwise negative with the exception of those mentioned in the HPI and as above.    PHYSICAL EXAM: Filed Vitals:   09/13/13 1606  BP: 120/80  Pulse: 69  Temp: 98.4 F (36.9 C)  Resp: 16  Spo2 99% Filed Vitals:   09/13/13 1606  Height: 5' 5.5" (1.664 m)  Weight: 133 lb (60.328 kg)   Body mass index is 21.79 kg/(m^2).  General: Alert, no acute distress HEENT:  Normocephalic, atraumatic, oropharynx patent. EOMI, PERRLA, tm nl, + sinus tenderness, no exudates Cardiovascular:  Regular rate and rhythm, no rubs murmurs or gallops.  No Carotid bruits, radial pulse intact. No pedal edema.  Respiratory: Clear to auscultation bilaterally.  No wheezes, rales, or rhonchi.  No cyanosis, no use of accessory musculature GI: No organomegaly, abdomen is soft and non-tender, positive bowel sounds.  No masses. Skin: No rashes. Neurologic: Facial musculature symmetric. Psychiatric: Patient is appropriate throughout our interaction. Lymphatic: No cervical lymphadenopathy Musculoskeletal: Gait intact.   LABS: Results for orders placed in visit on 07/07/13  POCT CBC      Result Value Ref Range   WBC 9.9  4.6 - 10.2 K/uL   Lymph, poc 1.0  0.6 - 3.4   POC LYMPH PERCENT 10.4  10 - 50 %L   MID (cbc) 0.6  0 - 0.9   POC MID % 6.4  0 - 12 %M   POC Granulocyte 8.2 (*) 2 - 6.9   Granulocyte percent 83.2 (*) 37 - 80 %G   RBC 5.16  4.04 - 5.48 M/uL   Hemoglobin 15.7  12.2 - 16.2 g/dL   HCT, POC 48.8 (*)  37.7 - 47.9 %   MCV 94.6  80 - 97 fL   MCH, POC 30.4  27 - 31.2 pg   MCHC 32.2  31.8 - 35.4 g/dL   RDW, POC 13.3     Platelet Count, POC 231  142 -  424 K/uL   MPV 8.5  0 - 99.8 fL     EKG/XRAY:   Primary read interpreted by Dr. Marin Comment at [facility].   ASSESSMENT/PLAN: Encounter Diagnoses  Name Primary?  . Acute sinusitis Yes  . Nasal congestion    Rx Flonase, Amoxacillin F/u prn  Gross sideeffects, risk and benefits, and alternatives of medications d/w patient. Patient is aware that all medications have potential sideeffects and we are unable to predict every sideeffect or drug-drug interaction that may occur.  LE, [place], DO 09/13/2013 5:53 PM

## 2013-09-13 NOTE — Patient Instructions (Signed)

## 2013-10-05 ENCOUNTER — Other Ambulatory Visit: Payer: Self-pay | Admitting: Gynecology

## 2013-10-06 ENCOUNTER — Telehealth: Payer: Self-pay | Admitting: *Deleted

## 2013-10-06 MED ORDER — HYDROCHLOROTHIAZIDE 25 MG PO TABS: 25.0000 mg | ORAL_TABLET | Freq: Every day | ORAL | Status: DC

## 2013-10-06 NOTE — Telephone Encounter (Signed)
Please go ahead and give her 30 tablets with 3 refills. According to her notes she has history of hypertension in her PCP must have been prescribing if her blood pressure.

## 2013-10-06 NOTE — Telephone Encounter (Signed)
Rx sent 

## 2013-10-06 NOTE — Telephone Encounter (Signed)
Dr. Verline Lema is her primary that has been treating her for HTN. She needs to contact their office for refill.

## 2013-10-06 NOTE — Telephone Encounter (Signed)
Pt Rx for HCTZ 25 mg 1 by mouth daily was denied today. Dr.G originally wrote Rx for patient, you last filled for her on 06/23/13 with 3 refills. She has only 5 pills left. PCP has not been prescribing medication. Please advise

## 2013-12-02 ENCOUNTER — Ambulatory Visit (INDEPENDENT_AMBULATORY_CARE_PROVIDER_SITE_OTHER): Payer: Medicare Other | Admitting: Family Medicine

## 2013-12-02 VITALS — BP 136/90 | HR 83 | Temp 98.2°F | Resp 18 | Ht 65.0 in | Wt 133.6 lb

## 2013-12-02 DIAGNOSIS — IMO0002 Reserved for concepts with insufficient information to code with codable children: Secondary | ICD-10-CM

## 2013-12-02 DIAGNOSIS — M6740 Ganglion, unspecified site: Secondary | ICD-10-CM

## 2013-12-02 DIAGNOSIS — S8010XA Contusion of unspecified lower leg, initial encounter: Secondary | ICD-10-CM

## 2013-12-02 NOTE — Progress Notes (Signed)
Subjective:    Patient ID: Ruth Walker, female    DOB: 1946-06-25, 68 y.o.   MRN: [id number] This chart was scribed for Merri Ray, MD by Anastasia Pall, ED Scribe. This patient was seen in room 03 and the patient's care was started at 4:03 PM.  Chief Complaint  Patient presents with  . Hand Pain    hand doesnt hurt but pt noticed a knot in her finger   HPI Ruth Walker is a 68 y.o. female 1st concern: Pt with h/o arthritis and osteoporosis, who presents with a knot over her left middle finger, first noticed this morning while stretching. She reports having other knots over her fingers and toes due to her arthritis, but states this new knot is in a strange place. She denies any pain with her knots. 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 She denies any weakness, numbness, and any other associated symptoms.   2nd concern: Pt reports contusion over her left lower shin, onset 2 months ago after getting hit by a skateboard. She reports swelling and redness initially, which has subsided since then. She denies any pain to the area. She was ambulatory at time of incident.  PCP Ruth Haber, MD  Patient Active Problem List   Diagnosis Date Noted  . External hemorrhoids 03/04/2012  . DVT (deep venous thrombosis)   . Uterine polyp   . Osteoporosis   . Vertebral compression fracture   . IBS (irritable bowel syndrome) 09/05/2011  . Constipation, slow transit 09/05/2011  . Anxiety disorder 09/05/2011  . Diverticulosis 11/04/2010  . Gallstones 11/04/2010  . TACHYCARDIA 06/17/2009  . ANAL FISSURE 05/26/2008  . OBSESSIVE-COMPULSIVE PERSONALITY DISORDER 02/28/2008  . HYPERTENSION 02/28/2008  . HEMORRHOIDS 02/28/2008  . ASTHMA 02/28/2008  . IRRITABLE BOWEL SYNDROME 02/28/2008  . CYSTITIS, CHRONIC 02/28/2008   Past Medical History  Diagnosis Date  . Hemorrhoids   . Irritable bowel syndrome (IBS)   . Asthma     Gets  allergy shots once a week   . Skin cancer     Leg  . Cholelithiasis   . Diverticulosis   . Anal fissure   . Anxiety states   . Uterine polyp   . Osteoporosis   . Vertebral compression fracture   . Environmental allergies   . Allergy   . Depression    Past Surgical History  Procedure Laterality Date  . Cesarean section    . Hemorrhoid surgery    . Back surgery    . Skin cancer excision      Leg   . Endometrial polypectomy    . Hysteroscopy    . Dilation and curettage of uterus    . Mouth surgery    . Fracture surgery      neck (vertebrae)   Allergies  Allergen Reactions  . Aloe   . Benadryl [Diphenhydramine Hcl] Other (See Comments)    Makes her feel "hyper"  . Cephalexin     REACTION: coarse rash  . Ciprofloxacin   . Mucinex [Guaifenesin Er] Other (See Comments)    Makes her feel worse  . Shellfish Allergy Hives  . Sudafed [Pseudoephedrine Hcl] Hives and Other (See Comments)    Keeps her awake  . Sulfonamide Derivatives    Prior to Admission medications   Medication Sig Start Date End Date Taking? Authorizing Provider  amoxicillin (AMOXIL) 875 MG tablet Take 1 tablet (875 mg total) by mouth 2 (two) times daily.  09/13/13  Yes Thao P Le, DO  aspirin 81 MG tablet Take 81 mg by mouth daily.     Yes Historical Provider, MD  Calcium Carbonate (CALCIUM 600 PO) Take 1 tablet by mouth 2 (two) times daily.   Yes Historical Provider, MD  cycloSPORINE (RESTASIS) 0.05 % ophthalmic emulsion 1 drop 2 (two) times daily.     Yes Historical Provider, MD  estradiol (ESTRACE VAGINAL) 0.1 MG/GM vaginal cream Place vaginally. As directed    Yes Historical Provider, MD  fluticasone (FLONASE) 50 MCG/ACT nasal spray Place 2 sprays into both nostrils daily. 09/13/13  Yes Thao P Le, DO  hydrochlorothiazide (HYDRODIURIL) 25 MG tablet Take 1 tablet (25 mg total) by mouth daily. 10/06/13  Yes Terrance Mass, MD  loratadine (CLARITIN) 10 MG tablet Take 10 mg by mouth as needed.    Yes Historical  Provider, MD  metoprolol tartrate (LOPRESSOR) 25 MG tablet Take 1 tablet (25 mg total) by mouth 2 (two) times daily. 04/01/13  Yes Ruth Haber, MD  polyethylene glycol [facility] / GLYCOLAX) packet Take 17 g by mouth daily. dissolve in 8 oz. Of water at bedtime    Yes Historical Provider, MD  PRESCRIPTION MEDICATION Allergy injection once a week.[facility] [employer]   Yes Historical Provider, MD  Probiotic Product (ALIGN) 4 MG CAPS Take 1 capsule by mouth daily. 07/05/11  Yes Sable Feil, MD  Vitamin D, Ergocalciferol, (DRISDOL) 50000 UNITS CAPS capsule Take 1 capsule (50,000 Units total) by mouth every 7 (seven) days. 04/28/13  Yes Terrance Mass, MD  Wheat Dextrin (BENEFIBER DRINK MIX PO) Take 10 mLs by mouth every morning. Take 2 teaspoons every morning     Yes Historical Provider, MD   Review of Systems  Constitutional: Negative for fever.  Musculoskeletal: Positive for joint swelling (Positive for new knot over left middle finger, multiple knots over other digits. ). Negative for arthralgias.       Positive for small bump over left lower shin that is non painful.   Skin: Negative for wound.  Neurological: Negative for weakness and numbness.      Objective:   Physical Exam  Nursing note and vitals reviewed. Constitutional: She is oriented to person, place, and time. She appears well-developed and well-nourished. No distress.  HENT:  Head: Normocephalic and atraumatic.  Eyes: EOM are normal.  Neck: Neck supple.  Cardiovascular: Normal rate and intact distal pulses.   Pulmonary/Chest: Effort normal. No respiratory distress.  Musculoskeletal: Normal range of motion. She exhibits edema. She exhibits no tenderness.  Diffuse nodularity around knuckles of both hands including DIP of 2nd 3rd and 5th finger on right and DIPS of 2nd and 5th finger on left as well as to IP of bilateral thumb. Full ROM at area of concern which is left 3rd finger. Full ROM with flexion and  extension of that PIP. Small nodular swelling appears to by cystic over distal aspect of dorsum of 3rd PIP non tender. No surrounding erythema no discharge. No pain with extensor testing.   Small approximately 5-7 mm soft tissue swelling over the distal tibia anteior aspect on left leg. No erythema fluctuance. Non tender.    Neurological: She is alert and oriented to person, place, and time.  Skin: Skin is warm and dry. No erythema.  Psychiatric: She has a normal mood and affect. Her behavior is normal.   BP 136/90  Pulse 83  Temp(Src) 98.2 F (36.8 C) (Oral)  Resp 18  Ht 5\' 5"  (  1.651 m)  Wt 133 lb 9.6 oz (60.601 kg)  BMI 22.23 kg/m2  SpO2 99%     Assessment & Plan:    ZEHAVA TURSKI is a 68 y.o. female Cyst in hand  - over L 3rd PIP. Multiple heberdon and bouchard nodes with underlying arthritis.  Suspected ganglion cyst.  Nontender, from and strength of tendons.  Options discussed by at present - will defer aspiration/removal unless bothering her more, enlarges, or redness/pain.  Contusion of leg  -prior contusion - residual scar/sts.  As remote and not hurting, and slowly improving - no intervention needed. rtc precautions.   No orders of the defined types were placed in this encounter.   Patient Instructions  If swelling or redness increases in finger, or more painful over next 2 weeks - return for recheck with myself or Dr. Joseph Art.   No treatment needed to bump on leg at this point.   Return to the clinic or go to the nearest emergency room if any of your symptoms worsen or new symptoms occur.     I personally performed the services described in this documentation, which was scribed in my presence. The recorded information has been reviewed and considered, and addended by me as needed.

## 2013-12-02 NOTE — Patient Instructions (Signed)
If swelling or redness increases in finger, or more painful over next 2 weeks - return for recheck with myself or Dr. Joseph Art.   No treatment needed to bump on leg at this point.   Return to the clinic or go to the nearest emergency room if any of your symptoms worsen or new symptoms occur.

## 2013-12-14 ENCOUNTER — Ambulatory Visit (INDEPENDENT_AMBULATORY_CARE_PROVIDER_SITE_OTHER): Payer: Medicare Other | Admitting: Family Medicine

## 2013-12-14 VITALS — BP 136/80 | HR 94 | Temp 98.1°F | Resp 16 | Wt 134.0 lb

## 2013-12-14 DIAGNOSIS — M79645 Pain in left finger(s): Secondary | ICD-10-CM

## 2013-12-14 DIAGNOSIS — M79609 Pain in unspecified limb: Secondary | ICD-10-CM

## 2013-12-14 NOTE — Progress Notes (Signed)
68 year old lady for followup of left middle finger PIP joint swelling/cyst. She's not benefit at all from nonsteroidal anti-inflammatory medicines.  Objective: Patient lacks about 20 of extension of the left middle finger PIP joint. There is a white multiple subcutaneous mass measuring about 3 x 5 millimeter.  Assessment: Possible tophaceous gout versus ganglion cyst at the left dorsal PCP joint of the middle finger.  Finger pain, left - Plan: Ambulatory referral to Hand Surgery  Signed, [name], MD

## 2013-12-17 ENCOUNTER — Telehealth: Payer: Self-pay

## 2013-12-17 NOTE — Telephone Encounter (Signed)
I don't have a blood thinner listed for the patient.  Which one is she taking?????

## 2013-12-17 NOTE — Telephone Encounter (Signed)
PT WANTED THE DR TO KNOW SHE IS TO HAVE HER SURGERY ON 7/13 AND IS ON BLOOD THINNER, NEED TO KNOW WHEN SHOULD SHE STOP TAKING HER MEDICINE AHEAD OF TIME PLEASE CALL PT AT [phone]

## 2013-12-18 NOTE — Telephone Encounter (Signed)
Tried calling patient but number was not in service.

## 2013-12-18 NOTE — Telephone Encounter (Signed)
Patient not able to be reached. I have no record of any blood thinners. Should she call again, we should find out what the medication is and who prescribes this for her.

## 2013-12-19 ENCOUNTER — Ambulatory Visit (INDEPENDENT_AMBULATORY_CARE_PROVIDER_SITE_OTHER): Payer: Medicare Other | Admitting: Family Medicine

## 2013-12-19 ENCOUNTER — Ambulatory Visit (INDEPENDENT_AMBULATORY_CARE_PROVIDER_SITE_OTHER): Payer: Medicare Other

## 2013-12-19 VITALS — BP 126/72 | HR 90 | Temp 98.1°F | Resp 18 | Ht 65.0 in | Wt 134.0 lb

## 2013-12-19 DIAGNOSIS — I10 Essential (primary) hypertension: Secondary | ICD-10-CM

## 2013-12-19 DIAGNOSIS — F429 Obsessive-compulsive disorder, unspecified: Secondary | ICD-10-CM

## 2013-12-19 DIAGNOSIS — R2232 Localized swelling, mass and lump, left upper limb: Secondary | ICD-10-CM

## 2013-12-19 DIAGNOSIS — R229 Localized swelling, mass and lump, unspecified: Secondary | ICD-10-CM

## 2013-12-19 MED ORDER — FLUOXETINE HCL 10 MG PO TABS: 10.0000 mg | ORAL_TABLET | Freq: Every day | ORAL | Status: DC

## 2013-12-19 NOTE — Patient Instructions (Signed)
Start taking the prozac once a day.  You should have your sodium checked with your next labs.  Take your CD to your upcoming ortho appointment.  I hope that the prozac will help with your OCD symptoms.  Let me know if you are getting worse or not better.

## 2013-12-19 NOTE — Progress Notes (Signed)
[facility] [address], [place] [address] [phone]  Date:  12/19/2013   Name:  Ruth Walker   DOB:  07-12-1945   MRN:  [id number]  PCP:  [name], MD    Chief Complaint: Follow-up and place on left breast   History of Present Illness:  Ruth Walker is a 68 y.o. very pleasant female patient who presents with the following:  She was here on 5/27 and again on 6/7 with a concern regarding her left middle finger.  She was referred to hand surgery.  She has a likely ganglion cyst vs tophaceous gout on the extensor left long finger.  She is worried that this might be gout. Her appt with [place] [facility] is 01/26/14.  Admits that she has been "obsessing" over the idea or gout and does not know if she needs to overhaul her diet or go on medication for gout.  Also noted a small spot on her left breast yesterday- according to her husband this is baseline, and she is UTD on her mammogram.  However she got worried and just wanted this looked at as well Mammogram 07/2013: normal Admits that her obsessions and worrying is getting worse, she would like to possibly take some medication for this at least for the short term.  Her problem is more anxiety and OCD sx than depression  Patient Active Problem List   Diagnosis Date Noted  . External hemorrhoids 03/04/2012  . DVT (deep venous thrombosis)   . Uterine polyp   . Osteoporosis   . Vertebral compression fracture   . IBS (irritable bowel syndrome) 09/05/2011  . Constipation, slow transit 09/05/2011  . Anxiety disorder 09/05/2011  . Diverticulosis 11/04/2010  . Gallstones 11/04/2010  . TACHYCARDIA 06/17/2009  . ANAL FISSURE 05/26/2008  . OBSESSIVE-COMPULSIVE PERSONALITY DISORDER 02/28/2008  . HYPERTENSION 02/28/2008  . HEMORRHOIDS 02/28/2008  . ASTHMA 02/28/2008  . IRRITABLE BOWEL SYNDROME 02/28/2008  . CYSTITIS, CHRONIC 02/28/2008    Past Medical History  Diagnosis Date  . Hemorrhoids   . Irritable bowel  syndrome (IBS)   . Asthma     Gets allergy shots once a week   . Skin cancer     Leg  . Cholelithiasis   . Diverticulosis   . Anal fissure   . Anxiety states   . Uterine polyp   . Osteoporosis   . Vertebral compression fracture   . Environmental allergies   . Allergy   . Depression     Past Surgical History  Procedure Laterality Date  . Cesarean section    . Hemorrhoid surgery    . Back surgery    . Skin cancer excision      Leg   . Endometrial polypectomy    . Hysteroscopy    . Dilation and curettage of uterus    . Mouth surgery    . Fracture surgery      neck (vertebrae)    History  Substance Use Topics  . Smoking status: Never Smoker   . Smokeless tobacco: Never Used  . Alcohol Use: No    Family History  Problem Relation Age of Onset  . Diabetes Mother   . Hypertension Mother   . Thyroid disease Mother   . Colon cancer Neg Hx   . Hypertension Father   . Heart disease Father   . Thyroid disease Sister     Allergies  Allergen Reactions  . Aloe   . Benadryl [Diphenhydramine Hcl] Other (See  Comments)    Makes her feel "hyper"  . Cephalexin     REACTION: coarse rash  . Ciprofloxacin   . Mucinex [Guaifenesin Er] Other (See Comments)    Makes her feel worse  . Shellfish Allergy Hives  . Sudafed [Pseudoephedrine Hcl] Hives and Other (See Comments)    Keeps her awake  . Sulfonamide Derivatives     Medication list has been reviewed and updated.  Current Outpatient Prescriptions on File Prior to Visit  Medication Sig Dispense Refill  . amoxicillin (AMOXIL) 875 MG tablet Take 1 tablet (875 mg total) by mouth 2 (two) times daily.  20 tablet  0  . aspirin 81 MG tablet Take 81 mg by mouth daily.        . Calcium Carbonate (CALCIUM 600 PO) Take 1 tablet by mouth 2 (two) times daily.      . cycloSPORINE (RESTASIS) 0.05 % ophthalmic emulsion 1 drop 2 (two) times daily.        Marland Kitchen estradiol (ESTRACE VAGINAL) 0.1 MG/GM vaginal cream Place vaginally. As directed        . fluticasone (FLONASE) 50 MCG/ACT nasal spray Place 2 sprays into both nostrils daily.  16 g  6  . hydrochlorothiazide (HYDRODIURIL) 25 MG tablet Take 1 tablet (25 mg total) by mouth daily.  30 tablet  3  . loratadine (CLARITIN) 10 MG tablet Take 10 mg by mouth as needed.       . metoprolol tartrate (LOPRESSOR) 25 MG tablet Take 1 tablet (25 mg total) by mouth 2 (two) times daily.  60 tablet  11  . polyethylene glycol (MIRALAX / GLYCOLAX) packet Take 17 g by mouth daily. dissolve in 8 oz. Of water at bedtime       . PRESCRIPTION MEDICATION Allergy injection once a week.[facility] [employer]      . Probiotic Product (ALIGN) 4 MG CAPS Take 1 capsule by mouth daily.  21 capsule  0  . Vitamin D, Ergocalciferol, (DRISDOL) 50000 UNITS CAPS capsule Take 1 capsule (50,000 Units total) by mouth every 7 (seven) days.  5 capsule  5  . Wheat Dextrin (BENEFIBER DRINK MIX PO) Take 10 mLs by mouth every morning. Take 2 teaspoons every morning        . [DISCONTINUED] Linaclotide (LINZESS) 145 MCG CAPS Take 1 capsule by mouth daily.  8 capsule  0   No current facility-administered medications on file prior to visit.    Review of Systems:  As per HPI- otherwise negative.   Physical Examination: Filed Vitals:   12/19/13 1513  BP: 126/72  Pulse: 90  Temp: 98.1 F (36.7 C)  Resp: 18   Filed Vitals:   12/19/13 1513  Height: 5\' 5"  (1.651 m)  Weight: 134 lb (60.782 kg)   Body mass index is 22.3 kg/(m^2). Ideal Body Weight: Weight in (lb) to have BMI = 25: 149.9  GEN: WDWN, NAD, Non-toxic, A & O x 3, looks well, talkative HEENT: Atraumatic, Normocephalic. Neck supple. No masses, No LAD. Ears and Nose: No external deformity. CV: RRR, No M/G/R. No JVD. No thrill. No extra heart sounds. PULM: CTA B, no wheezes, crackles, rhonchi. No retractions. No resp. distress. No accessory muscle use. ABD: S, NT, ND, +BS. No rebound. No HSM. EXTR: No c/c/e NEURO Normal gait.  PSYCH: Normally  interactive. Conversant. Not depressed or anxious appearing.  Calm demeanor.  Breast exam: normal exam.  She notes a normal tiny gland of montgomery on her left areola, the area  in question.  Otherwise normal bilateral breast exam Left hand: she has a soft, nodular area over the extensor surface of the left long finger.  Evidence of significant degenerative change in her hand joints.  However no tenderness, heat or other swelling.    [facility] reading (PRIMARY) by  Dr. Lorelei Pont. Left hand: significant degenerative change, nothing acute LEFT HAND - 2 VIEW  COMPARISON: None.  FINDINGS: Extensive degenerative changes are present throughout the hand. Most notably within the DIP joints of the second and third digits in the PIP joints of the third digit, ring FLAIR, and fifth digit.  No acute osseous abnormalities present. Advanced degenerative changes are present at the first [facility] joint.  IMPRESSION: 1. Degenerative changes throughout the hand. 2. No acute abnormality.  Assessment and Plan: Nodule of finger of left hand - Plan: DG Hand 2 View Left  OCD (obsessive compulsive disorder) - Plan: FLUoxetine (PROZAC) 10 MG tablet  Reassured that the finding on her breast is normal.  Continue mammograms She has an appt to see ortho coming up.  Given copy of X-ray.  Reassured that at this time she does not need any particular tx for gout Start prozac at 10mg  for OCD.  She will keep an eye on her BP and come in for a CMP soon to monitor her sodium- declined this today  Signed Lamar Blinks, MD

## 2013-12-29 ENCOUNTER — Ambulatory Visit: Payer: Medicare Other

## 2013-12-29 ENCOUNTER — Ambulatory Visit (INDEPENDENT_AMBULATORY_CARE_PROVIDER_SITE_OTHER): Payer: Medicare Other | Admitting: Family Medicine

## 2013-12-29 VITALS — BP 130/80 | HR 76 | Temp 97.8°F | Resp 16 | Wt 133.0 lb

## 2013-12-29 DIAGNOSIS — M25569 Pain in unspecified knee: Secondary | ICD-10-CM

## 2013-12-29 DIAGNOSIS — M7139 Other bursal cyst, multiple sites: Secondary | ICD-10-CM

## 2013-12-29 DIAGNOSIS — S60222A Contusion of left hand, initial encounter: Secondary | ICD-10-CM

## 2013-12-29 DIAGNOSIS — M6740 Ganglion, unspecified site: Secondary | ICD-10-CM

## 2013-12-29 DIAGNOSIS — M6749 Ganglion, multiple sites: Secondary | ICD-10-CM

## 2013-12-29 DIAGNOSIS — S60229A Contusion of unspecified hand, initial encounter: Secondary | ICD-10-CM

## 2013-12-29 DIAGNOSIS — M6789 Other specified disorders of synovium and tendon, multiple sites: Secondary | ICD-10-CM

## 2013-12-29 DIAGNOSIS — M7130 Other bursal cyst, unspecified site: Secondary | ICD-10-CM

## 2013-12-29 NOTE — Progress Notes (Addendum)
This chart was scribed for [name], MD by Vernell Barrier, Medical Scribe. The patient was seen in room 10. This patient's care was started at 8:33 PM   Patient ID: Ruth Walker MRN: [id number], DOB: 1946-04-26, 68 y.o. Date of Encounter: 12/29/2013, 8:33 PM  Primary Physician: [name], MD  Chief Complaint: left hand bruise  HPI: 68 y.o. year old female with history below presents with a bruise at the base of the 1st left metatarsal. States she was rough playing with her grandson and he grabbed her left hand squeezing it in the midst of the play. Area is tender to touch. Reports the nodule at her third left finger has gone down severely.   Also reports bilateral leg pain because she stopped doing her exercise. Reports she has restarted exercise regimen and leg pain is improving.    Past Medical History  Diagnosis Date   Hemorrhoids    Irritable bowel syndrome (IBS)    Asthma     Gets allergy shots once a week    Skin cancer     Leg   Cholelithiasis    Diverticulosis    Anal fissure    Anxiety states    Uterine polyp    Osteoporosis    Vertebral compression fracture    Environmental allergies    Allergy    Depression      Home Meds: Prior to Admission medications   Medication Sig Start Date End Date Taking? Authorizing Provider  aspirin 81 MG tablet Take 81 mg by mouth daily.     Yes Historical Provider, MD  Calcium Carbonate (CALCIUM 600 PO) Take 1 tablet by mouth 2 (two) times daily.   Yes Historical Provider, MD  cycloSPORINE (RESTASIS) 0.05 % ophthalmic emulsion 1 drop 2 (two) times daily.     Yes Historical Provider, MD  estradiol (ESTRACE VAGINAL) 0.1 MG/GM vaginal cream Place vaginally. As directed    Yes Historical Provider, MD  FLUoxetine (PROZAC) 10 MG tablet Take 1 tablet (10 mg total) by mouth daily. 12/19/13  Yes Gay Filler Copland, MD  fluticasone (FLONASE) 50 MCG/ACT nasal spray Place 2 sprays into both nostrils daily. 09/13/13  Yes  Thao P Le, DO  hydrochlorothiazide (HYDRODIURIL) 25 MG tablet Take 1 tablet (25 mg total) by mouth daily. 10/06/13  Yes Terrance Mass, MD  loratadine (CLARITIN) 10 MG tablet Take 10 mg by mouth as needed.    Yes Historical Provider, MD  metoprolol tartrate (LOPRESSOR) 25 MG tablet Take 1 tablet (25 mg total) by mouth 2 (two) times daily. 04/01/13  Yes [name], MD  polyethylene glycol [facility] / GLYCOLAX) packet Take 17 g by mouth daily. dissolve in 8 oz. Of water at bedtime    Yes Historical Provider, MD  PRESCRIPTION MEDICATION Allergy injection once a week.[facility]land Kitchen[facility] [employer]   Yes Historical Provider, MD  Probiotic Product (ALIGN) 4 MG CAPS Take 1 capsule by mouth daily. 07/05/11  Yes Sable Feil, MD  Vitamin D, Ergocalciferol, (DRISDOL) 50000 UNITS CAPS capsule Take 1 capsule (50,000 Units total) by mouth every 7 (seven) days. 04/28/13  Yes Terrance Mass, MD  Wheat Dextrin (BENEFIBER DRINK MIX PO) Take 10 mLs by mouth every morning. Take 2 teaspoons every morning     Yes Historical Provider, MD    Allergies:  Allergies  Allergen Reactions   Aloe    Benadryl [Diphenhydramine Hcl] Other (See Comments)    Makes her feel "hyper"   Cephalexin     REACTION:  coarse rash   Ciprofloxacin    Mucinex [Guaifenesin Er] Other (See Comments)    Makes her feel worse   Shellfish Allergy Hives   Sudafed [Pseudoephedrine Hcl] Hives and Other (See Comments)    Keeps her awake   Sulfonamide Derivatives     History   Social History   Marital Status: Married    Spouse Name: N/A    Number of Children: 2   Years of Education: N/A   Occupational History   Retired    Social History Main Topics   Smoking status: Never Smoker    Smokeless tobacco: Never Used   Alcohol Use: No   Drug Use: No   Sexual Activity: Yes    Museum/gallery curator: Post-menopausal     Comment: number of sex partners in the last 18 months  1   Other Topics Concern    Not on file   Social History Narrative   Patient gets regular exercise     Review of Systems: Constitutional: negative for chills, fever, night sweats, weight changes, or fatigue  HEENT: negative for vision changes, hearing loss, congestion, rhinorrhea, ST, epistaxis, or sinus pressure Cardiovascular: negative for chest pain or palpitations Respiratory: negative for hemoptysis, wheezing, shortness of breath, or cough Abdominal: negative for abdominal pain, nausea, vomiting, diarrhea, or constipation Dermatological: negative for rash Neurologic: negative for headache, dizziness, or syncope All other systems reviewed and are otherwise negative with the exception to those above and in the HPI.   Physical Exam: Blood pressure 130/80, pulse 76, temperature 97.8 F (36.6 C), temperature source Oral, resp. rate 16, weight 133 lb (60.328 kg), SpO2 100.00%., Body mass index is 22.13 kg/(m^2). General: Well developed, well nourished, in no acute distress. Head: Normocephalic, atraumatic, eyes without discharge, sclera non-icteric, nares are without discharge. Bilateral auditory canals clear, TM's are without perforation, pearly grey and translucent with reflective cone of light bilaterally. Oral cavity moist, posterior pharynx without exudate, erythema, peritonsillar abscess, or post nasal drip.  Neck: Supple. No thyromegaly. Full ROM. No lymphadenopathy. Lungs: Clear bilaterally to auscultation without wheezes, rales, or rhonchi. Breathing is unlabored. Heart: RRR with S1 S2. No murmurs, rubs, or gallops appreciated. Abdomen: Soft, non-tender, non-distended with normoactive bowel sounds. No hepatomegaly. No rebound/guarding. No obvious abdominal masses. Msk:  Strength and tone normal for age. Extremities/Skin: Warm and dry. No clubbing or cyanosis. No edema. No rashes or suspicious lesions. Ecchymotic area at the base of the first left metatarsal. Nodule on left third metatarsal decreased in size.   Neuro: Alert and oriented X 3. Moves all extremities spontaneously. Gait is normal. CNII-XII grossly in tact. Psych:  Responds to questions appropriately with a normal affect.  [facility] reading (PRIMARY) by  Dr. Joseph Art:  Arthritis left hand at mcp joint of 1st finger.     ASSESSMENT AND PLAN:  68 y.o. year old female with Hand contusion, left, initial encounter - Plan: DG Hand 2 View Left  Pain in joint, lower leg, unspecified laterality  Ganglion and cyst of synovium, tendon and bursa  Ace wrap   Signed, [name], MD 12/29/2013 8:25 PM

## 2014-01-31 ENCOUNTER — Other Ambulatory Visit: Payer: Self-pay | Admitting: Gynecology

## 2014-02-10 ENCOUNTER — Telehealth: Payer: Self-pay | Admitting: Physician Assistant

## 2014-02-10 NOTE — Telephone Encounter (Signed)
Line busy. Will try again later.

## 2014-02-10 NOTE — Telephone Encounter (Signed)
Spoke with patient and she started having problems with hemorrhoids and rectal bleeding yesterday. She is doing hemorrhoid care(sitz baths, patting self clean) Hx fissures. Scheduled with Tye Savoy, NP 02/13/14 at 2:30 PM.

## 2014-02-12 ENCOUNTER — Telehealth: Payer: Self-pay | Admitting: Physician Assistant

## 2014-02-12 NOTE — Telephone Encounter (Signed)
Spoke with patient and she would like to see Nicoletta Ba, PA instead of her OV on Friday. States the bleeding when she wipes has stopped. Moved OV to 02/18/14 at 3:00 PM with Nicoletta Ba, PA

## 2014-02-13 ENCOUNTER — Ambulatory Visit: Payer: Medicare Other | Admitting: Nurse Practitioner

## 2014-02-18 ENCOUNTER — Ambulatory Visit: Payer: Medicare Other | Admitting: Physician Assistant

## 2014-03-22 ENCOUNTER — Ambulatory Visit (INDEPENDENT_AMBULATORY_CARE_PROVIDER_SITE_OTHER): Payer: Medicare Other | Admitting: Emergency Medicine

## 2014-03-22 VITALS — BP 140/80 | HR 84 | Temp 97.8°F | Resp 16 | Ht 65.0 in | Wt 127.8 lb

## 2014-03-22 DIAGNOSIS — L0100 Impetigo, unspecified: Secondary | ICD-10-CM

## 2014-03-22 MED ORDER — DOXYCYCLINE HYCLATE 100 MG PO CAPS: 100.0000 mg | ORAL_CAPSULE | Freq: Two times a day (BID) | ORAL | Status: DC

## 2014-03-22 NOTE — Progress Notes (Signed)
[facility] [address], [place] [address] [phone]  Date:  03/22/2014   Name:  Ruth Walker   DOB:  03/08/1946   MRN:  [id number]  PCP:  [name], MD    Chief Complaint: Rash   History of Present Illness:  Ruth Walker is a 68 y.o. very pleasant female patient who presents with the following:  Patient has a problem with whiskers and has been dry shaving her chin. Now has a rash on her chin.  No drainage.  No fever or chills. No improvement with over the counter medications or other home remedies. Denies other complaint or health concern today.   Patient Active Problem List   Diagnosis Date Noted  . External hemorrhoids 03/04/2012  . DVT (deep venous thrombosis)   . Uterine polyp   . Osteoporosis   . Vertebral compression fracture   . IBS (irritable bowel syndrome) 09/05/2011  . Constipation, slow transit 09/05/2011  . Anxiety disorder 09/05/2011  . Diverticulosis 11/04/2010  . Gallstones 11/04/2010  . TACHYCARDIA 06/17/2009  . ANAL FISSURE 05/26/2008  . OBSESSIVE-COMPULSIVE PERSONALITY DISORDER 02/28/2008  . HYPERTENSION 02/28/2008  . HEMORRHOIDS 02/28/2008  . ASTHMA 02/28/2008  . IRRITABLE BOWEL SYNDROME 02/28/2008  . CYSTITIS, CHRONIC 02/28/2008    Past Medical History  Diagnosis Date  . Hemorrhoids   . Irritable bowel syndrome (IBS)   . Asthma     Gets allergy shots once a week   . Skin cancer     Leg  . Cholelithiasis   . Diverticulosis   . Anal fissure   . Anxiety states   . Uterine polyp   . Osteoporosis   . Vertebral compression fracture   . Environmental allergies   . Allergy   . Depression     Past Surgical History  Procedure Laterality Date  . Cesarean section    . Hemorrhoid surgery    . Back surgery    . Skin cancer excision      Leg   . Endometrial polypectomy    . Hysteroscopy    . Dilation and curettage of uterus    . Mouth surgery    . Fracture surgery      neck (vertebrae)     History  Substance Use Topics  . Smoking status: Never Smoker   . Smokeless tobacco: Never Used  . Alcohol Use: No    Family History  Problem Relation Age of Onset  . Diabetes Mother   . Hypertension Mother   . Thyroid disease Mother   . Colon cancer Neg Hx   . Hypertension Father   . Heart disease Father   . Thyroid disease Sister     Allergies  Allergen Reactions  . Aloe   . Benadryl [Diphenhydramine Hcl] Other (See Comments)    Makes her feel "hyper"  . Cephalexin     REACTION: coarse rash  . Ciprofloxacin   . Mucinex [Guaifenesin Er] Other (See Comments)    Makes her feel worse  . Shellfish Allergy Hives  . Sudafed [Pseudoephedrine Hcl] Hives and Other (See Comments)    Keeps her awake  . Sulfonamide Derivatives     Medication list has been reviewed and updated.  Current Outpatient Prescriptions on File Prior to Visit  Medication Sig Dispense Refill  . aspirin 81 MG tablet Take 81 mg by mouth daily.        . Calcium Carbonate (CALCIUM 600 PO) Take 1 tablet by mouth 2 (two) times  daily.      . cycloSPORINE (RESTASIS) 0.05 % ophthalmic emulsion 1 drop 2 (two) times daily.        Marland Kitchen estradiol (ESTRACE VAGINAL) 0.1 MG/GM vaginal cream Place vaginally. As directed       . FLUoxetine (PROZAC) 10 MG tablet Take 1 tablet (10 mg total) by mouth daily.  30 tablet  3  . fluticasone (FLONASE) 50 MCG/ACT nasal spray Place 2 sprays into both nostrils daily.  16 g  6  . hydrochlorothiazide (HYDRODIURIL) 25 MG tablet take 1 tablet by mouth once daily  30 tablet  3  . loratadine (CLARITIN) 10 MG tablet Take 10 mg by mouth as needed.       . metoprolol tartrate (LOPRESSOR) 25 MG tablet Take 1 tablet (25 mg total) by mouth 2 (two) times daily.  60 tablet  11  . polyethylene glycol (MIRALAX / GLYCOLAX) packet Take 17 g by mouth daily. dissolve in 8 oz. Of water at bedtime       . PRESCRIPTION MEDICATION Allergy injection once a week.[facility] [employer]      .  Probiotic Product (ALIGN) 4 MG CAPS Take 1 capsule by mouth daily.  21 capsule  0  . Vitamin D, Ergocalciferol, (DRISDOL) 50000 UNITS CAPS capsule Take 1 capsule (50,000 Units total) by mouth every 7 (seven) days.  5 capsule  5  . Wheat Dextrin (BENEFIBER DRINK MIX PO) Take 10 mLs by mouth every morning. Take 2 teaspoons every morning        . [DISCONTINUED] Linaclotide (LINZESS) 145 MCG CAPS Take 1 capsule by mouth daily.  8 capsule  0   No current facility-administered medications on file prior to visit.    Review of Systems:  As per HPI, otherwise negative.    Physical Examination: Filed Vitals:   03/22/14 1154  BP: 140/80  Pulse: 84  Temp: 97.8 F (36.6 C)  Resp: 16   Filed Vitals:   03/22/14 1154  Height: 5\' 5"  (1.651 m)  Weight: 127 lb 12.8 oz (57.97 kg)   Body mass index is 21.27 kg/(m^2). Ideal Body Weight: Weight in (lb) to have BMI = 25: 149.9   GEN: WDWN, NAD, Non-toxic, Alert & Oriented x 3 HEENT: Atraumatic, Normocephalic.  Ears and Nose: No external deformity. EXTR: No clubbing/cyanosis/edema NEURO: Normal gait.  PSYCH: Normally interactive. Conversant. Not depressed or anxious appearing.  Calm demeanor.  SKIN:  Impetigo chin   Assessment and Plan: Impetigo Doxycycline  Signed,  Ellison Carwin, MD

## 2014-03-29 ENCOUNTER — Ambulatory Visit (INDEPENDENT_AMBULATORY_CARE_PROVIDER_SITE_OTHER): Payer: Medicare Other | Admitting: Family Medicine

## 2014-03-29 VITALS — BP 134/78 | HR 77 | Temp 97.5°F | Resp 18 | Ht 65.0 in | Wt 127.0 lb

## 2014-03-29 DIAGNOSIS — L309 Dermatitis, unspecified: Secondary | ICD-10-CM

## 2014-03-29 DIAGNOSIS — L259 Unspecified contact dermatitis, unspecified cause: Secondary | ICD-10-CM

## 2014-03-29 DIAGNOSIS — R21 Rash and other nonspecific skin eruption: Secondary | ICD-10-CM

## 2014-03-29 MED ORDER — CLOTRIMAZOLE-BETAMETHASONE 1-0.05 % EX CREA: 1.0000 "application " | TOPICAL_CREAM | Freq: Two times a day (BID) | CUTANEOUS | Status: DC

## 2014-03-29 MED ORDER — FLUOCINONIDE-E 0.05 % EX CREA: 1.0000 "application " | TOPICAL_CREAM | Freq: Two times a day (BID) | CUTANEOUS | Status: DC

## 2014-03-29 NOTE — Progress Notes (Signed)
This chart was scribed for Dr. [name], MD by Erling Conte, Medical Scribe. This patient was seen in Room 12 and the patient's care was started at 1:30 PM   Patient ID: Ruth Walker MRN: [id number], DOB: 1945/12/18, 68 y.o. Date of Encounter: 03/29/2014, 1:30 PM  Primary Physician: [name], MD  Chief Complaint:  Chief Complaint  Patient presents with  . Rash    on face      HPI: 68 y.o. year old female with history below presents with a red, itchy, gradually spreading rash on her face for two weeks. The rash is localized around her mouth and jaw line. She states she had been using a razor on her face. She states that menopause had caused her to have a few random hairs on her face and so she used a dry razor to get rid of her stubble. She states that ever since she has had a rash, redness and intermittent burning of her face. She has also been using Jergen's and Noxzema shaving cream. She came to [facility] 1 week ago and saw Dr. Ouida Sills and was given doxycycline and it is not helping. She denies any drainage from the rash, mouth sores, cough, shortness of breath, or trouble swallowing.   Past Medical History  Diagnosis Date  . Hemorrhoids   . Irritable bowel syndrome (IBS)   . Asthma     Gets allergy shots once a week   . Skin cancer     Leg  . Cholelithiasis   . Diverticulosis   . Anal fissure   . Anxiety states   . Uterine polyp   . Osteoporosis   . Vertebral compression fracture   . Environmental allergies   . Allergy   . Depression      Home Meds: Prior to Admission medications   Medication Sig Start Date End Date Taking? Authorizing Provider  aspirin 81 MG tablet Take 81 mg by mouth daily.     Yes Historical Provider, MD  Calcium Carbonate (CALCIUM 600 PO) Take 1 tablet by mouth 2 (two) times daily.   Yes Historical Provider, MD  cycloSPORINE (RESTASIS) 0.05 % ophthalmic emulsion 1 drop 2 (two) times daily.     Yes Historical Provider, MD    doxycycline (VIBRAMYCIN) 100 MG capsule Take 1 capsule (100 mg total) by mouth 2 (two) times daily. 03/22/14  Yes Roselee Culver, MD  estradiol (ESTRACE VAGINAL) 0.1 MG/GM vaginal cream Place vaginally. As directed    Yes Historical Provider, MD  FLUoxetine (PROZAC) 10 MG tablet Take 1 tablet (10 mg total) by mouth daily. 12/19/13  Yes Gay Filler Copland, MD  fluticasone (FLONASE) 50 MCG/ACT nasal spray Place 2 sprays into both nostrils daily. 09/13/13  Yes Thao P Le, DO  hydrochlorothiazide (HYDRODIURIL) 25 MG tablet take 1 tablet by mouth once daily   Yes Terrance Mass, MD  loratadine (CLARITIN) 10 MG tablet Take 10 mg by mouth as needed.    Yes Historical Provider, MD  metoprolol tartrate (LOPRESSOR) 25 MG tablet Take 1 tablet (25 mg total) by mouth 2 (two) times daily. 04/01/13  Yes [name], MD  polyethylene glycol [facility] / GLYCOLAX) packet Take 17 g by mouth daily. dissolve in 8 oz. Of water at bedtime    Yes Historical Provider, MD  PRESCRIPTION MEDICATION Allergy injection once a week.[facility] [employer]   Yes Historical Provider, MD  Probiotic Product (ALIGN) 4 MG CAPS Take 1 capsule by mouth daily. 07/05/11  Yes Shanon Brow  Consuello Masse, MD  Vitamin D, Ergocalciferol, (DRISDOL) 50000 UNITS CAPS capsule Take 1 capsule (50,000 Units total) by mouth every 7 (seven) days. 04/28/13  Yes Terrance Mass, MD  Wheat Dextrin (BENEFIBER DRINK MIX PO) Take 10 mLs by mouth every morning. Take 2 teaspoons every morning     Yes Historical Provider, MD    Allergies:  Allergies  Allergen Reactions  . Aloe   . Benadryl [Diphenhydramine Hcl] Other (See Comments)    Makes her feel "hyper"  . Cephalexin     REACTION: coarse rash  . Ciprofloxacin   . Mucinex [Guaifenesin Er] Other (See Comments)    Makes her feel worse  . Shellfish Allergy Hives  . Sudafed [Pseudoephedrine Hcl] Hives and Other (See Comments)    Keeps her awake  . Sulfonamide Derivatives     History   Social  History  . Marital Status: Married    Spouse Name: N/A    Number of Children: 2  . Years of Education: N/A   Occupational History  . Retired    Social History Main Topics  . Smoking status: Never Smoker   . Smokeless tobacco: Never Used  . Alcohol Use: No  . Drug Use: No  . Sexual Activity: Yes    Birth Control/ Protection: Post-menopausal     Comment: number of sex partners in the last 16 months  1   Other Topics Concern  . Not on file   Social History Narrative   Patient gets regular exercise     Review of Systems: Constitutional: negative for chills, fever, night sweats, weight changes, or fatigue  HEENT: negative for vision changes, hearing loss, congestion, rhinorrhea, ST, epistaxis, trouble swallowing, or sinus pressure Cardiovascular: negative for chest pain or palpitations Respiratory: negative for hemoptysis, wheezing, shortness of breath, or cough Abdominal: negative for abdominal pain, nausea, vomiting, diarrhea, or constipation Dermatological: Positive for rash and redness of the face. Neurologic: negative for headache, dizziness, or syncope All other systems reviewed and are otherwise negative with the exception to those above and in the HPI.   Physical Exam: Blood pressure 134/78, pulse 77, temperature 97.5 F (36.4 C), temperature source Oral, resp. rate 18, height 5\' 5"  (1.651 m), weight 127 lb (57.607 kg), SpO2 100.00%., Body mass index is 21.13 kg/(m^2). General: Well developed, well nourished, in no acute distress. Head: Normocephalic, atraumatic, eyes without discharge, sclera non-icteric, nares are without discharge. Bilateral auditory canals clear, TM's are without perforation, pearly grey and translucent with reflective cone of light bilaterally. Oral cavity moist, posterior pharynx without exudate, erythema, peritonsillar abscess, or post nasal drip.  Neck: Supple. No thyromegaly. Full ROM. No lymphadenopathy. Lungs: Clear bilaterally to auscultation  without wheezes, rales, or rhonchi. Breathing is unlabored. Heart: RRR with S1 S2. No murmurs, rubs, or gallops appreciated. Abdomen: Soft, non-tender, non-distended with normoactive bowel sounds. No hepatomegaly. No rebound/guarding. No obvious abdominal masses. Msk:  Strength and tone normal for age. Extremities/Skin: Warm and dry. No clubbing or cyanosis. No edema. Patient has eczema on her heels which are cracking. She has a salmon colored rough rash on her chin and upper cervical neck. Neuro: Alert and oriented X 3. Moves all extremities spontaneously. Gait is normal. CNII-XII grossly in tact. Psych:  Responds to questions appropriately with a normal affect.   ASSESSMENT AND PLAN:  68 y.o. year old female with   Eczema - Plan: fluocinonide-emollient (LIDEX-E) 0.05 % cream  Facial rash - Plan: clotrimazole-betamethasone (LOTRISONE) cream    Signed, Synetta Shadow  Shaheer Bonfield, MD 03/29/2014 1:30 PM

## 2014-04-02 ENCOUNTER — Other Ambulatory Visit: Payer: Self-pay | Admitting: Family Medicine

## 2014-04-11 ENCOUNTER — Telehealth: Payer: Self-pay | Admitting: *Deleted

## 2014-04-11 ENCOUNTER — Other Ambulatory Visit: Payer: Self-pay | Admitting: Gynecology

## 2014-04-11 ENCOUNTER — Other Ambulatory Visit: Payer: Self-pay | Admitting: *Deleted

## 2014-04-11 ENCOUNTER — Telehealth: Payer: Self-pay

## 2014-04-11 MED ORDER — TRIAMCINOLONE ACETONIDE 0.1 % EX CREA: 1.0000 "application " | TOPICAL_CREAM | Freq: Three times a day (TID) | CUTANEOUS | Status: DC

## 2014-04-11 NOTE — Telephone Encounter (Signed)
Pt called to ask questions about how to use the lotrisone cream. I did not finish the message because the clinical TL was able to take the phone call and answer the pt's questions.

## 2014-04-11 NOTE — Telephone Encounter (Signed)
Pt called and stated that one side of her face is still only a little red and wanted to know if it was ok to keep using cream.  Advised per Dr. Carlean Jews he will change to triamcinolone and she will use 3 times a day for 2 weeks and if it is no better then she should come in.    Please send in rx for triamcinolone to [facility] [place]

## 2014-04-11 NOTE — Telephone Encounter (Deleted)
Pt

## 2014-04-13 ENCOUNTER — Telehealth: Payer: Self-pay | Admitting: Physician Assistant

## 2014-04-13 NOTE — Telephone Encounter (Signed)
Spoke with patient and she states she strained to have a bowel movement and for 2 days, was having rectal pain. Today, she had bright, red blood with stool and in toilet water. She is asking for OV to see Nicoletta Ba, PA . Scheduled tomorrow at 3:00 PM.

## 2014-04-14 ENCOUNTER — Ambulatory Visit (INDEPENDENT_AMBULATORY_CARE_PROVIDER_SITE_OTHER): Payer: Medicare Other | Admitting: Physician Assistant

## 2014-04-14 ENCOUNTER — Encounter: Payer: Self-pay | Admitting: Physician Assistant

## 2014-04-14 VITALS — BP 128/78 | HR 78 | Ht 67.0 in | Wt 128.8 lb

## 2014-04-14 DIAGNOSIS — K602 Anal fissure, unspecified: Secondary | ICD-10-CM

## 2014-04-14 NOTE — Progress Notes (Signed)
Reviewed and agree with management plan.  Fordyce Lepak T. Juel Bellerose, MD FACG 

## 2014-04-14 NOTE — Progress Notes (Signed)
Subjective:    Patient ID: Ruth Walker, female    DOB: 04-Oct-1945, 68 y.o.   MRN: [id number]  HPI Zaryia is a very nice 68 year old white female former patient of Dr. Buel Ream, now established with Dr. Fuller Plan. She has history of chronic anxiety, OCD, diverticulosis, and IBS constipation predominant. She also has previously documented gallstones from imaging in 2012. Patient was last seen by myself in March of 2015 at that time with complaints of rectal bleeding. She had an asymptomatic external hemorrhoid at that time. She has been on a regimen of Align Benefiber and MiraLax and says she has been doing well and having regular bowel movements. Yesterday she had some straining for a bowel movement and saw a small amounts of bright red blood in the commode after the bowel movement. She also says she had rectal soreness thereafter. Pain has not been excruciating and she says is already feeling a bit better today and she has not noticed any blood today .She admits that she "freaks out" when she sees blood . Last colonoscopy was done in March of 2010 noted to have mild sigmoid diverticulosis otherwise negative exam.   Review of Systems  Constitutional: Negative.   HENT: Negative.   Eyes: Negative.   Respiratory: Negative.   Cardiovascular: Negative.   Gastrointestinal: Positive for anal bleeding and rectal pain.  Endocrine: Negative.   Genitourinary: Negative.   Musculoskeletal: Negative.   Skin: Negative.   Allergic/Immunologic: Negative.   Neurological: Negative.   Hematological: Negative.   Psychiatric/Behavioral: Negative.    Outpatient Prescriptions Prior to Visit  Medication Sig Dispense Refill  . aspirin 81 MG tablet Take 81 mg by mouth daily.        . Calcium Carbonate (CALCIUM 600 PO) Take 1 tablet by mouth 2 (two) times daily.      . cycloSPORINE (RESTASIS) 0.05 % ophthalmic emulsion 1 drop 2 (two) times daily.        Marland Kitchen estradiol (ESTRACE VAGINAL) 0.1 MG/GM vaginal cream  Place vaginally. As directed       . fluocinonide-emollient (LIDEX-E) 0.05 % cream Apply 1 application topically 2 (two) times daily. For heel  30 g  3  . fluticasone (FLONASE) 50 MCG/ACT nasal spray Place 2 sprays into both nostrils daily.  16 g  6  . hydrochlorothiazide (HYDRODIURIL) 25 MG tablet take 1 tablet by mouth once daily  30 tablet  3  . metoprolol tartrate (LOPRESSOR) 25 MG tablet take 1 tablet by mouth twice a day  60 tablet  11  . polyethylene glycol (MIRALAX / GLYCOLAX) packet Take 17 g by mouth daily. dissolve in 8 oz. Of water at bedtime       . PRESCRIPTION MEDICATION Allergy injection once a week.[facility] [employer]      . Probiotic Product (ALIGN) 4 MG CAPS Take 1 capsule by mouth daily.  21 capsule  0  . triamcinolone cream (KENALOG) 0.1 % Apply 1 application topically 3 (three) times daily.  30 g  0  . Vitamin D, Ergocalciferol, (DRISDOL) 50000 UNITS CAPS capsule take 1 capsule by mouth EVERY 7 DAYS  5 capsule  0  . Wheat Dextrin (BENEFIBER DRINK MIX PO) Take 10 mLs by mouth every morning. Take 2 teaspoons every morning        . clotrimazole-betamethasone (LOTRISONE) cream Apply 1 application topically 2 (two) times daily. For face  30 g  0  . doxycycline (VIBRAMYCIN) 100 MG capsule Take 1 capsule (100 mg total) by  mouth 2 (two) times daily.  20 capsule  0  . FLUoxetine (PROZAC) 10 MG tablet Take 1 tablet (10 mg total) by mouth daily.  30 tablet  3  . loratadine (CLARITIN) 10 MG tablet Take 10 mg by mouth as needed.        No facility-administered medications prior to visit.   Allergies  Allergen Reactions  . Aloe   . Benadryl [Diphenhydramine Hcl] Other (See Comments)    Makes her feel "hyper"  . Cephalexin     REACTION: coarse rash  . Ciprofloxacin   . Mucinex [Guaifenesin Er] Other (See Comments)    Makes her feel worse  . Shellfish Allergy Hives  . Sudafed [Pseudoephedrine Hcl] Hives and Other (See Comments)    Keeps her awake  . Sulfonamide  Derivatives    Patient Active Problem List   Diagnosis Date Noted  . External hemorrhoids 03/04/2012  . DVT (deep venous thrombosis)   . Uterine polyp   . Osteoporosis   . Vertebral compression fracture   . IBS (irritable bowel syndrome) 09/05/2011  . Constipation, slow transit 09/05/2011  . Anxiety disorder 09/05/2011  . Diverticulosis 11/04/2010  . Gallstones 11/04/2010  . ANAL FISSURE 05/26/2008  . OBSESSIVE-COMPULSIVE PERSONALITY DISORDER 02/28/2008  . HYPERTENSION 02/28/2008  . HEMORRHOIDS 02/28/2008  . ASTHMA 02/28/2008  . CYSTITIS, CHRONIC 02/28/2008   History  Substance Use Topics  . Smoking status: Never Smoker   . Smokeless tobacco: Never Used  . Alcohol Use: No       Objective:   Physical Exam  well-developed older white female in no acute distress, pleasant blood pressure 128/78 pulse 78 height 5 foot 7 weight 128. HEENT; nontraumatic normocephalic EOMI PERRLA sclera anicteric, Supple; no JVD, CV; regular rate and rhythm with S1-S2 no murmur or gallop, Pulmonary; clear bilaterally, Abdomen ;soft nontender bowel sounds are active there is no palpable mass or hepatosplenomegaly, Rectal ;exam very small sentinel tag and on digital exam shallow fissure posteriorly stool heme-negative, Extremities; no clubbing cyanosis or edema skin warm dry, Psych; anxious as usual but appropriate        Assessment & Plan:  # 24 68 year old female with IBS currently well-controlled #2 acute rectal bleeding secondary to shallow anal fissure #3 diverticulosis #4 chronic anxiety and OCD  Plan; continue current regimen of Align, Benefiber, and MiraLax daily Add recticare/lidocaine gel 2-3 times daily over the next 3-5 days for discomfort secondary to shallow fissure then as needed. She will followup with Dr. Fuller Plan or myself as needed

## 2014-04-14 NOTE — Patient Instructions (Signed)
We have given you samples of Recticare ointment.  Use 2-3 times daily over the next few days.  Continue the [employer].  Take 1 dose of Miralax at bedtime nightly.

## 2014-04-19 ENCOUNTER — Ambulatory Visit (INDEPENDENT_AMBULATORY_CARE_PROVIDER_SITE_OTHER): Payer: Medicare Other | Admitting: Internal Medicine

## 2014-04-19 VITALS — BP 142/86 | HR 81 | Temp 98.3°F | Resp 16 | Ht 65.0 in | Wt 126.0 lb

## 2014-04-19 DIAGNOSIS — Z23 Encounter for immunization: Secondary | ICD-10-CM

## 2014-04-19 DIAGNOSIS — R059 Cough, unspecified: Secondary | ICD-10-CM

## 2014-04-19 DIAGNOSIS — R05 Cough: Secondary | ICD-10-CM

## 2014-04-19 MED ORDER — BENZONATATE 100 MG PO CAPS: 100.0000 mg | ORAL_CAPSULE | Freq: Two times a day (BID) | ORAL | Status: DC | PRN

## 2014-04-19 MED ORDER — AMOXICILLIN 875 MG PO TABS: 875.0000 mg | ORAL_TABLET | Freq: Two times a day (BID) | ORAL | Status: DC

## 2014-04-20 NOTE — Progress Notes (Signed)
   Subjective:    Patient ID: Ruth Walker, female    DOB: [birth year], 68 y.o.   MRN: [id number]  HPI  Chief Complaint  Patient presents with  . Cough    x 4 days  . Sinusitis  pt w/1-2 episodes like this yearly Started w/ runny nose and postnasal drainage 4d ago when with grandchildren who all are coughing for past 2 weeks. Now has an occasional nonproductive cough/lots of clear nasal drainage No fever      Review of Systems Noncontributory    Objective:   Physical Exam BP 142/86  Pulse 81  Temp(Src) 98.3 F (36.8 C) (Oral)  Resp 16  Ht 5\' 5"  (1.651 m)  Wt 126 lb (57.153 kg)  BMI 20.97 kg/m2  SpO2 100% TMs clear/conjunctiva clear/nares with clear rhinorrhea/non-tender maxillary to percussion No cervical adenopathy Lungs clear to auscultation       Assessment & Plan:  Cough secondary to viral URI  Tessalon Perles Needs flu shot - Plan: Flu Vaccine QUAD 36+ mos IM  Meds ordered this encounter  Medications  . benzonatate (TESSALON) 100 MG capsule    Sig: Take 1 capsule (100 mg total) by mouth 2 (two) times daily as needed for cough.    Dispense:  20 capsule    Refill:  0  . amoxicillin (AMOXIL) 875 MG tablet------ she is given this prescription to hold onto and will not use it unless she develops 2 days of purulent sinus discharge with headache     Sig: Take 1 tablet (875 mg total) by mouth 2 (two) times daily.    Dispense:  20 tablet    Refill:  0

## 2014-04-22 ENCOUNTER — Ambulatory Visit (INDEPENDENT_AMBULATORY_CARE_PROVIDER_SITE_OTHER): Payer: Medicare Other | Admitting: Family Medicine

## 2014-04-22 VITALS — BP 135/80 | HR 77 | Temp 98.4°F | Resp 18 | Ht 65.0 in | Wt 127.0 lb

## 2014-04-22 DIAGNOSIS — R05 Cough: Secondary | ICD-10-CM

## 2014-04-22 DIAGNOSIS — R059 Cough, unspecified: Secondary | ICD-10-CM

## 2014-04-22 DIAGNOSIS — J069 Acute upper respiratory infection, unspecified: Secondary | ICD-10-CM

## 2014-04-22 NOTE — Progress Notes (Signed)
 Chief Complaint:  Chief Complaint  Patient presents with  . Follow-up    not any better    HPI: Ruth Walker is a 68 y.o. female who is here for  1 week hx of URI sxs, she was see here actually on 10/11 by Dr Laney Pastor and was rx amoxacillin in the event that her sxs get worse, he thought it was more viral  Statrted amox about 1 day ago and wants to check her lungs. She is anxious. She is over at her daughter's house and her children are sick and she is obligated to stay there since her daughter has angst about being alone in the house while her husband is away on a boy's trip She is having white/yellow mucus production and feels plugged up in her chest but this is only upon waking up   No SOB or wheezing, pt w/1-2 episodes like this yearly , usually starts with  Started w/ runny nose and postnasal drainage   Past Medical History  Diagnosis Date  . Hemorrhoids   . Irritable bowel syndrome (IBS)   . Asthma     Gets allergy shots once a week   . Skin cancer     Leg  . Cholelithiasis   . Diverticulosis   . Anal fissure   . Anxiety states   . Uterine polyp   . Osteoporosis   . Vertebral compression fracture   . Environmental allergies   . Allergy   . Depression    Past Surgical History  Procedure Laterality Date  . Cesarean section    . Hemorrhoid surgery    . Back surgery    . Skin cancer excision      Leg   . Endometrial polypectomy    . Hysteroscopy    . Dilation and curettage of uterus    . Mouth surgery    . Fracture surgery      neck (vertebrae)  . Ibs     History   Social History  . Marital Status: Married    Spouse Name: N/A    Number of Children: 2  . Years of Education: N/A   Occupational History  . Retired    Social History Main Topics  . Smoking status: Never Smoker   . Smokeless tobacco: Never Used  . Alcohol Use: No  . Drug Use: No  . Sexual Activity: Yes    Birth Control/ Protection: Post-menopausal     Comment: number of sex  partners in the last 29 months  1   Other Topics Concern  . None   Social History Narrative   Patient gets regular exercise   Family History  Problem Relation Age of Onset  . Diabetes Mother   . Hypertension Mother   . Thyroid disease Mother   . Colon cancer Neg Hx   . Hypertension Father   . Heart disease Father   . Thyroid disease Sister    Allergies  Allergen Reactions  . Aloe   . Benadryl [Diphenhydramine Hcl] Other (See Comments)    Makes her feel "hyper"  . Cephalexin     REACTION: coarse rash  . Ciprofloxacin   . Mucinex [Guaifenesin Er] Other (See Comments)    Makes her feel worse  . Shellfish Allergy Hives  . Sudafed [Pseudoephedrine Hcl] Hives and Other (See Comments)    Keeps her awake  . Sulfonamide Derivatives    Prior to Admission medications   Medication Sig Start Date End  Date Taking? Authorizing Provider  amoxicillin (AMOXIL) 875 MG tablet Take 1 tablet (875 mg total) by mouth 2 (two) times daily. 04/19/14  Yes andrew Koyanagi, MD  aspirin 81 MG tablet Take 81 mg by mouth daily.     Yes Historical Provider, MD  benzonatate (TESSALON) 100 MG capsule Take 1 capsule (100 mg total) by mouth 2 (two) times daily as needed for cough. 04/19/14  Yes andrew Koyanagi, MD  Calcium Carbonate (CALCIUM 600 PO) Take 1 tablet by mouth 2 (two) times daily.   Yes Historical Provider, MD  cycloSPORINE (RESTASIS) 0.05 % ophthalmic emulsion 1 drop 2 (two) times daily.     Yes Historical Provider, MD  estradiol (ESTRACE VAGINAL) 0.1 MG/GM vaginal cream Place vaginally. As directed    Yes Historical Provider, MD  fluocinonide-emollient (LIDEX-E) 0.05 % cream Apply 1 application topically 2 (two) times daily. For heel 03/29/14  Yes [name], MD  fluticasone [facility]) 50 MCG/ACT nasal spray Place 2 sprays into both nostrils daily. 09/13/13  Yes  P , DO  hydrochlorothiazide (HYDRODIURIL) 25 MG tablet take 1 tablet by mouth once daily   Yes Terrance Mass, MD    metoprolol tartrate (LOPRESSOR) 25 MG tablet take 1 tablet by mouth twice a day 04/02/14  Yes [name], MD  polyethylene glycol (MIRALAX / GLYCOLAX) packet Take 17 g by mouth daily. dissolve in 8 oz. Of water at bedtime    Yes Historical Provider, MD  PRESCRIPTION MEDICATION Allergy injection once a week.[facility]   Yes Historical Provider, MD  Probiotic Product (ALIGN) 4 MG CAPS Take 1 capsule by mouth daily. 07/05/11  Yes Sable Feil, MD  triamcinolone cream (KENALOG) 0.1 % Apply 1 application topically 3 (three) times daily. 04/11/14  Yes [name], MD  Vitamin D, Ergocalciferol, (DRISDOL) 50000 UNITS CAPS capsule take 1 capsule by mouth EVERY 7 DAYS 04/13/14  Yes Terrance Mass, MD  Wheat Dextrin (BENEFIBER DRINK MIX PO) Take 10 mLs by mouth every morning. Take 2 teaspoons every morning     Yes Historical Provider, MD     ROS: The patient denies fevers, chills, night sweats, unintentional weight loss, chest pain, palpitations, wheezing, dyspnea on exertion, nausea, vomiting, abdominal pain, dysuria, hematuria, melena, numbness, weakness, or tingling.  All other systems have been reviewed and were otherwise negative with the exception of those mentioned in the HPI and as above.    PHYSICAL EXAM: Filed Vitals:   04/22/14 1225  BP: 135/80  Pulse: 77  Temp: 98.4 F (36.9 C)  Resp: 18   Filed Vitals:   04/22/14 1225  Height: 5\' 5"  (1.651 m)  Weight: 127 lb (57.607 kg)   Body mass index is 21.13 kg/(m^2).  General: Alert, no acute distress HEENT:  Normocephalic, atraumatic, oropharynx patent. EOMI, PERRLA, boggy nares, erythematous, + tender sinus Cardiovascular:  Regular rate and rhythm, no rubs murmurs or gallops.  No Carotid bruits, radial pulse intact. No pedal edema.  Respiratory: Clear to auscultation bilaterally.  No wheezes, rales, or rhonchi.  No cyanosis, no use of accessory musculature GI: No organomegaly, abdomen is soft and  non-tender, positive bowel sounds.  No masses. Skin: No rashes. Neurologic: Facial musculature symmetric. Psychiatric: Patient is appropriate throughout our interaction. Lymphatic: No cervical lymphadenopathy Musculoskeletal: Gait intact.   LABS: Results for orders placed in visit on 07/07/13  POCT CBC      Result Value Ref Range   WBC 9.9  4.6 - 10.2 K/uL  Lymph, poc 1.0  0.6 - 3.4   POC LYMPH PERCENT 10.4  10 - 50 %L   MID (cbc) 0.6  0 - 0.9   POC MID % 6.4  0 - 12 %M   POC Granulocyte 8.2 (*) 2 - 6.9   Granulocyte percent 83.2 (*) 37 - 80 %G   RBC 5.16  4.04 - 5.48 M/uL   Hemoglobin 15.7  12.2 - 16.2 g/dL   HCT, POC 48.8 (*) 37.7 - 47.9 %   MCV 94.6  80 - 97 fL   MCH, POC 30.4  27 - 31.2 pg   MCHC 32.2  31.8 - 35.4 g/dL   RDW, POC 13.3     Platelet Count, POC 231  142 - 424 K/uL   MPV 8.5  0 - 99.8 fL     EKG/XRAY:   Primary read interpreted by Dr. Marin Comment at [facility].   ASSESSMENT/PLAN: Encounter Diagnoses  Name Primary?  . Cough Yes  . Acute URI    Continue with amoxacillin and tesalon perles Do your normal allergy routine VSS, lungs are clear F/u prn   Gross sideeffects, risk and benefits, and alternatives of medications d/w patient. Patient is aware that all medications have potential sideeffects and we are unable to predict every sideeffect or drug-drug interaction that may occur.  , [place], DO 04/22/2014 1:04 PM

## 2014-04-22 NOTE — Patient Instructions (Signed)
Upper Respiratory Infection, Adult An upper respiratory infection (URI) is also sometimes known as the common cold. The upper respiratory tract includes the nose, sinuses, throat, trachea, and bronchi. Bronchi are the airways leading to the lungs. Most people improve within 1 week, but symptoms can last up to 2 weeks. A residual cough may last even longer.  CAUSES Many different viruses can infect the tissues lining the upper respiratory tract. The tissues become irritated and inflamed and often become very moist. Mucus production is also common. A cold is contagious. You can easily spread the virus to others by oral contact. This includes kissing, sharing a glass, coughing, or sneezing. Touching your mouth or nose and then touching a surface, which is then touched by another person, can also spread the virus. SYMPTOMS  Symptoms typically develop 1 to 3 days after you come in contact with a cold virus. Symptoms vary from person to person. They may include:  Runny nose.  Sneezing.  Nasal congestion.  Sinus irritation.  Sore throat.  Loss of voice (laryngitis).  Cough.  Fatigue.  Muscle aches.  Loss of appetite.  Headache.  Low-grade fever. DIAGNOSIS  You might diagnose your own cold based on familiar symptoms, since most people get a cold 2 to 3 times a year. Your caregiver can confirm this based on your exam. Most importantly, your caregiver can check that your symptoms are not due to another disease such as strep throat, sinusitis, pneumonia, asthma, or epiglottitis. Blood tests, throat tests, and X-rays are not necessary to diagnose a common cold, but they may sometimes be helpful in excluding other more serious diseases. Your caregiver will decide if any further tests are required. RISKS AND COMPLICATIONS  You may be at risk for a more severe case of the common cold if you smoke cigarettes, have chronic heart disease (such as heart failure) or lung disease (such as asthma), or if  you have a weakened immune system. The very young and very old are also at risk for more serious infections. Bacterial sinusitis, middle ear infections, and bacterial pneumonia can complicate the common cold. The common cold can worsen asthma and chronic obstructive pulmonary disease (COPD). Sometimes, these complications can require emergency medical care and may be life-threatening. PREVENTION  The best way to protect against getting a cold is to practice good hygiene. Avoid oral or hand contact with people with cold symptoms. Wash your hands often if contact occurs. There is no clear evidence that vitamin C, vitamin E, echinacea, or exercise reduces the chance of developing a cold. However, it is always recommended to get plenty of rest and practice good nutrition. TREATMENT  Treatment is directed at relieving symptoms. There is no cure. Antibiotics are not effective, because the infection is caused by a virus, not by bacteria. Treatment may include:  Increased fluid intake. Sports drinks offer valuable electrolytes, sugars, and fluids.  Breathing heated mist or steam (vaporizer or shower).  Eating chicken soup or other clear broths, and maintaining good nutrition.  Getting plenty of rest.  Using gargles or lozenges for comfort.  Controlling fevers with ibuprofen or acetaminophen as directed by your caregiver.  Increasing usage of your inhaler if you have asthma. Zinc gel and zinc lozenges, taken in the first 24 hours of the common cold, can shorten the duration and lessen the severity of symptoms. Pain medicines may help with fever, muscle aches, and throat pain. A variety of non-prescription medicines are available to treat congestion and runny nose. Your caregiver   can make recommendations and may suggest nasal or lung inhalers for other symptoms.  HOME CARE INSTRUCTIONS   Only take over-the-counter or prescription medicines for pain, discomfort, or fever as directed by your  caregiver.  Use a warm mist humidifier or inhale steam from a shower to increase air moisture. This may keep secretions moist and make it easier to breathe.  Drink enough water and fluids to keep your urine clear or pale yellow.  Rest as needed.  Return to work when your temperature has returned to normal or as your caregiver advises. You may need to stay home longer to avoid infecting others. You can also use a face mask and careful hand washing to prevent spread of the virus. SEEK MEDICAL CARE IF:   After the first few days, you feel you are getting worse rather than better.  You need your caregiver's advice about medicines to control symptoms.  You develop chills, worsening shortness of breath, or brown or red sputum. These may be signs of pneumonia.  You develop yellow or brown nasal discharge or pain in the face, especially when you bend forward. These may be signs of sinusitis.  You develop a fever, swollen neck glands, pain with swallowing, or white areas in the back of your throat. These may be signs of strep throat. SEEK IMMEDIATE MEDICAL CARE IF:   You have a fever.  You develop severe or persistent headache, ear pain, sinus pain, or chest pain.  You develop wheezing, a prolonged cough, cough up blood, or have a change in your usual mucus (if you have chronic lung disease).  You develop sore muscles or a stiff neck. Document Released: 12/20/2000 Document Revised: 09/18/2011 Document Reviewed: 10/01/2013 Exit[employer] Patient Information 2015 [employer]. This information is not intended to replace advice given to you by your health care provider. Make sure you discuss any questions you have with your health care provider.  

## 2014-05-03 ENCOUNTER — Telehealth: Payer: Self-pay

## 2014-05-03 NOTE — Telephone Encounter (Signed)
Okay to use the kenalog cream

## 2014-05-03 NOTE — Telephone Encounter (Signed)
Patient says she changed razors and the new razor has caused a rash on her chin. She wants to start taking the kenalog again, but wants to be sure it is ok.

## 2014-05-04 NOTE — Telephone Encounter (Signed)
Advised pt to go ahead and use cream.

## 2014-05-08 ENCOUNTER — Telehealth: Payer: Self-pay | Admitting: *Deleted

## 2014-05-08 ENCOUNTER — Ambulatory Visit (INDEPENDENT_AMBULATORY_CARE_PROVIDER_SITE_OTHER): Payer: Medicare Other | Admitting: Family Medicine

## 2014-05-08 VITALS — BP 132/80 | HR 71 | Temp 97.4°F | Resp 16 | Ht 65.0 in | Wt 127.2 lb

## 2014-05-08 DIAGNOSIS — R0981 Nasal congestion: Secondary | ICD-10-CM

## 2014-05-08 DIAGNOSIS — J0101 Acute recurrent maxillary sinusitis: Secondary | ICD-10-CM

## 2014-05-08 DIAGNOSIS — I10 Essential (primary) hypertension: Secondary | ICD-10-CM

## 2014-05-08 MED ORDER — AZITHROMYCIN 250 MG PO TABS: ORAL_TABLET | ORAL | Status: DC

## 2014-05-08 MED ORDER — FLUTICASONE PROPIONATE 50 MCG/ACT NA SUSP: 2.0000 | Freq: Every day | NASAL | Status: DC

## 2014-05-08 MED ORDER — HYDROCHLOROTHIAZIDE 25 MG PO TABS: ORAL_TABLET | ORAL | Status: DC

## 2014-05-08 NOTE — Telephone Encounter (Signed)
Pt said she had her PCP to fill medication.

## 2014-05-08 NOTE — Telephone Encounter (Signed)
Pt called requesting a refill on medication, I left message for pt to call to get the name of medication.

## 2014-05-08 NOTE — Progress Notes (Signed)
   Subjective:    Patient ID: Ruth Walker, female    DOB: 1946-06-30, 68 y.o.   MRN: [id number] This chart was scribed for [name], MD by Zola Button, Medical Scribe. This patient was seen in Room 8 and the patient's care was started at 11:00 AM.   HPI HPI Comments: Note from last visit with Dr. Marin Comment 04/22/14: IMBERLY Walker is a 68 y.o. female who is here for 1 week hx of URI sxs, she was see here actually on 10/11 by Dr Laney Pastor and was rx amoxacillin in the event that her sxs get worse, he thought it was more viral  Statrted amox about 1 day ago and wants to check her lungs. She is anxious. She is over at her daughter's house and her children are sick and she is obligated to stay there since her daughter has angst about being alone in the house while her husband is away on a boy's trip  She is having white/yellow mucus production and feels plugged up in her chest but this is only upon waking up  No SOB or wheezing, pt w/1-2 episodes like this yearly , usually starts with  Started w/ runny nose and postnasal drainage   Since last visit: Ruth Walker is a 68 y.o. female who presents to the [facility] for a follow-up for URI symptoms. Patient notes still having cough and nasal drainage as of 3-4 weeks. She also notes having HA. She has skipped her allergy shot for 2 weeks. Patient has taken amoxicillin without relief; she has also stopped taken Claritin. Patient has run out of Flonase. She has been going to the gym, which has been making her feel better. She has allergies to Sulfa and Cipro. Patient denies fever.    Review of Systems  Constitutional: Negative for fever.  HENT: Positive for postnasal drip and rhinorrhea.   Eyes: Negative for discharge.  Respiratory: Positive for cough. Negative for shortness of breath and wheezing.   Cardiovascular: Negative for chest pain.  Genitourinary: Negative for hematuria.  Musculoskeletal: Negative for back pain.    Skin: Negative for rash.  Neurological: Positive for headaches.  Psychiatric/Behavioral: Negative for confusion.       Objective:   Physical Exam CONSTITUTIONAL: Well developed/well nourished HEAD: Normocephalic/atraumatic EYES: EOM/PERRL ENMT: Mucous membranes moist NECK: supple no meningeal signs SPINE: entire spine nontender CV: S1/S2 noted, no murmurs/rubs/gallops noted LUNGS: Lungs are clear to auscultation bilaterally, no apparent distress ABDOMEN: soft, nontender, no rebound or guarding GU: no cva tenderness NEURO: Pt is awake/alert, moves all extremitiesx4 EXTREMITIES: pulses normal, full ROM SKIN: warm, color normal PSYCH: no abnormalities of mood noted      Assessment & Plan:    Acute recurrent maxillary sinusitis - Plan: fluticasone (FLONASE) 50 MCG/ACT nasal spray, azithromycin (ZITHROMAX Z-PAK) 250 MG tablet  Nasal congestion - Plan: fluticasone (FLONASE) 50 MCG/ACT nasal spray, azithromycin (ZITHROMAX Z-PAK) 250 MG tablet  Essential hypertension - Plan: hydrochlorothiazide (HYDRODIURIL) 25 MG tablet  Signed, [name], MD

## 2014-06-10 ENCOUNTER — Ambulatory Visit (INDEPENDENT_AMBULATORY_CARE_PROVIDER_SITE_OTHER): Payer: Medicare Other | Admitting: Family Medicine

## 2014-06-10 VITALS — BP 118/80 | HR 73 | Temp 98.1°F | Resp 16 | Ht 65.75 in | Wt 125.0 lb

## 2014-06-10 DIAGNOSIS — R0981 Nasal congestion: Secondary | ICD-10-CM

## 2014-06-10 DIAGNOSIS — L710 Perioral dermatitis: Secondary | ICD-10-CM

## 2014-06-10 MED ORDER — TRIAMCINOLONE ACETONIDE 0.1 % EX CREA: 1.0000 "application " | TOPICAL_CREAM | Freq: Two times a day (BID) | CUTANEOUS | Status: DC

## 2014-06-10 MED ORDER — DOXYCYCLINE HYCLATE 100 MG PO TABS: 100.0000 mg | ORAL_TABLET | Freq: Every day | ORAL | Status: DC

## 2014-06-10 NOTE — Progress Notes (Signed)
This chart was scribed for Ruth Haber, MD by Ruth Walker, ED Scribe. This patient was seen in room 3 and the patient's care was started at 11:41 AM.   Patient ID: BATOOL Ruth MRN: [id number], DOB: [birth year], 68 y.o. Date of Encounter: 06/10/2014, 11:41 AM  Primary Physician: Ruth Haber, MD  Chief Complaint:   HPI: 68 y.o. year old female with history below presents with nasal congestion with associated postnasal drip with onset 1 week ago; she also complains of a recurrent rash to her chin. She states she has been using Noxzema shaving cream and shaving with a double bladed razor. She states she has a consultation with an "Angie" tomorrow night for her rash. She denies cough.  She states her grandson, Ruth Walker, recently enjoyed being in a production of the Wizard of Oz and is doing well in school.   Past Medical History  Diagnosis Date   Hemorrhoids    Irritable bowel syndrome (IBS)    Asthma     Gets allergy shots once a week    Skin cancer     Leg   Cholelithiasis    Diverticulosis    Anal fissure    Anxiety states    Uterine polyp    Osteoporosis    Vertebral compression fracture    Environmental allergies    Allergy    Depression      Home Meds: Prior to Admission medications   Medication Sig Start Date End Date Taking? Authorizing Provider  aspirin 81 MG tablet Take 81 mg by mouth daily.     Yes Historical Provider, MD  Calcium Carbonate (CALCIUM 600 PO) Take 1 tablet by mouth 2 (two) times daily.   Yes Historical Provider, MD  cycloSPORINE (RESTASIS) 0.05 % ophthalmic emulsion 1 drop 2 (two) times daily.     Yes Historical Provider, MD  estradiol (ESTRACE VAGINAL) 0.1 MG/GM vaginal cream Place vaginally. As directed    Yes Historical Provider, MD  fluticasone (FLONASE) 50 MCG/ACT nasal spray Place 2 sprays into both nostrils daily. 05/08/14  Yes Ruth Haber, MD  hydrochlorothiazide (HYDRODIURIL) 25 MG tablet take 1 tablet by mouth once  daily 05/08/14  Yes Ruth Haber, MD  metoprolol tartrate (LOPRESSOR) 25 MG tablet take 1 tablet by mouth twice a day 04/02/14  Yes Ruth Haber, MD  polyethylene glycol (MIRALAX / GLYCOLAX) packet Take 17 g by mouth daily. dissolve in 8 oz. Of water at bedtime    Yes Historical Provider, MD  PRESCRIPTION MEDICATION Allergy injection once a week.[facility] [employer]   Yes Historical Provider, MD  Probiotic Product (ALIGN) 4 MG CAPS Take 1 capsule by mouth daily. 07/05/11  Yes Sable Feil, MD  Vitamin D, Ergocalciferol, (DRISDOL) 50000 UNITS CAPS capsule take 1 capsule by mouth EVERY 7 DAYS 04/13/14  Yes Terrance Mass, MD  Wheat Dextrin (BENEFIBER DRINK MIX PO) Take 10 mLs by mouth every morning. Take 2 teaspoons every morning     Yes Historical Provider, MD  fluocinonide-emollient (LIDEX-E) 0.05 % cream Apply 1 application topically 2 (two) times daily. For heel Patient not taking: Reported on 06/10/2014 03/29/14   Ruth Haber, MD  triamcinolone cream (KENALOG) 0.1 % Apply 1 application topically 3 (three) times daily. Patient not taking: Reported on 06/10/2014 04/11/14   Ruth Haber, MD    Allergies:  Allergies  Allergen Reactions   Aloe    Benadryl [Diphenhydramine Hcl] Other (See Comments)    Makes her feel "hyper"   Cephalexin  REACTION: coarse rash   Ciprofloxacin    Mucinex [Guaifenesin Er] Other (See Comments)    Makes her feel worse   Shellfish Allergy Hives   Sudafed [Pseudoephedrine Hcl] Hives and Other (See Comments)    Keeps her awake   Sulfonamide Derivatives     History   Social History   Marital Status: Married    Spouse Name: N/A    Number of Children: 2   Years of Education: N/A   Occupational History   Retired    Social History Main Topics   Smoking status: Never Smoker    Smokeless tobacco: Never Used   Alcohol Use: No   Drug Use: No   Sexual Activity: Yes    Museum/gallery curator: Post-menopausal      Comment: number of sex partners in the last 46 months  1   Other Topics Concern   Not on file   Social History Narrative   Patient gets regular exercise     Review of Systems: Constitutional: negative for chills, fever, night sweats, weight changes, or fatigue  HEENT: negative for vision changes, hearing loss, rhinorrhea, ST, epistaxis, or sinus pressure Cardiovascular: negative for chest pain or palpitations, positive for nasal congestion, postnasal drip Respiratory: negative for hemoptysis, wheezing, shortness of breath, or cough Abdominal: negative for abdominal pain, nausea, vomiting, diarrhea, or constipation Dermatological: positive for rash Neurologic: negative for headache, dizziness, or syncope All other systems reviewed and are otherwise negative with the exception to those above and in the HPI.   Physical Exam: Blood pressure 118/80, pulse 73, temperature 98.1 F (36.7 C), temperature source Oral, resp. rate 16, height 5' 5.75" (1.67 m), weight 125 lb (56.7 kg), SpO2 100 %., Body mass index is 20.33 kg/(m^2). General: Well developed, well nourished, in no acute distress. Head: Normocephalic, atraumatic, eyes without discharge, sclera non-icteric, nares are without discharge. Bilateral auditory canals clear, TM's are without perforation, pearly grey and translucent with reflective cone of light bilaterally. Oral cavity moist, posterior pharynx without exudate, erythema, peritonsillar abscess, or post nasal drip.  Neck: Supple. No thyromegaly. Full ROM. No lymphadenopathy. Lungs: Clear bilaterally to auscultation without wheezes, rales, or rhonchi. Breathing is unlabored. Heart: RRR with S1 S2. No murmurs, rubs, or gallops appreciated. Abdomen: Soft, non-tender, non-distended with normoactive bowel sounds. No hepatomegaly. No rebound/guarding. No obvious abdominal masses. Msk:  Strength and tone normal for age. Extremities/Skin: Warm and dry. Moderate perioral dermatitis  with thickening and lichenification particularly on the right lower jaw. Neuro: Alert and oriented X 3. Moves all extremities spontaneously. Gait is normal. CNII-XII grossly in tact. Psych:  Responds to questions appropriately with a normal affect.   ASSESSMENT AND PLAN:  68 y.o. year old female with Perioral dermatitis - Plan: doxycycline (VIBRA-TABS) 100 MG tablet, triamcinolone cream (KENALOG) 0.1 %  Nasal congestion   Signed, Ruth Haber, MD 06/10/2014 11:41 AM

## 2014-06-17 ENCOUNTER — Telehealth: Payer: Self-pay

## 2014-06-17 NOTE — Telephone Encounter (Signed)
Pt is aware of the answer from dr Joseph Art

## 2014-06-17 NOTE — Telephone Encounter (Signed)
Use the cream for 2 weeks, then discontinue for two weeks.  Repeat for 2 weeks if necessary

## 2014-06-17 NOTE — Telephone Encounter (Signed)
Patient calling to ask Dr. Carlean Jews; how long to use the cream triamcinolone cream (KENALOG) 0.1 % for her rash. She has been using the cream since last wed. She says its a little better but still irritated. Please advise.   Best: [phone]

## 2014-06-17 NOTE — Telephone Encounter (Signed)
Please advise how long she should be using this cream.

## 2014-06-29 ENCOUNTER — Ambulatory Visit (INDEPENDENT_AMBULATORY_CARE_PROVIDER_SITE_OTHER): Payer: Medicare Other | Admitting: Gynecology

## 2014-06-29 ENCOUNTER — Encounter: Payer: Self-pay | Admitting: Gynecology

## 2014-06-29 VITALS — BP 134/72 | Ht 65.5 in | Wt 127.6 lb

## 2014-06-29 DIAGNOSIS — M810 Age-related osteoporosis without current pathological fracture: Secondary | ICD-10-CM

## 2014-06-29 DIAGNOSIS — L680 Hirsutism: Secondary | ICD-10-CM

## 2014-06-29 DIAGNOSIS — Z79890 Hormone replacement therapy (postmenopausal): Secondary | ICD-10-CM

## 2014-06-29 DIAGNOSIS — N952 Postmenopausal atrophic vaginitis: Secondary | ICD-10-CM

## 2014-06-29 MED ORDER — ESTRADIOL 0.1 MG/GM VA CREA: 2.0000 g | TOPICAL_CREAM | VAGINAL | Status: DC

## 2014-06-29 NOTE — Progress Notes (Signed)
Ruth Walker Dec 08, 1945 [id number]   History:    68 y.o.  for follow-up on her osteoporosis and GYN exam. Patient's primary physician is Dr. Theda Belfast who has been doing her lab work. Review of her records as her last bone density study and her past history of osteoporosis as quoted from my partners last note on July 11th 2013 as follows:  "Patient was not certain whether to reinitiate treatment for her osteoporosis.Her T score on her recent bone density was -2.5. Her spine actually showed significant improvement which was felt to be secondary to degenerative changes. Although there was a slight reduction in bone density of the hip it was not statistically significant. The patient has been on either Fosamax or Actonel since 2004. She has had a vertebral compression fracture when she fell in the shower but her orthopedist felt it was traumatic and not a fragility fracture. We had a very long discussion about whether to continue to treat her. I told her I favored switching drugs after these years. I explained her that if we kept her on drug holiday the fosamax Stays in her system for a significant period of time. She understands that if she's treated she might have a fragility fracture and if she's not treated she might not fracture and therefore neither is perfect. Due to her bone density still showing osteoporosis I favored continued treatment and we discussed Prolia, Reclast and Forteo. I told her I favored Prolia."  Patient has not started any medication. She is taking vitamin D 50,000 units every other week. Patient suffers from vaginal atrophy for which she's takes Estrace vaginal cream twice a week. Patient with no past history of any abnormal Pap smears in the past. She had a normal colonoscopy in 2010. Patient's vaccines are up-to-date provided by her PCP.   Past medical history,surgical history, family history and social history were all reviewed and documented in the EPIC  chart.  Gynecologic History No LMP recorded. Patient is postmenopausal. Contraception: post menopausal status Last Pap: 2013. Results were: normal Last mammogram: 2015. Results were: normal  Obstetric History OB History  Gravida Para Term Preterm AB SAB TAB Ectopic Multiple Living  2 2 2       2     # Outcome Date GA Lbr Len/2nd Weight Sex Delivery Anes PTL Lv  2 Term           1 Term                ROS: A ROS was performed and pertinent positives and negatives are included in the history.  GENERAL: No fevers or chills. HEENT: No change in vision, no earache, sore throat or sinus congestion. NECK: No pain or stiffness. CARDIOVASCULAR: No chest pain or pressure. No palpitations. PULMONARY: No shortness of breath, cough or wheeze. GASTROINTESTINAL: No abdominal pain, nausea, vomiting or diarrhea, melena or bright red blood per rectum. GENITOURINARY: No urinary frequency, urgency, hesitancy or dysuria. MUSCULOSKELETAL: No joint or muscle pain, no back pain, no recent trauma. DERMATOLOGIC: No rash, no itching, no lesions. ENDOCRINE: No polyuria, polydipsia, no heat or cold intolerance. No recent change in weight. HEMATOLOGICAL: No anemia or easy bruising or bleeding. NEUROLOGIC: No headache, seizures, numbness, tingling or weakness. PSYCHIATRIC: No depression, no loss of interest in normal activity or change in sleep pattern.     Exam: chaperone present  BP 134/72 mmHg  Ht 5' 5.5" (1.664 m)  Wt 127 lb 9.6 oz (57.879 kg)  BMI 20.90  kg/m2  Body mass index is 20.9 kg/(m^2).  General appearance : Well developed well nourished female. No acute distress HEENT: Neck supple, trachea midline, no carotid bruits, no thyroidmegaly Lungs: Clear to auscultation, no rhonchi or wheezes, or rib retractions  Heart: Regular rate and rhythm, no murmurs or gallops Breast:Examined in sitting and supine position were symmetrical in appearance, no palpable masses or tenderness,  no skin retraction, no  nipple inversion, no nipple discharge, no skin discoloration, no axillary or supraclavicular lymphadenopathy Abdomen: no palpable masses or tenderness, no rebound or guarding Extremities: no edema or skin discoloration or tenderness  Pelvic:  Bartholin, Urethra, Skene Glands: Within normal limits             Vagina: No gross lesions or discharge, atrophic changes Cervix: No gross lesions or discharge  Uterus  axial, normal size, shape and consistency, non-tender and mobile  Adnexa  Without masses or tenderness  Anus and perineum  normal   Rectovaginal  normal sphincter tone without palpated masses or tenderness             Hemoccult PCP provides     Assessment/Plan:  68 y.o. female for annual exam with history of vertebral fracture attributed to trauma and not fragility fracture all of the patient was standing when she fell and fractured her vertebra. Patient with prior history of anti-resorptive agent for several years has been on drug-free holiday for past 3 years.Patient's lowest T. Score at the right femoral neck -2.5 in the osteoporotic range. We discussed today that based on last years FRAX analysis indicating that she exceeded the ten-year threshold for hip fracture 3.6%. I concur with my retired partner who had recommended that she go on Prolia 60 mg subcutaneous every 6 months (monoclonal antibody). Patient previously been provided with literature information and she would like to wait till this next months bone density study shows and discuss in consultation her options. We discussed importance of monthly self breast examination. We discussed importance of weightbearing exercises. Pap smear no longer needed according to the new guidelines. PCP will be drawn her blood work. We'll schedule an ultrasound and 2-3 months due to the fact that time she had noted increased hair growth nourished genitalia and on her chin. I did not palpate any masses on her ovaries. If this persists we'll also do  additional blood work. This may be part of aging.    Terrance Mass MD, 5:06 PM 06/29/2014

## 2014-06-29 NOTE — Patient Instructions (Signed)

## 2014-07-09 ENCOUNTER — Ambulatory Visit (INDEPENDENT_AMBULATORY_CARE_PROVIDER_SITE_OTHER): Payer: Medicare Other | Admitting: Family Medicine

## 2014-07-09 VITALS — BP 132/68 | HR 65 | Temp 98.4°F | Resp 16 | Ht 65.5 in | Wt 128.6 lb

## 2014-07-09 DIAGNOSIS — R0981 Nasal congestion: Secondary | ICD-10-CM

## 2014-07-09 DIAGNOSIS — J0101 Acute recurrent maxillary sinusitis: Secondary | ICD-10-CM

## 2014-07-09 MED ORDER — FLUTICASONE PROPIONATE 50 MCG/ACT NA SUSP: 2.0000 | Freq: Every day | NASAL | Status: DC

## 2014-07-09 MED ORDER — AMOXICILLIN-POT CLAVULANATE 875-125 MG PO TABS: 1.0000 | ORAL_TABLET | Freq: Two times a day (BID) | ORAL | Status: DC

## 2014-07-09 NOTE — Progress Notes (Signed)
Subjective

## 2014-07-09 NOTE — Patient Instructions (Signed)
Take Augmentin twice daily with food  Use fluticasone nose spray 2 sprays each nostril twice daily for 3 days, then resume using once daily  Hold the doxycycline until you finish the Augmentin  Take a probiotic to help soothe the intestines while on the antibiotic  Drink plenty of fluids and get enough rest  Return if worse

## 2014-07-12 ENCOUNTER — Ambulatory Visit (INDEPENDENT_AMBULATORY_CARE_PROVIDER_SITE_OTHER): Payer: Medicare Other | Admitting: Family Medicine

## 2014-07-12 VITALS — BP 154/92 | HR 82 | Temp 98.4°F | Resp 19 | Ht 66.0 in | Wt 126.6 lb

## 2014-07-12 DIAGNOSIS — J209 Acute bronchitis, unspecified: Secondary | ICD-10-CM

## 2014-07-12 NOTE — Progress Notes (Signed)
Subjective:    Patient ID: Ruth Walker, female    DOB: 1945-08-31, 69 y.o.   MRN: [id number]  HPI Chief Complaint  Patient presents with   Follow-up    was here Thursday, not feeling any better   This chart was scribed for [name], MD by Thea Alken, ED Scribe. This patient was seen in room 3 and the patient's care was started at 2:53 PM.  HPI Comments: Ruth Walker is a 69 y.o. female who presents to the [facility] for a follow up. Pt was seen here 3 days ago for maxillary sinusitis by Dr. Linna Darner and was prescribed flonase and augmentin. Pt reports she now has worsening drainage. She states she tried flonase but stop using medication 2 days after being seen due to medication burning. Pt states she did not take doxycycline. Pt has had sick contacts, her grandchildren who've had a croupy cough.   Past Medical History  Diagnosis Date   Hemorrhoids    Irritable bowel syndrome (IBS)    Asthma     Gets allergy shots once a week    Skin cancer     Leg   Cholelithiasis    Diverticulosis    Anal fissure    Anxiety states    Uterine polyp    Osteoporosis    Vertebral compression fracture    Environmental allergies    Allergy    Depression    Allergies  Allergen Reactions   Aloe    Benadryl [Diphenhydramine Hcl] Other (See Comments)    Makes her feel "hyper"   Cephalexin     REACTION: coarse rash   Ciprofloxacin    Mucinex [Guaifenesin Er] Other (See Comments)    Makes her feel worse   Shellfish Allergy Hives   Sudafed [Pseudoephedrine Hcl] Hives and Other (See Comments)    Keeps her awake   Sulfonamide Derivatives    Prior to Admission medications   Medication Sig Start Date End Date Taking? Authorizing Provider  amoxicillin-clavulanate (AUGMENTIN) 875-125 MG per tablet Take 1 tablet by mouth 2 (two) times daily. 07/09/14  Yes Posey Boyer, MD  aspirin 81 MG tablet Take 81 mg by mouth daily.     Yes Historical  Provider, MD  Calcium Carbonate (CALCIUM 600 PO) Take 1 tablet by mouth 2 (two) times daily.   Yes Historical Provider, MD  cycloSPORINE (RESTASIS) 0.05 % ophthalmic emulsion 1 drop 2 (two) times daily.     Yes Historical Provider, MD  doxycycline (VIBRA-TABS) 100 MG tablet Take 1 tablet (100 mg total) by mouth daily. 06/10/14  Yes [name], MD  estradiol (ESTRACE) 0.1 MG/GM vaginal cream Place 5.46 Applicatorfuls vaginally 2 (two) times a week. 06/29/14  Yes Terrance Mass, MD  fluocinonide-emollient (LIDEX-E) 0.05 % cream Apply 1 application topically 2 (two) times daily. For heel 03/29/14  Yes [name], MD  fluticasone [facility]) 50 MCG/ACT nasal spray Place 2 sprays into both nostrils daily. 07/09/14  Yes Posey Boyer, MD  hydrochlorothiazide (HYDRODIURIL) 25 MG tablet take 1 tablet by mouth once daily 05/08/14  Yes [name], MD  metoprolol tartrate (LOPRESSOR) 25 MG tablet take 1 tablet by mouth twice a day 04/02/14  Yes [name], MD  polyethylene glycol (MIRALAX / GLYCOLAX) packet Take 17 g by mouth daily. dissolve in 8 oz. Of water at bedtime    Yes Historical Provider, MD  PRESCRIPTION MEDICATION Allergy injection once a week.[facility]   Yes  Historical Provider, MD  Probiotic Product (ALIGN) 4 MG CAPS Take 1 capsule by mouth daily. 07/05/11  Yes Sable Feil, MD  triamcinolone cream (KENALOG) 0.1 % Apply 1 application topically 2 (two) times daily. 06/10/14  Yes [name], MD  Vitamin D, Ergocalciferol, (DRISDOL) 50000 UNITS CAPS capsule take 1 capsule by mouth EVERY 7 DAYS 04/13/14  Yes Terrance Mass, MD  Wheat Dextrin (BENEFIBER DRINK MIX PO) Take 10 mLs by mouth every morning. Take 2 teaspoons every morning     Yes Historical Provider, MD   Review of Systems  Constitutional: Negative for fever and chills.  HENT: Positive for congestion and sinus pressure.    Objective:   Physical Exam  Constitutional: She is oriented to  person, place, and time. She appears well-developed and well-nourished. No distress.  HENT:  Head: Normocephalic and atraumatic.  Right Ear: External ear normal.  Left Ear: External ear normal.  Nose: Nose normal.  Mouth/Throat: Oropharynx is clear and moist.  Nasal passage clear and pink  Eyes: Conjunctivae and EOM are normal. Pupils are equal, round, and reactive to light.  Neck: Neck supple.  Cardiovascular: Normal rate.   Pulmonary/Chest: Effort normal. She has wheezes ( faint, expiratory, bilaterally).  Musculoskeletal: Normal range of motion.  Neurological: She is alert and oriented to person, place, and time.  Skin: Skin is warm and dry.  Psychiatric: She has a normal mood and affect. Her behavior is normal.  Nursing note and vitals reviewed.  Assessment & Plan:   This chart was scribed in my presence and reviewed by me personally.    ICD-9-CM ICD-10-CM   1. Acute bronchitis, unspecified organism 466.0 J20.9    Finish the augmentin and call me in 2-3 days if not improving  Signed, [name], MD

## 2014-07-13 MED ORDER — AZITHROMYCIN 250 MG PO TABS: ORAL_TABLET | ORAL | Status: DC

## 2014-07-13 NOTE — Addendum Note (Signed)
Addended by: [name] on: 07/13/2014 01:20 PM   Modules accepted: Orders

## 2014-09-23 ENCOUNTER — Ambulatory Visit (INDEPENDENT_AMBULATORY_CARE_PROVIDER_SITE_OTHER): Payer: Medicare Other | Admitting: Family Medicine

## 2014-09-23 VITALS — BP 122/85 | HR 79 | Temp 98.3°F | Resp 18 | Wt 131.0 lb

## 2014-09-23 DIAGNOSIS — R05 Cough: Secondary | ICD-10-CM

## 2014-09-23 DIAGNOSIS — J302 Other seasonal allergic rhinitis: Secondary | ICD-10-CM | POA: Diagnosis not present

## 2014-09-23 DIAGNOSIS — R059 Cough, unspecified: Secondary | ICD-10-CM

## 2014-09-23 DIAGNOSIS — E785 Hyperlipidemia, unspecified: Secondary | ICD-10-CM | POA: Diagnosis not present

## 2014-09-23 DIAGNOSIS — I10 Essential (primary) hypertension: Secondary | ICD-10-CM | POA: Diagnosis not present

## 2014-09-23 DIAGNOSIS — R0981 Nasal congestion: Secondary | ICD-10-CM

## 2014-09-23 LAB — LIPID PANEL
CHOLESTEROL: 190 mg/dL (ref 0–200)
HDL: 89 mg/dL (ref >=46)
LDL Cholesterol: 90 mg/dL (ref 0–99)
Total CHOL/HDL Ratio: 2.1 Ratio
Triglycerides: 55 mg/dL (ref <150)
VLDL: 11 mg/dL (ref 0–40)

## 2014-09-23 LAB — COMPREHENSIVE METABOLIC PANEL
ALT: 18 U/L (ref 0–35)
AST: 19 U/L (ref 0–37)
Albumin: 4.6 g/dL (ref 3.5–5.2)
Alkaline Phosphatase: 86 U/L (ref 39–117)
BILIRUBIN TOTAL: 0.5 mg/dL (ref 0.2–1.2)
BUN: 13 mg/dL (ref 6–23)
CHLORIDE: 99 meq/L (ref 96–112)
CO2: 30 meq/L (ref 19–32)
CREATININE: 0.7 mg/dL (ref 0.50–1.10)
Calcium: 10.4 mg/dL (ref 8.4–10.5)
Glucose, Bld: 120 mg/dL — ABNORMAL HIGH (ref 70–99)
Potassium: 4.3 mEq/L (ref 3.5–5.3)
Sodium: 137 mEq/L (ref 135–145)
Total Protein: 7.7 g/dL (ref 6.0–8.3)

## 2014-09-23 MED ORDER — FLUTICASONE PROPIONATE 50 MCG/ACT NA SUSP: 2.0000 | Freq: Every day | NASAL | Status: DC

## 2014-09-23 MED ORDER — BENZONATATE 100 MG PO CAPS: 100.0000 mg | ORAL_CAPSULE | Freq: Three times a day (TID) | ORAL | Status: DC | PRN

## 2014-09-23 NOTE — Progress Notes (Signed)
Subjective: Patient is here with a complaint of having spring allergy starting out. She has a history of taking allergy shots, which is just recently get back on. She is out of fluticasone. She's not been taking any antihistamines. She also wants to know if he should get her labs checked for her lipids and high blood pressure. She did eat a little cereal this morning. We talked about and decided to go ahead and check. She is feeling well otherwise.  Objective: Talkative. TMs normal. Chest clear process. Heart regular without murmurs.  Assessment: Seasonal allergic rhinitis Hypertension Hyperlipidemia  Plan: C met and lipids Fexofenadine Fluticasone nose spray. Instructed her how to use it since she had not been using it correctly

## 2014-09-23 NOTE — Patient Instructions (Addendum)
Take over-the-counter Allegra (generic is fexofenadine) one daily  Use the fluticasone (Flonase) nose spray 2 sprays twice daily for about 3 days, then once daily  Take benzonatate cough pills 1-2 pills 3 times daily  If not improving there are some other medications we can go to if necessary  Return as needed

## 2014-09-25 ENCOUNTER — Telehealth: Payer: Self-pay | Admitting: Family Medicine

## 2014-09-25 NOTE — Telephone Encounter (Signed)
Patient called to have her lab results explained to her.  I am out of town. Please call her.

## 2014-09-27 ENCOUNTER — Other Ambulatory Visit: Payer: Self-pay | Admitting: Gynecology

## 2014-09-28 NOTE — Telephone Encounter (Signed)
Have Ruth Walker come in for a Vitamin D level this week. (Osteoporosis and vit d def history). Tell her that I saw she was taking Vit D every other week not every week. Call in prescription for 12 with 4  refills

## 2014-09-28 NOTE — Telephone Encounter (Signed)
Your note at her Dec 2015 CE said "She is taking vitamin D 50,000 units every other week."

## 2014-09-29 ENCOUNTER — Other Ambulatory Visit: Payer: Self-pay | Admitting: Gynecology

## 2014-09-29 ENCOUNTER — Encounter: Payer: Self-pay | Admitting: Family Medicine

## 2014-09-29 DIAGNOSIS — E559 Vitamin D deficiency, unspecified: Secondary | ICD-10-CM

## 2014-09-29 NOTE — Telephone Encounter (Signed)
Pt will come on 10/29/14 @ 4:00pm to have lab level recheck.

## 2014-10-10 ENCOUNTER — Ambulatory Visit (INDEPENDENT_AMBULATORY_CARE_PROVIDER_SITE_OTHER): Payer: Medicare Other | Admitting: Family Medicine

## 2014-10-10 VITALS — BP 114/80 | HR 78 | Temp 98.0°F | Resp 18 | Ht 66.25 in | Wt 130.4 lb

## 2014-10-10 DIAGNOSIS — E559 Vitamin D deficiency, unspecified: Secondary | ICD-10-CM

## 2014-10-10 DIAGNOSIS — J069 Acute upper respiratory infection, unspecified: Secondary | ICD-10-CM

## 2014-10-10 MED ORDER — VITAMIN D (ERGOCALCIFEROL) 1.25 MG (50000 UNIT) PO CAPS: 50000.0000 [IU] | ORAL_CAPSULE | ORAL | Status: DC

## 2014-10-10 NOTE — Progress Notes (Signed)
Patient ID: Ruth Walker, female   DOB: 1946/03/04, 69 y.o.   MRN: [id number]   This chart was scribed for [name], MD by Dellis Filbert, ED Scribe at [facility].The patient was seen in exam room 11 and the patient's care was started at 9:14 AM.  Patient ID: Ruth Walker MRN: [id number], DOB: [birth year], 69 y.o. Date of Encounter: 10/10/2014, 9:14 AM  Primary Physician: [name], MD  Chief Complaint:  Chief Complaint  Patient presents with  . Sore Throat    x2 days  . Otalgia    left ear feels stuffy.    HPI:  Ruth Walker is a 69 y.o. female with a history of allergies who presents to [facility] complaining of new sore throat, acute onset 2 days. She has left ear pain and drainage as associated symptoms. When water gets in her ear it causes pain. Pt has had sick contacts. She has been watching her grandchildren who have had similar symptoms. Pt wants a referral to a new gynecologist.   Past Medical History  Diagnosis Date  . Hemorrhoids   . Irritable bowel syndrome (IBS)   . Asthma     Gets allergy shots once a week   . Skin cancer     Leg  . Cholelithiasis   . Diverticulosis   . Anal fissure   . Anxiety states   . Uterine polyp   . Osteoporosis   . Vertebral compression fracture   . Environmental allergies   . Allergy   . Depression     Home Meds: Prior to Admission medications   Medication Sig Start Date End Date Taking? Authorizing Provider  aspirin 81 MG tablet Take 81 mg by mouth daily.     Yes Historical Provider, MD  benzonatate (TESSALON) 100 MG capsule Take 1-2 capsules (100-200 mg total) by mouth 3 (three) times daily as needed. 09/23/14  Yes Posey Boyer, MD  Calcium Carbonate (CALCIUM 600 PO) Take 1 tablet by mouth 2 (two) times daily.   Yes Historical Provider, MD  cycloSPORINE (RESTASIS) 0.05 % ophthalmic emulsion 1 drop 2 (two) times daily.     Yes Historical Provider, MD  estradiol (ESTRACE)  0.1 MG/GM vaginal cream Place 2.87 Applicatorfuls vaginally 2 (two) times a week. 06/29/14  Yes Terrance Mass, MD  fluocinonide-emollient (LIDEX-E) 0.05 % cream Apply 1 application topically 2 (two) times daily. For heel 03/29/14  Yes [name], MD  hydrochlorothiazide (HYDRODIURIL) 25 MG tablet take 1 tablet by mouth once daily 05/08/14  Yes [name], MD  metoprolol tartrate (LOPRESSOR) 25 MG tablet take 1 tablet by mouth twice a day 04/02/14  Yes [name], MD  polyethylene glycol (MIRALAX / GLYCOLAX) packet Take 17 g by mouth daily. dissolve in 8 oz. Of water at bedtime    Yes Historical Provider, MD  PRESCRIPTION MEDICATION Allergy injection once a week.[facility] [employer] [employer]   Yes Historical Provider, MD  Probiotic Product (ALIGN) 4 MG CAPS Take 1 capsule by mouth daily. 07/05/11  Yes Sable Feil, MD  triamcinolone cream (KENALOG) 0.1 % Apply 1 application topically 2 (two) times daily. 06/10/14  Yes [name], MD  Vitamin D, Ergocalciferol, (DRISDOL) 50000 UNITS CAPS capsule take 1 capsule by mouth every week 09/29/14  Yes Terrance Mass, MD  Wheat Dextrin (BENEFIBER DRINK MIX PO) Take 10 mLs by mouth every morning. Take 2 teaspoons every morning     Yes Historical Provider,  MD  fluticasone (FLONASE) 50 MCG/ACT nasal spray Place 2 sprays into both nostrils daily. Patient not taking: Reported on 10/10/2014 09/23/14   Posey Boyer, MD   Allergies:  Allergies  Allergen Reactions  . Aloe   . Benadryl [Diphenhydramine Hcl] Other (See Comments)    Makes her feel "hyper"  . Cephalexin     REACTION: coarse rash  . Ciprofloxacin   . Mucinex [Guaifenesin Er] Other (See Comments)    Makes her feel worse  . Shellfish Allergy Hives  . Sudafed [Pseudoephedrine Hcl] Hives and Other (See Comments)    Keeps her awake  . Sulfonamide Derivatives    History   Social History  . Marital Status: Married    Spouse Name: N/A  . Number of Children: 2  . Years  of Education: N/A   Occupational History  . Retired    Social History Main Topics  . Smoking status: Never Smoker   . Smokeless tobacco: Never Used  . Alcohol Use: No  . Drug Use: No  . Sexual Activity: Yes    Birth Control/ Protection: Post-menopausal     Comment: number of sex partners in the last 75 months  1   Other Topics Concern  . Not on file   Social History Narrative   Patient gets regular exercise    Review of Systems: Constitutional: negative for chills, fever, night sweats, weight changes, or fatigue  HEENT: negative for vision changes, hearing loss, congestion, rhinorrhea, ST, epistaxis, or sinus pressure. Positive for sore throat, left ear pain. Cardiovascular: negative for chest pain or palpitations Respiratory: negative for hemoptysis, wheezing, shortness of breath, or cough Abdominal: negative for abdominal pain, nausea, vomiting, diarrhea, or constipation Dermatological: negative for rash Neurologic: negative for headache, dizziness, or syncope All other systems reviewed and are otherwise negative with the exception to those above and in the HPI.  Physical Exam: Blood pressure 114/80, pulse 78, temperature 98 F (36.7 C), temperature source Oral, resp. rate 18, height 5' 6.25" (1.683 m), weight 130 lb 6.4 oz (59.149 kg), SpO2 98 %., Body mass index is 20.88 kg/(m^2). General: Well developed, well nourished, in no acute distress. Head: Normocephalic, atraumatic, eyes without discharge, sclera non-icteric, nares are without discharge. Bilateral auditory canals clear, TM's are without perforation, pearly grey and translucent with reflective cone of light bilaterally. Oral cavity moist, posterior pharynx without exudate, erythema, peritonsillar abscess, or post nasal drip.  Neck: Supple. No thyromegaly. Full ROM. No lymphadenopathy. Lungs: Clear bilaterally to auscultation without wheezes, rales, or rhonchi. Breathing is unlabored. Heart: RRR with S1 S2. No murmurs,  rubs, or gallops appreciated. Abdomen: Soft, non-tender, non-distended with normoactive bowel sounds. No hepatomegaly. No rebound/guarding. No obvious abdominal masses. Msk:  Strength and tone normal for age. Extremities/Skin: Warm and dry. No clubbing or cyanosis. No edema. No rashes or suspicious lesions. Neuro: Alert and oriented X 3. Moves all extremities spontaneously. Gait is normal. CNII-XII grossly in tact. Psych:  Responds to questions appropriately with a normal affect.    ASSESSMENT AND PLAN:  69 y.o. year old female with  This chart was scribed in my presence and reviewed by me personally.    ICD-9-CM ICD-10-CM   1. Acute URI 465.9 J06.9   2. Vitamin D deficiency 268.9 E55.9 Vitamin D, Ergocalciferol, (DRISDOL) 50000 UNITS CAPS capsule   This chart was scribed in my presence and reviewed by me personally.    ICD-9-CM ICD-10-CM   1. Acute URI 465.9 J06.9   2. Vitamin D  deficiency 268.9 E55.9 Vitamin D, Ergocalciferol, (DRISDOL) 50000 UNITS CAPS capsule     Signed, [name], MD   Signed, [name], MD 10/10/2014 9:14 AM

## 2014-10-13 ENCOUNTER — Encounter: Payer: Self-pay | Admitting: *Deleted

## 2014-10-29 ENCOUNTER — Other Ambulatory Visit: Payer: Medicare Other

## 2014-11-26 ENCOUNTER — Ambulatory Visit (INDEPENDENT_AMBULATORY_CARE_PROVIDER_SITE_OTHER): Payer: Medicare Other | Admitting: Physician Assistant

## 2014-11-26 ENCOUNTER — Telehealth: Payer: Self-pay | Admitting: Physician Assistant

## 2014-11-26 VITALS — BP 142/80 | HR 82 | Temp 98.0°F | Resp 18 | Ht 66.5 in | Wt 129.0 lb

## 2014-11-26 DIAGNOSIS — J029 Acute pharyngitis, unspecified: Secondary | ICD-10-CM

## 2014-11-26 LAB — POCT RAPID STREP A (OFFICE): Rapid Strep A Screen: NEGATIVE

## 2014-11-26 NOTE — Telephone Encounter (Signed)
Spoke with patient-gave her message

## 2014-11-26 NOTE — Telephone Encounter (Signed)
Please call this patient. Rapid strep test was NEGATIVE, as expected. I'll let her know the results of the culture when it's available. She can use fluids, NSAIDS and Mucinex, along with rest.

## 2014-11-26 NOTE — Progress Notes (Signed)
Patient ID: Ruth Walker, female    DOB: [birth year], 69 y.o.   MRN: [id number]  PCP: [name], MD  Subjective:   Chief Complaint  Patient presents with  . Sore Throat    x1 week  . Nasal Congestion     HPI Presents for evaluation of sore throat x 1 week. She has lots of drainage in the back of her throat, which is common for her, and usually causes a sore throat, but she was exposed to strep about 2 weeks ago and wants to make sure she doesn't have it. She is very involved in the care of her grandchildren, all of whom are well, but doesn't want to expose them.  No fever, chills, nausea/vomiting. No headache or dizziness. No cough.    Review of Systems As above.    Patient Active Problem List   Diagnosis Date Noted  . External hemorrhoids 03/04/2012  . DVT (deep venous thrombosis)   . Uterine polyp   . Osteoporosis   . Vertebral compression fracture   . IBS (irritable bowel syndrome) 09/05/2011  . Constipation, slow transit 09/05/2011  . Anxiety disorder 09/05/2011  . Diverticulosis 11/04/2010  . Gallstones 11/04/2010  . ANAL FISSURE 05/26/2008  . OBSESSIVE-COMPULSIVE PERSONALITY DISORDER 02/28/2008  . HYPERTENSION 02/28/2008  . HEMORRHOIDS 02/28/2008  . ASTHMA 02/28/2008  . CYSTITIS, CHRONIC 02/28/2008     Prior to Admission medications   Medication Sig Start Date End Date Taking? Authorizing Provider  aspirin 81 MG tablet Take 81 mg by mouth daily.     Yes Historical Provider, MD  Calcium Carbonate (CALCIUM 600 PO) Take 1 tablet by mouth 2 (two) times daily.   Yes Historical Provider, MD  cycloSPORINE (RESTASIS) 0.05 % ophthalmic emulsion 1 drop 2 (two) times daily.     Yes Historical Provider, MD  estradiol (ESTRACE) 0.1 MG/GM vaginal cream Place 8.93 Applicatorfuls vaginally 2 (two) times a week. 06/29/14  Yes Terrance Mass, MD  fluticasone (FLONASE) 50 MCG/ACT nasal spray Place 2 sprays into both nostrils daily. 09/23/14  Yes Posey Boyer, MD    hydrochlorothiazide (HYDRODIURIL) 25 MG tablet take 1 tablet by mouth once daily 05/08/14  Yes [name], MD  metoprolol tartrate (LOPRESSOR) 25 MG tablet take 1 tablet by mouth twice a day 04/02/14  Yes [name], MD  polyethylene glycol (MIRALAX / GLYCOLAX) packet Take 17 g by mouth daily. dissolve in 8 oz. Of water at bedtime    Yes Historical Provider, MD  PRESCRIPTION MEDICATION Allergy injection once a week.[facility]   Yes Historical Provider, MD  Probiotic Product (ALIGN) 4 MG CAPS Take 1 capsule by mouth daily. 07/05/11  Yes Sable Feil, MD  Vitamin D, Ergocalciferol, (DRISDOL) 50000 UNITS CAPS capsule Take 1 capsule (50,000 Units total) by mouth once a week. 10/10/14  Yes [name], MD  Wheat Dextrin (BENEFIBER DRINK MIX PO) Take 10 mLs by mouth every morning. Take 2 teaspoons every morning     Yes Historical Provider, MD     Allergies  Allergen Reactions  . Aloe   . Benadryl [Diphenhydramine Hcl] Other (See Comments)    Makes her feel "hyper"  . Cephalexin     REACTION: coarse rash  . Ciprofloxacin   . Mucinex [Guaifenesin Er] Other (See Comments)    Makes her feel worse  . Shellfish Allergy Hives  . Sudafed [Pseudoephedrine Hcl] Hives and Other (See Comments)    Keeps her awake  . Sulfonamide Derivatives  Objective:  Physical Exam  Constitutional: She is oriented to person, place, and time. She appears well-developed and well-nourished. No distress.  BP 142/80 mmHg  Pulse 82  Temp(Src) 98 F (36.7 C) (Oral)  Resp 18  Ht 5' 6.5" (1.689 m)  Wt 129 lb (58.514 kg)  BMI 20.51 kg/m2  SpO2 98%   HENT:  Head: Normocephalic and atraumatic.  Right Ear: Hearing, tympanic membrane, external ear and ear canal normal.  Left Ear: Hearing, tympanic membrane, external ear and ear canal normal.  Nose: Nose normal.  Mouth/Throat: Uvula is midline, oropharynx is clear and moist and mucous membranes are normal. Normal dentition.  No oropharyngeal exudate, posterior oropharyngeal edema or posterior oropharyngeal erythema.  Eyes: Conjunctivae are normal. No scleral icterus.  Neck: Trachea normal and normal range of motion. Neck supple. No spinous process tenderness and no muscular tenderness present. No thyroid mass and no thyromegaly present.  Cardiovascular: Normal rate, regular rhythm, normal heart sounds and intact distal pulses.   Pulmonary/Chest: Effort normal and breath sounds normal.  Lymphadenopathy:       Head (right side): No submental, no submandibular and no tonsillar adenopathy present.       Head (left side): No submental, no submandibular and no tonsillar adenopathy present.    She has no cervical adenopathy.       Right: No supraclavicular adenopathy present.       Left: No supraclavicular adenopathy present.  Neurological: She is alert and oriented to person, place, and time.  Skin: Skin is warm and dry.  Psychiatric: She has a normal mood and affect. Her behavior is normal.       Results for orders placed or performed in visit on 11/26/14  POCT rapid strep A  Result Value Ref Range   Rapid Strep A Screen Negative Negative       Assessment & Plan:   1. Sore throat Likely due to post-nasal drainage due to allergic or viral process. Anticipatory guidance. Supportive care.  - POCT rapid strep A - Throat culture ([facility])   Fara Chute, PA-C Physician Assistant-Certified [facility] .

## 2014-11-28 LAB — CULTURE, GROUP A STREP: ORGANISM ID, BACTERIA: NORMAL

## 2014-12-26 ENCOUNTER — Ambulatory Visit (INDEPENDENT_AMBULATORY_CARE_PROVIDER_SITE_OTHER): Payer: Medicare Other | Admitting: Family Medicine

## 2014-12-26 VITALS — BP 130/80 | HR 79 | Temp 98.8°F | Resp 16 | Ht 66.5 in | Wt 130.0 lb

## 2014-12-26 DIAGNOSIS — I8390 Asymptomatic varicose veins of unspecified lower extremity: Secondary | ICD-10-CM

## 2014-12-26 DIAGNOSIS — I868 Varicose veins of other specified sites: Secondary | ICD-10-CM | POA: Diagnosis not present

## 2014-12-26 DIAGNOSIS — J301 Allergic rhinitis due to pollen: Secondary | ICD-10-CM

## 2014-12-26 DIAGNOSIS — B079 Viral wart, unspecified: Secondary | ICD-10-CM

## 2014-12-26 DIAGNOSIS — S9031XA Contusion of right foot, initial encounter: Secondary | ICD-10-CM | POA: Diagnosis not present

## 2014-12-26 DIAGNOSIS — M79671 Pain in right foot: Secondary | ICD-10-CM | POA: Diagnosis not present

## 2014-12-26 NOTE — Patient Instructions (Signed)
1. Recommend Claritin Reditab 5mg  daily for allergies and drainage. 2.  Recommend restarting Flonase once daily for the next two weeks. 3. Call if drainage worsens. 4.  Start icing R foot twice daily for 15-20 minutes.  Do not apply strap to R foot for the next two weeks.

## 2014-12-26 NOTE — Progress Notes (Signed)
Subjective:    Patient ID: Ruth Walker, female    DOB: Nov 13, 1945, 69 y.o.   MRN: [id number]  12/26/2014  Sinusitis; other; and Foot Pain   HPI This 69 y.o. female presents for evaluation of several concerns:  1. Sinus congestion:   +PND chronic but has been worsening recently at nighttime; followed by Donneta Romberg.  The other night, snoring.  Takes care of three grandchildren; recommended Claritin.  Causes sedation with Claritin.  Interested in baby dose Claritin.  No fever/chills/sweats.  No headache; no sinus pressure. No ear pain or sore throat.  Now worried going into chest.    2. Wart:  Granddaughter with warts; pt has one on finger R second finger.  At last visit, Chelle squeezed pimple and now very tender and actually now presumed pimple looks like a wart.    3.  Foot pain:  Now exercising; has sciatica; since breaking back several years ago; had surgery on cervical spine.  Leans on R side.  +Varicose veins with chronic R foot swelling that is mild.  Bicycling stationary two weeks ago; pull distal to arch and in feet; putting strap on at bike.  R foot is bigger than L foot.  Does not have pain with weight bearing always; has intermittent pain; pain improves with walking without shoes.  No warmth along varicosities.  Does not interfere with activity; worried about etiology.     Review of Systems  Constitutional: Negative for fever, chills, diaphoresis and fatigue.  HENT: Positive for congestion and postnasal drip. Negative for ear discharge, ear pain, facial swelling, hearing loss, rhinorrhea, sinus pressure, sneezing, sore throat, trouble swallowing and voice change.   Respiratory: Positive for cough. Negative for shortness of breath.   Cardiovascular: Positive for leg swelling.  Gastrointestinal: Negative for nausea, vomiting and diarrhea.  Musculoskeletal: Positive for joint swelling and arthralgias. Negative for gait problem.  Skin: Positive for color change.  Neurological:  Negative for weakness and numbness.    Past Medical History  Diagnosis Date  . Hemorrhoids   . Irritable bowel syndrome (IBS)   . Asthma     Gets allergy shots once a week   . Skin cancer     Leg  . Cholelithiasis   . Diverticulosis   . Anal fissure   . Anxiety states   . Uterine polyp   . Osteoporosis   . Vertebral compression fracture   . Environmental allergies   . Allergy   . Depression    Past Surgical History  Procedure Laterality Date  . Cesarean section    . Hemorrhoid surgery    . Back surgery    . Skin cancer excision      Leg   . Endometrial polypectomy    . Hysteroscopy    . Dilation and curettage of uterus    . Mouth surgery    . Fracture surgery      neck (vertebrae)  . Ibs     Allergies  Allergen Reactions  . Aloe   . Benadryl [Diphenhydramine Hcl] Other (See Comments)    Makes her feel "hyper"  . Cephalexin     REACTION: coarse rash  . Ciprofloxacin   . Mucinex [Guaifenesin Er] Other (See Comments)    Makes her feel worse  . Shellfish Allergy Hives  . Sudafed [Pseudoephedrine Hcl] Hives and Other (See Comments)    Keeps her awake  . Sulfonamide Derivatives    Current Outpatient Prescriptions  Medication Sig Dispense Refill  . aspirin  81 MG tablet Take 81 mg by mouth daily.      . Calcium Carbonate (CALCIUM 600 PO) Take 1 tablet by mouth 2 (two) times daily.    . cycloSPORINE (RESTASIS) 0.05 % ophthalmic emulsion 1 drop 2 (two) times daily.      Marland Kitchen estradiol (ESTRACE) 0.1 MG/GM vaginal cream Place 2.80 Applicatorfuls vaginally 2 (two) times a week. 42.5 g 8  . fluticasone (FLONASE) 50 MCG/ACT nasal spray Place 2 sprays into both nostrils daily. 16 g 6  . hydrochlorothiazide (HYDRODIURIL) 25 MG tablet take 1 tablet by mouth once daily 90 tablet 3  . metoprolol tartrate (LOPRESSOR) 25 MG tablet take 1 tablet by mouth twice a day 60 tablet 11  . polyethylene glycol (MIRALAX / GLYCOLAX) packet Take 17 g by mouth daily. dissolve in 8 oz. Of water  at bedtime     . PRESCRIPTION MEDICATION Allergy injection once a week.[facility] [employer] [employer] [facility]    . Probiotic Product (ALIGN) 4 MG CAPS Take 1 capsule by mouth daily. 21 capsule 0  . Vitamin D, Ergocalciferol, (DRISDOL) 50000 UNITS CAPS capsule Take 1 capsule (50,000 Units total) by mouth once a week. 15 capsule 3  . Wheat Dextrin (BENEFIBER DRINK MIX PO) Take 10 mLs by mouth every morning. Take 2 teaspoons every morning      . [DISCONTINUED] Linaclotide (LINZESS) 145 MCG CAPS Take 1 capsule by mouth daily. 8 capsule 0   No current facility-administered medications for this visit.   History   Social History  . Marital Status: Married    Spouse Name: N/A  . Number of Children: 2  . Years of Education: N/A   Occupational History  . Retired    Social History Main Topics  . Smoking status: Never Smoker   . Smokeless tobacco: Never Used  . Alcohol Use: No  . Drug Use: No  . Sexual Activity: Yes    Birth Control/ Protection: Post-menopausal     Comment: number of sex partners in the last 49 months  1   Other Topics Concern  . Not on file   Social History Narrative   Patient gets regular exercise. Cares for her grandchildren.       Objective:    BP 130/80 mmHg  Pulse 79  Temp(Src) 98.8 F (37.1 C) (Oral)  Resp 16  Ht 5' 6.5" (1.689 m)  Wt 130 lb (58.968 kg)  BMI 20.67 kg/m2  SpO2 97% Physical Exam  Constitutional: She is oriented to person, place, and time. She appears well-developed and well-nourished. No distress.  HENT:  Head: Normocephalic and atraumatic.  Right Ear: Tympanic membrane, external ear and ear canal normal.  Left Ear: Tympanic membrane, external ear and ear canal normal.  Nose: Nose normal.  Mouth/Throat: Oropharynx is clear and moist.  +PND in OP.  Eyes: Conjunctivae and EOM are normal. Pupils are equal, round, and reactive to light.  Neck: Normal range of motion. Neck supple.  Cardiovascular: Normal rate, regular rhythm and normal heart  sounds.  Exam reveals no gallop and no friction rub.   No murmur heard. RLE large varicosity with large prominent vascular bed/varicosities R ankle region with mild swelling of R foot.  +similar vascular bed with prominence L ankle with no edema in leg or foot.  Pulmonary/Chest: Effort normal and breath sounds normal. She has no wheezes. She has no rales.  Musculoskeletal: She exhibits edema.       Right ankle: Normal. She exhibits normal range of motion, no swelling  and no ecchymosis. No tenderness. No lateral malleolus and no medial malleolus tenderness found. Achilles tendon normal.       Right foot: There is tenderness, bony tenderness and swelling. There is normal range of motion and normal capillary refill.       Feet:  R FOOT:  Mild swelling distal foot; vascular bed with prominence R ankle and medial foot; +TTP along 4th metatarsal; negative pain with metatarsal squeeze.  No heel tenderness.  No warmth to foot.  Neurological: She is alert and oriented to person, place, and time.  Skin: She is not diaphoretic. No erythema.  +warty lesion L fourth finger.  R proximal arm with warty like lesion 78mm diameter.  Psychiatric: She has a normal mood and affect. Her behavior is normal.  Nursing note and vitals reviewed.  PROCEDURE:  VERBAL CONSENT OBTAINED.  CRYOTHERAPY X 3 TO R PROXIMAL ARM LESION AND L FOURTH DIGIT WARTY LESION; PT TOLERATED PROCEDURE WELL.     Assessment & Plan:   1. Allergic rhinitis due to pollen   2. Wart   3. Right foot pain   4. Varicose veins   5. Foot contusion, right, initial encounter    1.  Allergic Rhinitis: uncontrolled; start Claritin 5mg  daily; restart Flonase daily. If no improvement in 1-2 weeks, call for abx therapy for secondary sinusitis. 2. Warts: New.  L fourth finger and R proximal arm; s/p cryotherapy; pt tolerated well. 3.  R foot pain/contusion: New.  Suggestive of contusion from strap on stationary bike and rowing machine at gym; no suggestion  of metatarsalgia or stress fracture; only having intermittent pain; recommend icing and elevation of foot; recommend avoiding the use of strap over R foot with exercise for the next two weeks. 4.  Varicose veins BLE: chronic condition for patient; suffering with only mild swelling in RLE; recommend vascular consultation to discuss potential treatment options for patient.   No orders of the defined types were placed in this encounter.    No Follow-up on file.    Kristi Elayne Guerin, M.D. [facility] [address] [place], [place]  [address] [phone] phone [phone] fax

## 2014-12-27 ENCOUNTER — Ambulatory Visit (INDEPENDENT_AMBULATORY_CARE_PROVIDER_SITE_OTHER): Payer: Medicare Other | Admitting: Family Medicine

## 2014-12-27 VITALS — BP 138/82 | HR 102 | Temp 98.7°F | Resp 12 | Ht 66.0 in | Wt 130.0 lb

## 2014-12-27 DIAGNOSIS — F418 Other specified anxiety disorders: Secondary | ICD-10-CM

## 2014-12-27 DIAGNOSIS — J301 Allergic rhinitis due to pollen: Secondary | ICD-10-CM | POA: Diagnosis not present

## 2014-12-27 DIAGNOSIS — J22 Unspecified acute lower respiratory infection: Secondary | ICD-10-CM

## 2014-12-27 DIAGNOSIS — G4700 Insomnia, unspecified: Secondary | ICD-10-CM | POA: Diagnosis not present

## 2014-12-27 DIAGNOSIS — F329 Major depressive disorder, single episode, unspecified: Secondary | ICD-10-CM

## 2014-12-27 DIAGNOSIS — J0101 Acute recurrent maxillary sinusitis: Secondary | ICD-10-CM | POA: Diagnosis not present

## 2014-12-27 DIAGNOSIS — F419 Anxiety disorder, unspecified: Secondary | ICD-10-CM

## 2014-12-27 DIAGNOSIS — J988 Other specified respiratory disorders: Secondary | ICD-10-CM

## 2014-12-27 MED ORDER — TRAZODONE HCL 50 MG PO TABS: 25.0000 mg | ORAL_TABLET | Freq: Every evening | ORAL | Status: DC | PRN

## 2014-12-27 MED ORDER — AMOXICILLIN-POT CLAVULANATE 875-125 MG PO TABS: 1.0000 | ORAL_TABLET | Freq: Two times a day (BID) | ORAL | Status: DC

## 2014-12-27 MED ORDER — METHYLPREDNISOLONE ACETATE 80 MG/ML IJ SUSP
80.0000 mg | Freq: Once | INTRAMUSCULAR | Status: AC
  Administered 2014-12-27: 80 mg via INTRAMUSCULAR
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
## 2015-01-03 ENCOUNTER — Ambulatory Visit (INDEPENDENT_AMBULATORY_CARE_PROVIDER_SITE_OTHER): Payer: Medicare Other | Admitting: Family Medicine

## 2015-01-03 VITALS — BP 118/70 | HR 89 | Temp 98.8°F | Resp 18 | Ht 66.0 in | Wt 130.4 lb

## 2015-01-03 DIAGNOSIS — F4321 Adjustment disorder with depressed mood: Secondary | ICD-10-CM

## 2015-01-03 DIAGNOSIS — J40 Bronchitis, not specified as acute or chronic: Secondary | ICD-10-CM | POA: Diagnosis not present

## 2015-01-03 MED ORDER — FLUOXETINE HCL 20 MG PO TABS: 20.0000 mg | ORAL_TABLET | Freq: Every day | ORAL | Status: DC

## 2015-01-03 NOTE — Patient Instructions (Addendum)
Continue taking the antibiotic as directed until gone.   I think that you will continue to improve as your body gets through this infection Let us know if you are getting worse or not getting better in the next few days let me know  Try some mucinex to help you bring up the mucus  You can try taking the prozac once a day for your symptoms of anxiety and depression.   Let us know if this is not helping you- let's plan to recheck in 1-2 months.

## 2015-01-03 NOTE — Progress Notes (Signed)
[facility] [address] [place] [address] [phone]  Date:  01/03/2015   Name:  Ruth Walker   DOB:  June 17, 1946   MRN:  [id number]  PCP:  [name], MD    Chief Complaint: Follow-up   History of Present Illness:  Ruth Walker is a 69 y.o. very pleasant female patient who presents with the following:  She was seen here by Dr. Tamala Julian about one week ago with complaint of sinusitis sx.  Treated with augmentin and depo-medrol IM 80mg  once.  She is here today with complaint of some continued sx She reports that she feels better overall. She is 3/4 done with her abx.  She has not used mucinex.   She is bringing up some clear mucus. She has not noted a fever She had an allergy shot last week Her cough is about the same- she feels like she needs to cough to bring mucus up from her throat.   Her appetite is improved now that her taste sensation is better.    She also discussed her depression and marital problems with Dr. Tamala Julian at her most recent visit.  She states that she has thought about it and would like to try medication for depression.  Denies any SI- "I have too much to live for," but she would like to see if she can feel better overall.  She is on trazodone for sleep which is working well for her Patient Active Problem List   Diagnosis Date Noted  . External hemorrhoids 03/04/2012  . DVT (deep venous thrombosis)   . Uterine polyp   . Osteoporosis   . Vertebral compression fracture   . IBS (irritable bowel syndrome) 09/05/2011  . Constipation, slow transit 09/05/2011  . Anxiety disorder 09/05/2011  . Diverticulosis 11/04/2010  . Gallstones 11/04/2010  . ANAL FISSURE 05/26/2008  . OBSESSIVE-COMPULSIVE PERSONALITY DISORDER 02/28/2008  . HYPERTENSION 02/28/2008  . HEMORRHOIDS 02/28/2008  . ASTHMA 02/28/2008  . CYSTITIS, CHRONIC 02/28/2008    Past Medical History  Diagnosis Date  . Hemorrhoids   . Irritable bowel syndrome (IBS)   .  Asthma     Gets allergy shots once a week   . Skin cancer     Leg  . Cholelithiasis   . Diverticulosis   . Anal fissure   . Anxiety states   . Uterine polyp   . Osteoporosis   . Vertebral compression fracture   . Environmental allergies   . Allergy   . Depression     Past Surgical History  Procedure Laterality Date  . Cesarean section    . Hemorrhoid surgery    . Back surgery    . Skin cancer excision      Leg   . Endometrial polypectomy    . Hysteroscopy    . Dilation and curettage of uterus    . Mouth surgery    . Fracture surgery      neck (vertebrae)  . Ibs      History  Substance Use Topics  . Smoking status: Never Smoker   . Smokeless tobacco: Never Used  . Alcohol Use: No    Family History  Problem Relation Age of Onset  . Diabetes Mother   . Hypertension Mother   . Thyroid disease Mother   . Colon cancer Neg Hx   . Hypertension Father   . Heart disease Father   . Thyroid disease Sister     Allergies  Allergen Reactions  .  Aloe   . Benadryl [Diphenhydramine Hcl] Other (See Comments)    Makes her feel "hyper"  . Cephalexin     REACTION: coarse rash  . Ciprofloxacin   . Mucinex [Guaifenesin Er] Other (See Comments)    Makes her feel worse  . Shellfish Allergy Hives  . Sudafed [Pseudoephedrine Hcl] Hives and Other (See Comments)    Keeps her awake  . Sulfonamide Derivatives     Medication list has been reviewed and updated.  Current Outpatient Prescriptions on File Prior to Visit  Medication Sig Dispense Refill  . amoxicillin-clavulanate (AUGMENTIN) 875-125 MG per tablet Take 1 tablet by mouth 2 (two) times daily. 20 tablet 0  . aspirin 81 MG tablet Take 81 mg by mouth daily.      . Calcium Carbonate (CALCIUM 600 PO) Take 1 tablet by mouth 2 (two) times daily.    . cycloSPORINE (RESTASIS) 0.05 % ophthalmic emulsion 1 drop 2 (two) times daily.      Marland Kitchen estradiol (ESTRACE) 0.1 MG/GM vaginal cream Place 1.54 Applicatorfuls vaginally 2 (two)  times a week. 42.5 g 8  . hydrochlorothiazide (HYDRODIURIL) 25 MG tablet take 1 tablet by mouth once daily 90 tablet 3  . metoprolol tartrate (LOPRESSOR) 25 MG tablet take 1 tablet by mouth twice a day 60 tablet 11  . polyethylene glycol (MIRALAX / GLYCOLAX) packet Take 17 g by mouth daily. dissolve in 8 oz. Of water at bedtime     . PRESCRIPTION MEDICATION Allergy injection once a week.[facility] [employer] [employer] [facility]    . Probiotic Product (ALIGN) 4 MG CAPS Take 1 capsule by mouth daily. 21 capsule 0  . traZODone (DESYREL) 50 MG tablet Take 0.5-1 tablets (25-50 mg total) by mouth at bedtime as needed for sleep. 30 tablet 3  . Vitamin D, Ergocalciferol, (DRISDOL) 50000 UNITS CAPS capsule Take 1 capsule (50,000 Units total) by mouth once a week. 15 capsule 3  . Wheat Dextrin (BENEFIBER DRINK MIX PO) Take 10 mLs by mouth every morning. Take 2 teaspoons every morning      . fluticasone (FLONASE) 50 MCG/ACT nasal spray Place 2 sprays into both nostrils daily. (Patient not taking: Reported on 12/27/2014) 16 g 6  . [DISCONTINUED] Linaclotide (LINZESS) 145 MCG CAPS Take 1 capsule by mouth daily. 8 capsule 0   No current facility-administered medications on file prior to visit.    Review of Systems:  As per HPI- otherwise negative.   Physical Examination: Filed Vitals:   01/03/15 1152  BP: 118/70  Pulse: 89  Temp: 98.8 F (37.1 C)  Resp: 18   Filed Vitals:   01/03/15 1152  Height: 5\' 6"  (1.676 m)  Weight: 130 lb 6.4 oz (59.149 kg)   Body mass index is 21.06 kg/(m^2). Ideal Body Weight: Weight in (lb) to have BMI = 25: 154.6  GEN: WDWN, NAD, Non-toxic, A & O x 3, looks well HEENT: Atraumatic, Normocephalic. Neck supple. No masses, No LAD.  Bilateral TM wnl, oropharynx normal.  PEERL,EOMI.  Nasal cavity normal Ears and Nose: No external deformity. CV: RRR, No M/G/R. No JVD. No thrill. No extra heart sounds. PULM: CTA B, no wheezes, crackles, rhonchi. No retractions. No resp. distress.  No accessory muscle use.  Lungs are clear EXTR: No c/c/e NEURO Normal gait.  PSYCH: Normally interactive. Conversant. Not depressed or anxious appearing.  Calm demeanor.    Assessment and Plan: Bronchitis  Adjustment disorder with depressed mood - Plan: FLUoxetine (PROZAC) 20 MG tablet  She seems to be  getting better as far as her illness- reassured that nothing further is needed at this time.  Encouraged her to continue her course of abx, mucinex as needed. She will let me know if not continuing to improve After consideration she would like to start prozac.  Will have her start at 20 mg a day- she will report if any problems, OW plan to recheck in 1-2 months.  Continue trazodone which is helping with sleep  Signed Lamar Blinks, MD

## 2015-02-24 ENCOUNTER — Ambulatory Visit (INDEPENDENT_AMBULATORY_CARE_PROVIDER_SITE_OTHER): Payer: Medicare Other | Admitting: Emergency Medicine

## 2015-02-24 VITALS — BP 160/90 | HR 76 | Temp 98.0°F | Resp 18 | Ht 66.5 in | Wt 129.0 lb

## 2015-02-24 DIAGNOSIS — R0981 Nasal congestion: Secondary | ICD-10-CM

## 2015-02-24 DIAGNOSIS — J302 Other seasonal allergic rhinitis: Secondary | ICD-10-CM

## 2015-02-24 DIAGNOSIS — L309 Dermatitis, unspecified: Secondary | ICD-10-CM

## 2015-02-24 DIAGNOSIS — I10 Essential (primary) hypertension: Secondary | ICD-10-CM

## 2015-02-24 DIAGNOSIS — E559 Vitamin D deficiency, unspecified: Secondary | ICD-10-CM

## 2015-02-24 MED ORDER — ESTRADIOL 0.1 MG/GM VA CREA: 2.0000 g | TOPICAL_CREAM | VAGINAL | Status: DC

## 2015-02-24 MED ORDER — FLUTICASONE PROPIONATE 50 MCG/ACT NA SUSP: 2.0000 | Freq: Every day | NASAL | Status: DC

## 2015-02-24 MED ORDER — TRAZODONE HCL 50 MG PO TABS: 25.0000 mg | ORAL_TABLET | Freq: Every evening | ORAL | Status: DC | PRN

## 2015-02-24 MED ORDER — HYDROCHLOROTHIAZIDE 25 MG PO TABS: ORAL_TABLET | ORAL | Status: DC

## 2015-02-24 MED ORDER — METOPROLOL TARTRATE 25 MG PO TABS: 25.0000 mg | ORAL_TABLET | Freq: Two times a day (BID) | ORAL | Status: DC

## 2015-02-24 MED ORDER — VITAMIN D (ERGOCALCIFEROL) 1.25 MG (50000 UNIT) PO CAPS: 50000.0000 [IU] | ORAL_CAPSULE | ORAL | Status: DC

## 2015-02-24 NOTE — Progress Notes (Signed)
Subjective:  Patient ID: Ruth Walker, female    DOB: 1945-07-31  Age: 69 y.o. MRN: [id number]  CC: Rash; Follow-up; and Allergies   HPI Ruth Walker presents   With numerous complaints. First she s concerned that she is not been retested for hypovitaminosis D. She's been treated  Sub-therapeutically with vitamin D replacement. And now she is looking for a new gynecologist. She also has nasal congestion postnasal drainage watery in color. She has no fever chills cough or sore throat. She's not been taking allergy medication as prescribed. Finally she has some issue with a rash on her left breast. She said she has thickened area in the inframammary crease on the left. That is rather smelly. She has no discharge. No tenderness or mass  History Ruth Walker has a past medical history of Hemorrhoids; Irritable bowel syndrome (IBS); Asthma; Skin cancer; Cholelithiasis; Diverticulosis; Anal fissure; Anxiety states; Uterine polyp; Osteoporosis; Vertebral compression fracture; Environmental allergies; Allergy; and Depression.   She has past surgical history that includes Cesarean section; Hemorrhoid surgery; Back surgery; Skin cancer excision; Endometrial polypectomy; Hysteroscopy; Dilation and curettage of uterus; Mouth surgery; Fracture surgery; and IBS.   Her  family history includes Diabetes in her mother; Heart disease in her father; Hypertension in her father and mother; Thyroid disease in her mother and sister. There is no history of Colon cancer.  She   reports that she has never smoked. She has never used smokeless tobacco. She reports that she does not drink alcohol or use illicit drugs.  Outpatient Prescriptions Prior to Visit  Medication Sig Dispense Refill  . aspirin 81 MG tablet Take 81 mg by mouth daily.      . cycloSPORINE (RESTASIS) 0.05 % ophthalmic emulsion 1 drop 2 (two) times daily.      . polyethylene glycol (MIRALAX / GLYCOLAX) packet Take 17 g by mouth daily. dissolve in 8  oz. Of water at bedtime     . PRESCRIPTION MEDICATION Allergy injection once a week.[facility] [employer]    . Probiotic Product (ALIGN) 4 MG CAPS Take 1 capsule by mouth daily. 21 capsule 0  . Wheat Dextrin (BENEFIBER DRINK MIX PO) Take 10 mLs by mouth every morning. Take 2 teaspoons every morning      . estradiol (ESTRACE) 0.1 MG/GM vaginal cream Place 7.41 Applicatorfuls vaginally 2 (two) times a week. 42.5 g 8  . fluticasone (FLONASE) 50 MCG/ACT nasal spray Place 2 sprays into both nostrils daily. 16 g 6  . hydrochlorothiazide (HYDRODIURIL) 25 MG tablet take 1 tablet by mouth once daily 90 tablet 3  . metoprolol tartrate (LOPRESSOR) 25 MG tablet take 1 tablet by mouth twice a day 60 tablet 11  . traZODone (DESYREL) 50 MG tablet Take 0.5-1 tablets (25-50 mg total) by mouth at bedtime as needed for sleep. 30 tablet 3  . Vitamin D, Ergocalciferol, (DRISDOL) 50000 UNITS CAPS capsule Take 1 capsule (50,000 Units total) by mouth once a week. 15 capsule 3  . amoxicillin-clavulanate (AUGMENTIN) 875-125 MG per tablet Take 1 tablet by mouth 2 (two) times daily. 20 tablet 0  . Calcium Carbonate (CALCIUM 600 PO) Take 1 tablet by mouth 2 (two) times daily.    Marland Kitchen FLUoxetine (PROZAC) 20 MG tablet Take 1 tablet (20 mg total) by mouth daily. (Patient not taking: Reported on 02/24/2015) 30 tablet 5   No facility-administered medications prior to visit.    Social History   Social History  . Marital Status: Married    Spouse  Name: N/A  . Number of Children: 2  . Years of Education: N/A   Occupational History  . Retired    Social History Main Topics  . Smoking status: Never Smoker   . Smokeless tobacco: Never Used  . Alcohol Use: No  . Drug Use: No  . Sexual Activity: Yes    Birth Control/ Protection: Post-menopausal     Comment: number of sex partners in the last 44 months  1   Other Topics Concern  . None   Social History Narrative   Patient gets regular exercise. Cares for her  grandchildren.     Review of Systems  Constitutional: Negative for fever, chills and appetite change.  HENT: Negative for congestion, ear pain, postnasal drip, sinus pressure and sore throat.   Eyes: Negative for pain and redness.  Respiratory: Negative for cough, shortness of breath and wheezing.   Cardiovascular: Negative for leg swelling.  Gastrointestinal: Negative for nausea, vomiting, abdominal pain, diarrhea, constipation and blood in stool.  Endocrine: Negative for polyuria.  Genitourinary: Negative for dysuria, urgency, frequency and flank pain.  Musculoskeletal: Negative for gait problem.  Skin: Negative for rash.  Neurological: Negative for weakness and headaches.  Psychiatric/Behavioral: Negative for confusion and decreased concentration. The patient is not nervous/anxious.     Objective:  BP 160/90 mmHg  Pulse 76  Temp(Src) 98 F (36.7 C) (Oral)  Resp 18  Ht 5' 6.5" (1.689 m)  Wt 129 lb (58.514 kg)  BMI 20.51 kg/m2  SpO2 98%  Physical Exam  Constitutional: She is oriented to person, place, and time. She appears well-developed and well-nourished.  HENT:  Head: Normocephalic and atraumatic.  Eyes: Conjunctivae are normal. Pupils are equal, round, and reactive to light.  Pulmonary/Chest: Effort normal.  Musculoskeletal: She exhibits no edema.  Neurological: She is alert and oriented to person, place, and time.  Skin: Skin is dry. Lesion noted.  Psychiatric: She has a normal mood and affect. Her behavior is normal. Thought content normal.   she has a ridge of thickened tissue in the inframammary crease on the left breast it's probably 2 inches in ength not tender. There is no mass. Not erythematous or draining.    Assessment & Plan:   Ruth Walker was seen today for rash, follow-up and allergies.  Diagnoses and all orders for this visit:  Hypovitaminosis D -     Vit D  25 hydroxy (rtn osteoporosis monitoring)  Vitamin D deficiency -     Vitamin D,  Ergocalciferol, (DRISDOL) 50000 UNITS CAPS capsule; Take 1 capsule (50,000 Units total) by mouth once a week.  Dermatitis  Seasonal allergic rhinitis -     fluticasone (FLONASE) 50 MCG/ACT nasal spray; Place 2 sprays into both nostrils daily.  Essential hypertension -     hydrochlorothiazide (HYDRODIURIL) 25 MG tablet; take 1 tablet by mouth once daily  Nasal congestion -     fluticasone (FLONASE) 50 MCG/ACT nasal spray; Place 2 sprays into both nostrils daily.  Other orders -     traZODone (DESYREL) 50 MG tablet; Take 0.5-1 tablets (25-50 mg total) by mouth at bedtime as needed for sleep. -     metoprolol tartrate (LOPRESSOR) 25 MG tablet; Take 1 tablet (25 mg total) by mouth 2 (two) times daily. -     estradiol (ESTRACE) 0.1 MG/GM vaginal cream; Place 0.73 Applicatorfuls vaginally 2 (two) times a week.   I have changed Ruth Walker's metoprolol tartrate. I am also having her maintain her aspirin, Wheat Dextrin (BENEFIBER  DRINK MIX PO), polyethylene glycol, cycloSPORINE, ALIGN, PRESCRIPTION MEDICATION, Calcium Carbonate (CALCIUM 600 PO), amoxicillin-clavulanate, FLUoxetine, Vitamin D (Ergocalciferol), traZODone, hydrochlorothiazide, fluticasone, and estradiol.  Meds ordered this encounter  Medications  . Vitamin D, Ergocalciferol, (DRISDOL) 50000 UNITS CAPS capsule    Sig: Take 1 capsule (50,000 Units total) by mouth once a week.    Dispense:  15 capsule    Refill:  3  . traZODone (DESYREL) 50 MG tablet    Sig: Take 0.5-1 tablets (25-50 mg total) by mouth at bedtime as needed for sleep.    Dispense:  30 tablet    Refill:  3  . metoprolol tartrate (LOPRESSOR) 25 MG tablet    Sig: Take 1 tablet (25 mg total) by mouth 2 (two) times daily.    Dispense:  60 tablet    Refill:  11  . hydrochlorothiazide (HYDRODIURIL) 25 MG tablet    Sig: take 1 tablet by mouth once daily    Dispense:  90 tablet    Refill:  3  . fluticasone (FLONASE) 50 MCG/ACT nasal spray    Sig: Place 2 sprays into  both nostrils daily.    Dispense:  16 g    Refill:  12  . estradiol (ESTRACE) 0.1 MG/GM vaginal cream    Sig: Place 1.02 Applicatorfuls vaginally 2 (two) times a week.    Dispense:  42.5 g    Refill:  8    Appropriate red flag conditions were discussed with the patient as well as actions that should be taken.  Patient expressed his understanding.  Follow-up: Return if symptoms worsen or fail to improve.  Roselee Culver, MD

## 2015-02-24 NOTE — Patient Instructions (Signed)

## 2015-02-25 LAB — VITAMIN D 25 HYDROXY (VIT D DEFICIENCY, FRACTURES): Vit D, 25-Hydroxy: 43 ng/mL (ref 30–100)

## 2015-03-25 ENCOUNTER — Telehealth: Payer: Self-pay

## 2015-03-25 DIAGNOSIS — F411 Generalized anxiety disorder: Secondary | ICD-10-CM

## 2015-03-25 MED ORDER — ALPRAZOLAM 0.25 MG PO TABS: 0.2500 mg | ORAL_TABLET | Freq: Two times a day (BID) | ORAL | Status: DC | PRN

## 2015-03-25 NOTE — Telephone Encounter (Signed)
Pt is needing to talk walk with dr Joseph Art about getting put on an anxiety medication -she does not have the money for copay but husband has been ill and she is under a lot of stress   Best number to call [phone] before 3 tomorrow or after 6 tomorrow

## 2015-03-26 ENCOUNTER — Other Ambulatory Visit: Payer: Self-pay | Admitting: Family Medicine

## 2015-03-26 DIAGNOSIS — F429 Obsessive-compulsive disorder, unspecified: Secondary | ICD-10-CM

## 2015-03-26 MED ORDER — PAROXETINE HCL 20 MG PO TABS: 20.0000 mg | ORAL_TABLET | Freq: Every day | ORAL | Status: DC

## 2015-03-26 NOTE — Telephone Encounter (Signed)
Spoke with pt, advised message from Dr. Lauenstein. Pt understood. 

## 2015-03-26 NOTE — Telephone Encounter (Signed)
I have called in Paxil.  Let me see how she is doing in one month

## 2015-03-26 NOTE — Telephone Encounter (Signed)
Called in Rx and notified pt. She thanked [place], but stated that she had specifically asked Butch Penny to put in the message that she would like to try Paxil because it has worked really well for her husband and daughter. I explained how xanax works, as well as the Paxil. Pt will pick up the xanax and just use it as needed and plan to continue the xanax as needed until the Paxil is at therapeutic level. Pt understands that Dr L may need to see her first, but she is just overwhelmed right now with all of the hosp bills. She reported that her OCD (excessive handwashing Sxs) becomes worse as her anxiety increases, so it is worse right now, but she has been dealing with the anxiety and OCD for years. She would like Dr Lenn Cal opinion about whether the Paxil would be best, or just use the xanax?

## 2015-05-07 ENCOUNTER — Ambulatory Visit (INDEPENDENT_AMBULATORY_CARE_PROVIDER_SITE_OTHER): Payer: Medicare Other | Admitting: Emergency Medicine

## 2015-05-07 VITALS — BP 148/92 | HR 82 | Temp 98.3°F | Resp 18 | Ht 66.5 in | Wt 128.0 lb

## 2015-05-07 DIAGNOSIS — F419 Anxiety disorder, unspecified: Principal | ICD-10-CM

## 2015-05-07 DIAGNOSIS — J340 Abscess, furuncle and carbuncle of nose: Secondary | ICD-10-CM

## 2015-05-07 DIAGNOSIS — F329 Major depressive disorder, single episode, unspecified: Secondary | ICD-10-CM

## 2015-05-07 DIAGNOSIS — F418 Other specified anxiety disorders: Secondary | ICD-10-CM

## 2015-05-07 DIAGNOSIS — I10 Essential (primary) hypertension: Secondary | ICD-10-CM | POA: Diagnosis not present

## 2015-05-07 DIAGNOSIS — F32A Depression, unspecified: Secondary | ICD-10-CM

## 2015-05-07 MED ORDER — DOXYCYCLINE HYCLATE 100 MG PO CAPS: 100.0000 mg | ORAL_CAPSULE | Freq: Two times a day (BID) | ORAL | Status: DC

## 2015-05-07 MED ORDER — PAROXETINE HCL 20 MG PO TABS: 20.0000 mg | ORAL_TABLET | Freq: Every day | ORAL | Status: DC

## 2015-05-07 NOTE — Patient Instructions (Signed)

## 2015-05-07 NOTE — Progress Notes (Signed)
Subjective:  Patient ID: Ruth Walker, female    DOB: 01-Feb-1946  Age: 69 y.o. MRN: [id number]  CC: Allergies; Sinusitis; and Sores in nose   HPI Ruth Walker presents  she has numerous complaints predominately related to anxiety depression. His chronically ill. They're very financially strapped. She was previously prescribed Paxil and Xanax and is failed fill the Paxil due to financial concerns. She is taking the Xanax 2 or 3 times a week. In his sleeping as a consequence of taking the medication. She's only taking it once a day for congestion and drainage is clear in color she has no fever chills nausea vomiting cough wheezing or shortness of breath. He has a sore on the right side of her nostril  History Ruth Walker has a past medical history of Hemorrhoids; Irritable bowel syndrome (IBS); Asthma; Skin cancer; Cholelithiasis; Diverticulosis; Anal fissure; Anxiety states; Uterine polyp; Osteoporosis; Vertebral compression fracture ([place]); Environmental allergies; Allergy; and Depression.   She has past surgical history that includes Cesarean section; Hemorrhoid surgery; Back surgery; Skin cancer excision; Endometrial polypectomy; Hysteroscopy; Dilation and curettage of uterus; Mouth surgery; Fracture surgery; and IBS.   Her  family history includes Diabetes in her mother; Heart disease in her father; Hypertension in her father and mother; Thyroid disease in her mother and sister. There is no history of Colon cancer.  She   reports that she has never smoked. She has never used smokeless tobacco. She reports that she does not drink alcohol or use illicit drugs.  Outpatient Prescriptions Prior to Visit  Medication Sig Dispense Refill  . ALPRAZolam (XANAX) 0.25 MG tablet Take 1 tablet (0.25 mg total) by mouth 2 (two) times daily as needed for anxiety. 60 tablet 2  . amoxicillin-clavulanate (AUGMENTIN) 875-125 MG per tablet Take 1 tablet by mouth 2 (two) times daily. 20 tablet 0  . aspirin  81 MG tablet Take 81 mg by mouth daily.      . Calcium Carbonate (CALCIUM 600 PO) Take 1 tablet by mouth 2 (two) times daily.    . cycloSPORINE (RESTASIS) 0.05 % ophthalmic emulsion 1 drop 2 (two) times daily.      Marland Kitchen estradiol (ESTRACE) 0.1 MG/GM vaginal cream Place 7.62 Applicatorfuls vaginally 2 (two) times a week. 42.5 g 8  . FLUoxetine (PROZAC) 20 MG tablet Take 1 tablet (20 mg total) by mouth daily. 30 tablet 5  . fluticasone (FLONASE) 50 MCG/ACT nasal spray Place 2 sprays into both nostrils daily. 16 g 12  . hydrochlorothiazide (HYDRODIURIL) 25 MG tablet take 1 tablet by mouth once daily 90 tablet 3  . metoprolol tartrate (LOPRESSOR) 25 MG tablet Take 1 tablet (25 mg total) by mouth 2 (two) times daily. 60 tablet 11  . polyethylene glycol (MIRALAX / GLYCOLAX) packet Take 17 g by mouth daily. dissolve in 8 oz. Of water at bedtime     . PRESCRIPTION MEDICATION Allergy injection once a week.[facility] [employer]    . Probiotic Product (ALIGN) 4 MG CAPS Take 1 capsule by mouth daily. 21 capsule 0  . traZODone (DESYREL) 50 MG tablet Take 0.5-1 tablets (25-50 mg total) by mouth at bedtime as needed for sleep. 30 tablet 3  . Vitamin D, Ergocalciferol, (DRISDOL) 50000 UNITS CAPS capsule Take 1 capsule (50,000 Units total) by mouth once a week. 15 capsule 3  . Wheat Dextrin (BENEFIBER DRINK MIX PO) Take 10 mLs by mouth every morning. Take 2 teaspoons every morning      . PARoxetine (PAXIL) 20  MG tablet Take 1 tablet (20 mg total) by mouth daily. Do not abruptly stop the medicine or you will feel bad (Patient not taking: Reported on 05/07/2015) 30 tablet 2   No facility-administered medications prior to visit.    Social History   Social History  . Marital Status: Married    Spouse Name: N/A  . Number of Children: 2  . Years of Education: N/A   Occupational History  . Retired    Social History Main Topics  . Smoking status: Never Smoker   . Smokeless tobacco: Never Used  .  Alcohol Use: No  . Drug Use: No  . Sexual Activity: Yes    Birth Control/ Protection: Post-menopausal     Comment: number of sex partners in the last 13 months  1   Other Topics Concern  . None   Social History Narrative   Patient gets regular exercise. Cares for her grandchildren.     Review of Systems  Constitutional: Negative for fever, chills and appetite change.  HENT: Negative for congestion, ear pain, postnasal drip, sinus pressure and sore throat.   Eyes: Negative for pain and redness.  Respiratory: Negative for cough, shortness of breath and wheezing.   Cardiovascular: Negative for leg swelling.  Gastrointestinal: Negative for nausea, vomiting, abdominal pain, diarrhea, constipation and blood in stool.  Endocrine: Negative for polyuria.  Genitourinary: Negative for dysuria, urgency, frequency and flank pain.  Musculoskeletal: Negative for gait problem.  Skin: Negative for rash.  Neurological: Negative for weakness and headaches.  Psychiatric/Behavioral: Negative for confusion and decreased concentration. The patient is not nervous/anxious.     Objective:  BP 148/92 mmHg  Pulse 82  Temp(Src) 98.3 F (36.8 C) (Oral)  Resp 18  Ht 5' 6.5" (1.689 m)  Wt 128 lb (58.06 kg)  BMI 20.35 kg/m2  SpO2 99%  Physical Exam  Constitutional: She is oriented to person, place, and time. She appears well-developed and well-nourished. No distress.  HENT:  Head: Normocephalic and atraumatic.  Right Ear: External ear normal.  Left Ear: External ear normal.  Nose: Nose normal.  Eyes: Conjunctivae and EOM are normal. Pupils are equal, round, and reactive to light. No scleral icterus.  Neck: Normal range of motion. Neck supple. No tracheal deviation present.  Cardiovascular: Normal rate, regular rhythm and normal heart sounds.   Pulmonary/Chest: Effort normal. No respiratory distress. She has no wheezes. She has no rales.  Abdominal: She exhibits no mass. There is no tenderness.  There is no rebound and no guarding.  Musculoskeletal: She exhibits no edema.  Lymphadenopathy:    She has no cervical adenopathy.  Neurological: She is alert and oriented to person, place, and time. Coordination normal.  Skin: Skin is warm and dry. No rash noted.  Psychiatric: She has a normal mood and affect. Her behavior is normal.      Assessment & Plan:   Ruth Walker was seen today for allergies, sinusitis and sores in nose.  Diagnoses and all orders for this visit:  Anxiety and depression  Essential hypertension  Cellulitis of sidewall of nose  Other orders -     PARoxetine (PAXIL) 20 MG tablet; Take 1 tablet (20 mg total) by mouth daily. -     doxycycline (VIBRAMYCIN) 100 MG capsule; Take 1 capsule (100 mg total) by mouth 2 (two) times daily.  I am having Ruth Walker start on PARoxetine and doxycycline. I am also having her maintain her aspirin, Wheat Dextrin (BENEFIBER DRINK MIX PO), polyethylene glycol, cycloSPORINE,  ALIGN, PRESCRIPTION MEDICATION, Calcium Carbonate (CALCIUM 600 PO), amoxicillin-clavulanate, FLUoxetine, Vitamin D (Ergocalciferol), traZODone, metoprolol tartrate, hydrochlorothiazide, fluticasone, estradiol, ALPRAZolam, and PARoxetine.  Meds ordered this encounter  Medications  . PARoxetine (PAXIL) 20 MG tablet    Sig: Take 1 tablet (20 mg total) by mouth daily.    Dispense:  30 tablet    Refill:  5  . doxycycline (VIBRAMYCIN) 100 MG capsule    Sig: Take 1 capsule (100 mg total) by mouth 2 (two) times daily.    Dispense:  20 capsule    Refill:  0    Appropriate red flag conditions were discussed with the patient as well as actions that should be taken.  Patient expressed his understanding.  Follow-up: Return if symptoms worsen or fail to improve.  Roselee Culver, MD

## 2015-06-23 ENCOUNTER — Ambulatory Visit (INDEPENDENT_AMBULATORY_CARE_PROVIDER_SITE_OTHER): Payer: Medicare Other | Admitting: Family Medicine

## 2015-06-23 VITALS — BP 132/94 | HR 85 | Temp 98.2°F | Resp 18 | Ht 66.5 in | Wt 128.8 lb

## 2015-06-23 DIAGNOSIS — F411 Generalized anxiety disorder: Secondary | ICD-10-CM | POA: Diagnosis not present

## 2015-06-23 DIAGNOSIS — I781 Nevus, non-neoplastic: Secondary | ICD-10-CM | POA: Diagnosis not present

## 2015-06-23 DIAGNOSIS — J069 Acute upper respiratory infection, unspecified: Secondary | ICD-10-CM | POA: Diagnosis not present

## 2015-06-23 MED ORDER — ALPRAZOLAM 0.25 MG PO TABS: 0.2500 mg | ORAL_TABLET | Freq: Two times a day (BID) | ORAL | Status: DC | PRN

## 2015-06-23 MED ORDER — AZITHROMYCIN 250 MG PO TABS: ORAL_TABLET | ORAL | Status: DC

## 2015-06-23 NOTE — Progress Notes (Signed)
Subjective:  This chart was scribed for Ruth Haber MD, by Ruth Walker,scribe, at [facility].  This patient was seen in room 10 and the patient's care was started at 11:21 AM.    Patient ID: Ruth Walker, female    DOB: 09-14-1945, 69 y.o.   MRN: [id number] Chief Complaint  Patient presents with  . worried  that she might be getting sick    nasal drainage, itchy throat  . Other    mole on left breast    HPI  HPI Comments: Ruth Walker is a 69 y.o. female who presents to the [facility] complaining of post nasal drip and an "itchy throat"/ productive cough intermittently for the past couple of weeks.  She is also worried about a mole on her left breast. Patient states that her grandson was sick recently (she has been spending plenty of time with him).  She has no other questions or concerns today.   She is taking Xanax every other day which is helping her sleep.  She is not currently taking her Paxil or Prozac.     Patient Active Problem List   Diagnosis Date Noted  . External hemorrhoids 03/04/2012  . DVT (deep venous thrombosis) ([place])   . Uterine polyp   . Osteoporosis   . Vertebral compression fracture ([place])   . IBS (irritable bowel syndrome) 09/05/2011  . Constipation, slow transit 09/05/2011  . Anxiety disorder 09/05/2011  . Diverticulosis 11/04/2010  . Gallstones 11/04/2010  . ANAL FISSURE 05/26/2008  . OBSESSIVE-COMPULSIVE PERSONALITY DISORDER 02/28/2008  . HYPERTENSION 02/28/2008  . HEMORRHOIDS 02/28/2008  . ASTHMA 02/28/2008  . CYSTITIS, CHRONIC 02/28/2008   Past Medical History  Diagnosis Date  . Hemorrhoids   . Irritable bowel syndrome (IBS)   . Asthma     Gets allergy shots once a week   . Skin cancer     Leg  . Cholelithiasis   . Diverticulosis   . Anal fissure   . Anxiety states   . Uterine polyp   . Osteoporosis   . Vertebral compression fracture ([place])   . Environmental allergies   . Allergy     . Depression    Past Surgical History  Procedure Laterality Date  . Cesarean section    . Hemorrhoid surgery    . Back surgery    . Skin cancer excision      Leg   . Endometrial polypectomy    . Hysteroscopy    . Dilation and curettage of uterus    . Mouth surgery    . Fracture surgery      neck (vertebrae)  . Ibs     Allergies  Allergen Reactions  . Aloe   . Benadryl [Diphenhydramine Hcl] Other (See Comments)    Makes her feel "hyper"  . Cephalexin     REACTION: coarse rash  . Ciprofloxacin   . Mucinex [Guaifenesin Er] Other (See Comments)    Makes her feel worse  . Shellfish Allergy Hives  . Sudafed [Pseudoephedrine Hcl] Hives and Other (See Comments)    Keeps her awake  . Sulfonamide Derivatives    Prior to Admission medications   Medication Sig Start Date End Date Taking? Authorizing Provider  ALPRAZolam (XANAX) 0.25 MG tablet Take 1 tablet (0.25 mg total) by mouth 2 (two) times daily as needed for anxiety. 03/25/15  Yes Ruth Haber, MD  amoxicillin-clavulanate (AUGMENTIN) 875-125 MG per tablet Take 1 tablet by mouth 2 (  two) times daily. 12/27/14  Yes Wardell Honour, MD  aspirin 81 MG tablet Take 81 mg by mouth daily.     Yes Historical Provider, MD  Calcium Carbonate (CALCIUM 600 PO) Take 1 tablet by mouth 2 (two) times daily.   Yes Historical Provider, MD  cycloSPORINE (RESTASIS) 0.05 % ophthalmic emulsion 1 drop 2 (two) times daily.     Yes Historical Provider, MD  estradiol (ESTRACE) 0.1 MG/GM vaginal cream Place [id number] Applicatorfuls vaginally 2 (two) times a week. 02/24/15  Yes Roselee Culver, MD  FLUoxetine (PROZAC) 20 MG tablet Take 1 tablet (20 mg total) by mouth daily. 01/03/15  Yes Gay Filler Copland, MD  fluticasone (FLONASE) 50 MCG/ACT nasal spray Place 2 sprays into both nostrils daily. 02/24/15  Yes Roselee Culver, MD  hydrochlorothiazide (HYDRODIURIL) 25 MG tablet take 1 tablet by mouth once daily 02/24/15  Yes Roselee Culver, MD  metoprolol  tartrate (LOPRESSOR) 25 MG tablet Take 1 tablet (25 mg total) by mouth 2 (two) times daily. 02/24/15  Yes Roselee Culver, MD  polyethylene glycol [facility] / GLYCOLAX) packet Take 17 g by mouth daily. dissolve in 8 oz. Of water at bedtime    Yes Historical Provider, MD  PRESCRIPTION MEDICATION Allergy injection once a week.[facility] [employer]   Yes Historical Provider, MD  Probiotic Product (ALIGN) 4 MG CAPS Take 1 capsule by mouth daily. 07/05/11  Yes Sable Feil, MD  traZODone (DESYREL) 50 MG tablet Take 0.5-1 tablets (25-50 mg total) by mouth at bedtime as needed for sleep. 02/24/15  Yes Roselee Culver, MD  Vitamin D, Ergocalciferol, (DRISDOL) 50000 UNITS CAPS capsule Take 1 capsule (50,000 Units total) by mouth once a week. 02/24/15  Yes Roselee Culver, MD  Wheat Dextrin (BENEFIBER DRINK MIX PO) Take 10 mLs by mouth every morning. Take 2 teaspoons every morning     Yes Historical Provider, MD  PARoxetine (PAXIL) 20 MG tablet Take 1 tablet (20 mg total) by mouth daily. Do not abruptly stop the medicine or you will feel bad Patient not taking: Reported on 05/07/2015 03/26/15   Ruth Haber, MD  PARoxetine (PAXIL) 20 MG tablet Take 1 tablet (20 mg total) by mouth daily. Patient not taking: Reported on 06/23/2015 05/07/15   Roselee Culver, MD   Social History   Social History  . Marital Status: Married    Spouse Name: N/A  . Number of Children: 2  . Years of Education: N/A   Occupational History  . Retired    Social History Main Topics  . Smoking status: Never Smoker   . Smokeless tobacco: Never Used  . Alcohol Use: No  . Drug Use: No  . Sexual Activity: Yes    Birth Control/ Protection: Post-menopausal     Comment: number of sex partners in the last 39 months  1   Other Topics Concern  . Not on file   Social History Narrative   Patient gets regular exercise. Cares for her grandchildren.        Review of Systems  Constitutional: Negative  for fever and chills.  HENT: Positive for rhinorrhea.   Eyes: Negative for pain, redness and itching.  Gastrointestinal: Negative for nausea and vomiting.  Musculoskeletal: Negative for neck pain and neck stiffness.       Objective:   Physical Exam Filed Vitals:   06/23/15 1109  BP: 132/94  Pulse: 85  Temp: 98.2 F (36.8 C)  TempSrc: Oral  Resp: 18  Height: 5' 6.5" (1.689 m)  Weight: 128 lb 12.8 oz (58.423 kg)  SpO2: 98%     CONSTITUTIONAL: Well developed/well nourished HEAD: Normocephalic/atraumatic EYES: EOMI/PERRL ENMT: Mucous membranes moist, cavities.  NECK: supple no meningeal signs SPINE/BACK:entire spine nontender CV: S1/S2 noted, no murmurs/rubs/gallops noted LUNGS: Lungs are clear to auscultation bilaterally, no apparent distress NEURO: Pt is awake/alert/appropriate, moves all extremitiesx4.  No facial droop.   EXTREMITIES: pulses normal/equal, full ROM SKIN: warm, color normal, nevus on left breast.  PSYCH: no abnormalities of mood noted, alert and oriented to situation. She tends to worry about multiple problems.      Assessment & Plan:   This chart was scribed in my presence and reviewed by me personally.    ICD-9-CM ICD-10-CM   1. Acute upper respiratory infection 465.9 J06.9 azithromycin (ZITHROMAX) 250 MG tablet  2. Nevus, non-neoplastic 448.1 I78.1   3. Generalized anxiety disorder 300.02 F41.1 ALPRAZolam (XANAX) 0.25 MG tablet     Signed, Ruth Haber, MD

## 2015-06-24 ENCOUNTER — Telehealth: Payer: Self-pay

## 2015-06-24 NOTE — Telephone Encounter (Signed)
She was seen on 12/14 by Dr. Joseph Art. She would like a refill on her  triamcinolone cream (KENALOG) 0.1 % [id number] DISCONTINUED and Vitamin D, Ergocalciferol, (DRISDOL) 50000 UNITS CAPS [id number] DISCONTINUED. She would like [place] to use the [facility] on [address]. CB # [phone]

## 2015-06-25 MED ORDER — TRIAMCINOLONE ACETONIDE 0.1 % EX CREA: 1.0000 "application " | TOPICAL_CREAM | Freq: Two times a day (BID) | CUTANEOUS | Status: DC

## 2015-06-25 NOTE — Telephone Encounter (Signed)
Notified on VM.

## 2015-06-25 NOTE — Telephone Encounter (Signed)
Done

## 2015-06-28 ENCOUNTER — Ambulatory Visit (INDEPENDENT_AMBULATORY_CARE_PROVIDER_SITE_OTHER): Payer: Medicare Other | Admitting: Family Medicine

## 2015-06-28 VITALS — BP 144/82 | HR 71 | Temp 98.1°F | Resp 18 | Ht 66.5 in | Wt 128.0 lb

## 2015-06-28 DIAGNOSIS — J301 Allergic rhinitis due to pollen: Secondary | ICD-10-CM | POA: Diagnosis not present

## 2015-06-28 DIAGNOSIS — J309 Allergic rhinitis, unspecified: Secondary | ICD-10-CM

## 2015-06-28 DIAGNOSIS — L570 Actinic keratosis: Secondary | ICD-10-CM | POA: Diagnosis not present

## 2015-06-28 MED ORDER — METHYLPREDNISOLONE ACETATE 80 MG/ML IJ SUSP
80.0000 mg | Freq: Once | INTRAMUSCULAR | Status: AC
  Administered 2015-06-28: 80 mg via INTRAMUSCULAR

## 2015-06-28 NOTE — Progress Notes (Signed)
Subjective:  This chart was scribed for Ruth Cheadle MD, by Tamsen Roers, at [facility].  This patient was seen in room 6 and the patient's care was started at 10:46 AM.    Patient ID: Ruth Walker, female    DOB: June 17, 1946, 69 y.o.   MRN: [id number] Chief Complaint  Patient presents with  . Follow-up    uri   . URI    HPI  HPI Comments: Ruth Walker is a 69 y.o. female who presents to the [facility] for a follow up regarding her upper respiratory infection with symptoms of worsening post nasal drip and itchy throat)  which she came in for (5 days ago) ago and saw Dr. Joseph Art.   She states that she now has thicker mucous than she did during her last visit and doesn't feel like she is getting any better.  She has been drinking hot tea, plenty of water and took Claritin as well but denies any full relief.  Patient will be going to the beach with her family and would like to make sure that she is not getting ill before her trip. She denies any severe sinus pressure.   Rash: She is also complaining of a red spot on the center of her chest which she states she has been itching and irritating more often recently.  Patient states that the spot was "less red" about a month ago and thinks she may have irritated it.  She has had skin cancer on her leg in the past which she had removed.   Two weeks ago, patient received an allergy shot for her allergies.      Past Medical History  Diagnosis Date  . Hemorrhoids   . Irritable bowel syndrome (IBS)   . Asthma     Gets allergy shots once a week   . Skin cancer     Leg  . Cholelithiasis   . Diverticulosis   . Anal fissure   . Anxiety states   . Uterine polyp   . Osteoporosis   . Vertebral compression fracture ([place])   . Environmental allergies   . Allergy   . Depression     Current Outpatient Prescriptions on File Prior to Visit  Medication Sig Dispense Refill  . ALPRAZolam (XANAX)  0.25 MG tablet Take 1 tablet (0.25 mg total) by mouth 2 (two) times daily as needed for anxiety. 60 tablet 2  . aspirin 81 MG tablet Take 81 mg by mouth daily.      Marland Kitchen azithromycin (ZITHROMAX) 250 MG tablet Take 2 tabs PO x 1 dose, then 1 tab PO QD x 4 days 6 tablet 0  . Calcium Carbonate (CALCIUM 600 PO) Take 1 tablet by mouth 2 (two) times daily.    . cycloSPORINE (RESTASIS) 0.05 % ophthalmic emulsion 1 drop 2 (two) times daily.      Marland Kitchen estradiol (ESTRACE) 0.1 MG/GM vaginal cream Place [id number] Applicatorfuls vaginally 2 (two) times a week. 42.5 g 8  . fluticasone (FLONASE) 50 MCG/ACT nasal spray Place 2 sprays into both nostrils daily. 16 g 12  . hydrochlorothiazide (HYDRODIURIL) 25 MG tablet take 1 tablet by mouth once daily 90 tablet 3  . metoprolol tartrate (LOPRESSOR) 25 MG tablet Take 1 tablet (25 mg total) by mouth 2 (two) times daily. 60 tablet 11  . polyethylene glycol (MIRALAX / GLYCOLAX) packet Take 17 g by mouth daily. dissolve in 8 oz. Of water at bedtime     .  PRESCRIPTION MEDICATION Allergy injection once a week.[facility] [employer]    . Probiotic Product (ALIGN) 4 MG CAPS Take 1 capsule by mouth daily. 21 capsule 0  . triamcinolone cream (KENALOG) 0.1 % Apply 1 application topically 2 (two) times daily. 30 g 0  . Vitamin D, Ergocalciferol, (DRISDOL) 50000 UNITS CAPS capsule Take 1 capsule (50,000 Units total) by mouth once a week. 15 capsule 3  . Wheat Dextrin (BENEFIBER DRINK MIX PO) Take 10 mLs by mouth every morning. Take 2 teaspoons every morning      . [DISCONTINUED] Linaclotide (LINZESS) 145 MCG CAPS Take 1 capsule by mouth daily. 8 capsule 0   No current facility-administered medications on file prior to visit.    Allergies  Allergen Reactions  . Aloe   . Benadryl [Diphenhydramine Hcl] Other (See Comments)    Makes her feel "hyper"  . Cephalexin     REACTION: coarse rash  . Ciprofloxacin   . Mucinex [Guaifenesin Er] Other (See Comments)    Makes her feel  worse  . Shellfish Allergy Hives  . Sudafed [Pseudoephedrine Hcl] Hives and Other (See Comments)    Keeps her awake  . Sulfonamide Derivatives        Review of Systems  Constitutional: Negative for fever and chills.  HENT: Positive for congestion and postnasal drip. Negative for sinus pressure.   Eyes: Negative for pain, redness and itching.  Gastrointestinal: Negative for nausea and vomiting.  Musculoskeletal: Negative for neck pain and neck stiffness.  Skin: Positive for rash.       Objective:   Physical Exam  Constitutional: She is oriented to person, place, and time. She appears well-developed and well-nourished. No distress.  HENT:  Head: Normocephalic and atraumatic.  Eyes: Pupils are equal, round, and reactive to light.  Neck: Normal range of motion.  Cardiovascular: Normal rate, regular rhythm and normal heart sounds.  Exam reveals no gallop and no friction rub.   No murmur heard. Pulmonary/Chest: Effort normal and breath sounds normal. No respiratory distress. She has no wheezes. She has no rales.  Musculoskeletal: Normal range of motion.  Neurological: She is alert and oriented to person, place, and time.  Skin: Rash noted.    Filed Vitals:   06/28/15 1020  BP: 144/82  Pulse: 71  Temp: 98.1 F (36.7 C)  TempSrc: Oral  Resp: 18  Height: 5' 6.5" (1.689 m)  Weight: 128 lb (58.06 kg)  SpO2: 96%         Assessment & Plan:   1. Hay fever   2. Allergic rhinitis, unspecified allergic rhinitis type   3. Actinic keratosis     Meds ordered this encounter  Medications  . methylPREDNISolone acetate (DEPO-MEDROL) injection 80 mg    Sig:     I personally performed the services described in this documentation, which was scribed in my presence. The recorded information has been reviewed and considered, and addended by me as needed.  Ruth Cheadle, MD MPH

## 2015-07-07 ENCOUNTER — Ambulatory Visit (INDEPENDENT_AMBULATORY_CARE_PROVIDER_SITE_OTHER): Payer: Medicare Other

## 2015-07-07 ENCOUNTER — Telehealth: Payer: Self-pay

## 2015-07-07 ENCOUNTER — Ambulatory Visit (INDEPENDENT_AMBULATORY_CARE_PROVIDER_SITE_OTHER): Payer: Medicare Other | Admitting: Emergency Medicine

## 2015-07-07 VITALS — BP 124/82 | HR 88 | Temp 98.4°F | Resp 16 | Ht 66.5 in | Wt 128.0 lb

## 2015-07-07 DIAGNOSIS — R059 Cough, unspecified: Secondary | ICD-10-CM

## 2015-07-07 DIAGNOSIS — R05 Cough: Secondary | ICD-10-CM

## 2015-07-07 DIAGNOSIS — L989 Disorder of the skin and subcutaneous tissue, unspecified: Secondary | ICD-10-CM

## 2015-07-07 DIAGNOSIS — J309 Allergic rhinitis, unspecified: Secondary | ICD-10-CM | POA: Diagnosis not present

## 2015-07-07 NOTE — Telephone Encounter (Signed)
Pt states someone was going to refer her to a Derm and she hasn't heard from anyone also she had x-rays done and was told she would get a call today once the Radiologist read them and she is  Still waiting. Please call and leave message at [phone]

## 2015-07-07 NOTE — Progress Notes (Signed)
Patient ID: Ruth Walker, female   DOB: [birth year], 69 y.o.   MRN: [id number]     By signing my name below, I, Zola Button, attest that this documentation has been prepared under the direction and in the presence of Arlyss Queen, MD.  Electronically Signed: Zola Button, Medical Scribe. 07/07/2015. 9:26 AM.   Chief Complaint:  Chief Complaint  Patient presents with  . recurring cold symptoms    Runny nose, sore throat began again yesterday - began taking Zpack that was rxed by Brigitte Pulse last night     HPI: Ruth Walker is a 69 y.o. female who reports to [facility] today complaining of gradual onset congestion that started last night. Patient also reports having associated sore throat and rhinorrhea. She has some difficulty sleeping due to her symptoms. She has had URI symptoms for the past month which had resolved, but started again yesterday. She had been seen by Dr. Joseph Art and Dr. Brigitte Pulse this past month. Patient was at the beach last week and notes that 2 of her grandchildren were ill. She started the Z-pak last night, which was prescribed by Dr. Brigitte Pulse.  Patient also has a red spot on her chest. She had this frozen off when she was seen by Dr. Brigitte Pulse last week. She notes that the area is less red now, but it is still there.   Past Medical History  Diagnosis Date  . Hemorrhoids   . Irritable bowel syndrome (IBS)   . Asthma     Gets allergy shots once a week   . Skin cancer     Leg  . Cholelithiasis   . Diverticulosis   . Anal fissure   . Anxiety states   . Uterine polyp   . Osteoporosis   . Vertebral compression fracture ([place])   . Environmental allergies   . Allergy   . Depression    Past Surgical History  Procedure Laterality Date  . Cesarean section    . Hemorrhoid surgery    . Back surgery    . Skin cancer excision      Leg   . Endometrial polypectomy    . Hysteroscopy    . Dilation and curettage of uterus    . Mouth surgery    . Fracture surgery      neck (vertebrae)    . Ibs     Social History   Social History  . Marital Status: Married    Spouse Name: N/A  . Number of Children: 2  . Years of Education: N/A   Occupational History  . Retired    Social History Main Topics  . Smoking status: Never Smoker   . Smokeless tobacco: Never Used  . Alcohol Use: No  . Drug Use: No  . Sexual Activity: Yes    Birth Control/ Protection: Post-menopausal     Comment: number of sex partners in the last 53 months  1   Other Topics Concern  . None   Social History Narrative   Patient gets regular exercise. Cares for her grandchildren.   Family History  Problem Relation Age of Onset  . Diabetes Mother   . Hypertension Mother   . Thyroid disease Mother   . Colon cancer Neg Hx   . Hypertension Father   . Heart disease Father   . Thyroid disease Sister    Allergies  Allergen Reactions  . Aloe   . Benadryl [Diphenhydramine Hcl] Other (See Comments)    Makes her feel "hyper"  .  Cephalexin     REACTION: coarse rash  . Ciprofloxacin   . Mucinex [Guaifenesin Er] Other (See Comments)    Makes her feel worse  . Shellfish Allergy Hives  . Sudafed [Pseudoephedrine Hcl] Hives and Other (See Comments)    Keeps her awake  . Sulfonamide Derivatives    Prior to Admission medications   Medication Sig Start Date End Date Taking? Authorizing Provider  ALPRAZolam (XANAX) 0.25 MG tablet Take 1 tablet (0.25 mg total) by mouth 2 (two) times daily as needed for anxiety. 06/23/15  Yes [name], MD  aspirin 81 MG tablet Take 81 mg by mouth daily.     Yes Historical Provider, MD  azithromycin (ZITHROMAX) 250 MG tablet Take 2 tabs PO x 1 dose, then 1 tab PO QD x 4 days 06/23/15  Yes [name], MD  Calcium Carbonate (CALCIUM 600 PO) Take 1 tablet by mouth 2 (two) times daily.   Yes Historical Provider, MD  cycloSPORINE (RESTASIS) 0.05 % ophthalmic emulsion 1 drop 2 (two) times daily.     Yes Historical Provider, MD  estradiol (ESTRACE) 0.1 MG/GM vaginal  cream Place [id number] Applicatorfuls vaginally 2 (two) times a week. 02/24/15  Yes Roselee Culver, MD  fluticasone (FLONASE) 50 MCG/ACT nasal spray Place 2 sprays into both nostrils daily. 02/24/15  Yes Roselee Culver, MD  hydrochlorothiazide (HYDRODIURIL) 25 MG tablet take 1 tablet by mouth once daily 02/24/15  Yes Roselee Culver, MD  metoprolol tartrate (LOPRESSOR) 25 MG tablet Take 1 tablet (25 mg total) by mouth 2 (two) times daily. 02/24/15  Yes Roselee Culver, MD  polyethylene glycol [facility] / GLYCOLAX) packet Take 17 g by mouth daily. dissolve in 8 oz. Of water at bedtime    Yes Historical Provider, MD  PRESCRIPTION MEDICATION Allergy injection once a week.[facility]land Kitchen[facility] [employer]   Yes Historical Provider, MD  Probiotic Product (ALIGN) 4 MG CAPS Take 1 capsule by mouth daily. 07/05/11  Yes Sable Feil, MD  triamcinolone cream (KENALOG) 0.1 % Apply 1 application topically 2 (two) times daily. 06/25/15  Yes Mancel Bale, PA-C  Vitamin D, Ergocalciferol, (DRISDOL) 50000 UNITS CAPS capsule Take 1 capsule (50,000 Units total) by mouth once a week. 02/24/15  Yes Roselee Culver, MD  Wheat Dextrin (BENEFIBER DRINK MIX PO) Take 10 mLs by mouth every morning. Take 2 teaspoons every morning     Yes Historical Provider, MD     ROS: The patient denies fevers, chills, night sweats, unintentional weight loss, chest pain, palpitations, wheezing, dyspnea on exertion, nausea, vomiting, abdominal pain, dysuria, hematuria, melena, numbness, weakness, or tingling.   All other systems have been reviewed and were otherwise negative with the exception of those mentioned in the HPI and as above.    PHYSICAL EXAM: Filed Vitals:   07/07/15 0921  BP: 124/82  Pulse: 88  Temp: 98.4 F (36.9 C)  Resp: 16   Body mass index is 20.35 kg/(m^2).   General: Alert, no acute distress HEENT:  Normocephalic, atraumatic, oropharynx patent. Mild nasal congestion. Throat slightly red. Eye: Juliette Mangle  [facility] Cardiovascular:  Regular rate and rhythm, no rubs murmurs or gallops.  No Carotid bruits, radial pulse intact. No pedal edema.  Respiratory: Clear to auscultation bilaterally.  No wheezes, rales, or rhonchi.  No cyanosis, no use of accessory musculature Abdominal: No organomegaly, abdomen is soft and non-tender, positive bowel sounds.  No masses. Musculoskeletal: Gait intact. No edema, tenderness Skin: 1.5 cm x 1.5 cm red, irregular  area on chest. Neurologic: Facial musculature symmetric. Psychiatric: Patient acts appropriately throughout our interaction. Lymphatic: No cervical or submandibular lymphadenopathy    LABS:    EKG/XRAY:    Primary read interpreted by Dr. Everlene Farrier at [facility].  CXR shows mild hyperexpansion, no pneumonic infiltrates. Heart size normal. Sinus films are normal.  ASSESSMENT/PLAN: Patient does not look ill. Referral made for lesion on her chest. I suspect this is  an actinic keratosis. She has what appears to be allergic rhinitis and possibly  some early secondary sinusitis. Chest x-ray and sinus films are unremarkable. She had restarted her Z-Pak and I will encourage her to take her Flonase inhaler and also to be on zyrtec.I personally performed the services described in this documentation, which was scribed in my presence. The recorded information has been reviewed and is accurate.    Gross sideeffects, risk and benefits, and alternatives of medications d/w patient. Patient is aware that all medications have potential sideeffects and we are unable to predict every sideeffect or drug-drug interaction that may occur.  Arlyss Queen MD 07/07/2015 9:26 AM

## 2015-07-08 NOTE — Telephone Encounter (Signed)
The schedulers are working on her appointment with dermatology. The radiologist did not see any signs of sinusitis or pneumonia.

## 2015-07-08 NOTE — Telephone Encounter (Signed)
Left message for pt to call back  °

## 2015-07-10 ENCOUNTER — Ambulatory Visit (INDEPENDENT_AMBULATORY_CARE_PROVIDER_SITE_OTHER): Payer: Medicare Other | Admitting: Internal Medicine

## 2015-07-10 VITALS — BP 174/100 | HR 87 | Temp 98.4°F | Resp 16 | Ht 65.5 in | Wt 129.2 lb

## 2015-07-10 DIAGNOSIS — J0100 Acute maxillary sinusitis, unspecified: Secondary | ICD-10-CM | POA: Diagnosis not present

## 2015-07-10 MED ORDER — AMOXICILLIN 875 MG PO TABS
875.0000 mg | ORAL_TABLET | Freq: Two times a day (BID) | ORAL | Status: DC
Start: 2015-07-10 — End: 2016-03-03

## 2015-07-10 NOTE — Progress Notes (Signed)
Subjective:    Patient ID: Ruth Walker, female    DOB: 30-Nov-1945, 69 y.o.   MRN: [id number] By signing my name below, I, Ruth Walker, attest that this documentation has been prepared under the direction and in the presence of Tami Lin, MD. Electronically Signed: Judithe Walker, ER Scribe. 07/10/2015. 12:25 PM.  Chief Complaint  Patient presents with  . Follow-up    was seen 4 days ago; states she is still not feeling better; has been on a z-pack  . Cough    HPI HPI Comments: Ruth Walker is a 69 y.o. female who presents to [facility] complaining of continuing cough and cold sx for the last month. Two weeks ago she went to the beach and got completely better for one week, then got worse again and has had sinus congestion for the last week. She reported to [facility] three days ago and was prescribed zithromax, flonase and claritin by Dr. Everlene Farrier but states her mucous has dried up and she has developed sinus pressure. Her cough is very much improved, and is just a tickle at that point. She denies ear pain. When she is able to blow her nose her nasal drainage is thick and yellow.    Past Medical History  Diagnosis Date  . Hemorrhoids   . Irritable bowel syndrome (IBS)   . Asthma     Gets allergy shots once a week   . Skin cancer     Leg  . Cholelithiasis   . Diverticulosis   . Anal fissure   . Anxiety states   . Uterine polyp   . Osteoporosis   . Vertebral compression fracture ([place])   . Environmental allergies   . Allergy   . Depression    Allergies  Allergen Reactions  . Aloe   . Benadryl [Diphenhydramine Hcl] Other (See Comments)    Makes her feel "hyper"  . Cephalexin     REACTION: coarse rash  . Ciprofloxacin   . Mucinex [Guaifenesin Er] Other (See Comments)    Makes her feel worse  . Shellfish Allergy Hives  . Sudafed [Pseudoephedrine Hcl] Hives and Other (See Comments)    Keeps her awake  . Sulfonamide Derivatives     Review of Systems    Constitutional: Negative for fever and chills.  HENT: Positive for congestion and sinus pressure. Negative for ear pain.   Respiratory: Positive for cough. Negative for shortness of breath and wheezing.       Objective:  BP 174/100 mmHg  Pulse 87  Temp(Src) 98.4 F (36.9 C) (Oral)  Resp 16  Ht 5' 5.5" (1.664 m)  Wt 129 lb 3.2 oz (58.605 kg)  BMI 21.17 kg/m2  SpO2 98%  Physical Exam  Constitutional: She is oriented to person, place, and time. She appears well-developed and well-nourished. No distress.  HENT:  Head: Normocephalic and atraumatic.  Purulent nasal d/c with max tend to perc  Eyes: Conjunctivae are normal. Pupils are equal, round, and reactive to light.  Neck: Neck supple.  Cardiovascular: Normal rate, regular rhythm and normal heart sounds.   Pulmonary/Chest: Effort normal and breath sounds normal. No respiratory distress. She has no wheezes.  Musculoskeletal: Normal range of motion.  Neurological: She is alert and oriented to person, place, and time. Coordination normal.  Skin: Skin is warm and dry. She is not diaphoretic.  Psychiatric: She has a normal mood and affect. Her behavior is normal.  Nursing note and vitals reviewed.  Assessment & Plan:  Acute maxillary sinusitis, recurrence not specified --poor resp to zith --sounds like AR underlying Meds ordered this encounter  Medications  . amoxicillin (AMOXIL) 875 MG tablet    Sig: Take 1 tablet (875 mg total) by mouth 2 (two) times daily.    Dispense:  20 tablet    Refill:  0  decongestants/nasal steroids  I have completed the patient encounter in its entirety as documented by the scribe, with editing by me where necessary. Robert P. Laney Pastor, M.D.

## 2015-07-10 NOTE — Patient Instructions (Signed)
mucinex Mint//menthol

## 2015-07-13 ENCOUNTER — Telehealth: Payer: Self-pay

## 2015-07-13 NOTE — Telephone Encounter (Signed)
Patient called to check on her dermatology referral to Dr Allyson Sabal.  She said Dr Laney Pastor told her he would have to call and speak to a physician to get her in urgently to check for possible skin cancer. She said she never received a call for an appointment from Dr Ledell Peoples office, so she was calling us to follow up.  I checked on the referral, and there are no notes regarding this urgency or a physician-to-physician discussion.  The referral and related OV was with Dr Everlene Farrier, but the patient insists that Dr Laney Pastor was the provider that discussed this with her.  Please advise.  Thank you.  CB#: [phone]

## 2015-07-14 NOTE — Telephone Encounter (Signed)
Referred 07/07/15 by Dr Daub---Dr Toy Cookey was closed the whole week so would not have this referral until today to even look at--as long as she gets in within4 weeks it will be ok

## 2015-07-15 ENCOUNTER — Telehealth: Payer: Self-pay

## 2015-07-15 NOTE — Telephone Encounter (Signed)
Patient is asking that someone call her and let her know the status of the dermatology referral

## 2015-07-15 NOTE — Telephone Encounter (Signed)
Regarding dermatology referral to Dr Allyson Sabal, see note from Dr Laney Pastor on 07/13/14.  "Referred 07/07/15 by Dr Daub---Dr Toy Cookey was closed the whole week so would not have this referral until today to even look at--as long as she gets in within 4 weeks it will be ok"  The projected appointments are in February, last I spoke to the patient regarding this referral.  Per Dr Laney Pastor, this time frame would be acceptable.

## 2015-07-22 NOTE — Telephone Encounter (Signed)
Patient is calling back because she is concerned about her appointment. She thought that maybe we had forgotten because she hasn't heard anything. She also states that we don't communicate with her. Informed patient that they will be the ones to contact her

## 2015-10-05 ENCOUNTER — Encounter: Payer: Self-pay | Admitting: Gastroenterology

## 2016-02-21 ENCOUNTER — Other Ambulatory Visit: Payer: Self-pay | Admitting: Family Medicine

## 2016-02-21 DIAGNOSIS — I10 Essential (primary) hypertension: Secondary | ICD-10-CM

## 2016-03-03 ENCOUNTER — Ambulatory Visit (INDEPENDENT_AMBULATORY_CARE_PROVIDER_SITE_OTHER): Payer: Medicare Other | Admitting: Family Medicine

## 2016-03-03 VITALS — BP 140/70 | HR 96 | Temp 99.0°F | Resp 16 | Ht 65.5 in | Wt 132.0 lb

## 2016-03-03 DIAGNOSIS — R0981 Nasal congestion: Secondary | ICD-10-CM

## 2016-03-03 DIAGNOSIS — J069 Acute upper respiratory infection, unspecified: Secondary | ICD-10-CM

## 2016-03-03 DIAGNOSIS — J301 Allergic rhinitis due to pollen: Secondary | ICD-10-CM | POA: Diagnosis not present

## 2016-03-03 DIAGNOSIS — J302 Other seasonal allergic rhinitis: Secondary | ICD-10-CM

## 2016-03-03 MED ORDER — BENZONATATE 100 MG PO CAPS: 100.0000 mg | ORAL_CAPSULE | Freq: Three times a day (TID) | ORAL | 0 refills | Status: DC | PRN

## 2016-03-03 MED ORDER — DEXTROMETHORPHAN POLISTIREX ER 30 MG/5ML PO SUER: 15.0000 mg | Freq: Two times a day (BID) | ORAL | 0 refills | Status: DC | PRN

## 2016-03-03 MED ORDER — LORATADINE 10 MG PO TABS: 10.0000 mg | ORAL_TABLET | Freq: Every day | ORAL | 11 refills | Status: DC

## 2016-03-03 MED ORDER — GUAIFENESIN ER 600 MG PO TB12: 600.0000 mg | ORAL_TABLET | Freq: Two times a day (BID) | ORAL | 1 refills | Status: DC | PRN

## 2016-03-03 MED ORDER — FLUTICASONE PROPIONATE 50 MCG/ACT NA SUSP: 2.0000 | Freq: Every day | NASAL | 12 refills | Status: DC

## 2016-03-03 NOTE — Patient Instructions (Addendum)
     IF you received an x-ray today, you will receive an invoice from [facility]. Please contact [facility] at [phone] with questions or concerns regarding your invoice.   IF you received labwork today, you will receive an invoice from [employer]. Please contact [facility] at [phone] with questions or concerns regarding your invoice.   Our billing staff will not be able to assist you with questions regarding bills from these companies.  You will be contacted with the lab results as soon as they are available. The fastest way to get your results is to activate your My Chart account. Instructions are located on the last page of this paperwork. If you have not heard from [place] regarding the results in 2 weeks, please contact this office.      Hay Fever Hay fever is an allergic reaction to particles in the air. It cannot be passed from person to person. It cannot be cured, but it can be controlled. CAUSES  Hay fever is caused by something that triggers an allergic reaction (allergens). The following are examples of allergens:  Ragweed.  Feathers.  Animal dander.  Grass and tree pollens.  Cigarette smoke.  House dust.  Pollution. SYMPTOMS   Sneezing.  Runny or stuffy nose.  Tearing eyes.  Itchy eyes, nose, mouth, throat, skin, or other area.  Sore throat.  Headache.  Decreased sense of smell or taste. DIAGNOSIS Your caregiver will perform a physical exam and ask questions about the symptoms you are having.Allergy testing may be done to determine exactly what triggers your hay fever.  TREATMENT   Over-the-counter medicines may help symptoms. These include:  Antihistamines.  Decongestants. These may help with nasal congestion.  Your caregiver may prescribe medicines if over-the-counter medicines do not work.  Some people benefit from allergy shots when other medicines are not helpful. HOME CARE INSTRUCTIONS    Avoid the allergen that is causing your symptoms, if possible.  Take all medicine as told by your caregiver. SEEK MEDICAL CARE IF:   You have severe allergy symptoms and your current medicines are not helping.  Your treatment was working at one time, but you are now experiencing symptoms.  You have sinus congestion and pressure.  You develop a fever or headache.  You have thick nasal discharge.  You have asthma and have a worsening cough and wheezing. SEEK IMMEDIATE MEDICAL CARE IF:   You have swelling of your tongue or lips.  You have trouble breathing.  You feel lightheaded or like you are going to faint.  You have cold sweats.  You have a fever.   This information is not intended to replace advice given to you by your health care provider. Make sure you discuss any questions you have with your health care provider.   Document Released: 06/26/2005 Document Revised: 09/18/2011 Document Reviewed: 01/06/2015 Elsevier Interactive Patient Education [employer].

## 2016-03-04 ENCOUNTER — Ambulatory Visit (INDEPENDENT_AMBULATORY_CARE_PROVIDER_SITE_OTHER): Payer: Medicare Other | Admitting: Family Medicine

## 2016-03-04 VITALS — BP 122/72 | HR 98 | Temp 98.2°F | Resp 17 | Ht 65.5 in | Wt 132.0 lb

## 2016-03-04 DIAGNOSIS — J069 Acute upper respiratory infection, unspecified: Secondary | ICD-10-CM | POA: Diagnosis not present

## 2016-03-04 MED ORDER — AZITHROMYCIN 250 MG PO TABS: ORAL_TABLET | ORAL | 0 refills | Status: DC

## 2016-03-04 NOTE — Progress Notes (Signed)
By signing my name below I, Tereasa Coop, attest that this documentation has been prepared under the direction and in the presence of [name], MD. Electonically Signed. Tereasa Coop, Scribe 03/04/2016 at 1:01 PM  Subjective:    Patient ID: Ruth Walker, female    DOB: [birth year], 70 y.o.   MRN: [id number]  Chief Complaint  Patient presents with  . Follow-up    chest tightness, cough     HPI Ruth Walker is a 70 y.o. female who presents to the [facility] for follow up reevaluation of cough. Pt was seen and evaluated yesterday at [facility] by Dr Brigitte Pulse for cough with a mildly sore throat. Pt states cough is worse today and has developed a chest tightness. Pt denies any CP, fever, HA, rash, fatigue, or malaise. Pt states that she did not cough much last night after taking her prescribed medicine. Pt has been taking mucinex with great relief.   Pt was seen by allergist and had her allergy shot yesterday.  Today is pt's 51th anniversary.    Patient Active Problem List   Diagnosis Date Noted  . External hemorrhoids 03/04/2012  . DVT (deep venous thrombosis) ([place])   . Uterine polyp   . Osteoporosis   . Vertebral compression fracture ([place])   . IBS (irritable bowel syndrome) 09/05/2011  . Constipation, slow transit 09/05/2011  . Anxiety disorder 09/05/2011  . Diverticulosis 11/04/2010  . Gallstones 11/04/2010  . ANAL FISSURE 05/26/2008  . OBSESSIVE-COMPULSIVE PERSONALITY DISORDER 02/28/2008  . HYPERTENSION 02/28/2008  . HEMORRHOIDS 02/28/2008  . ASTHMA 02/28/2008  . CYSTITIS, CHRONIC 02/28/2008    Current Outpatient Prescriptions on File Prior to Visit  Medication Sig Dispense Refill  . ALPRAZolam (XANAX) 0.25 MG tablet Take 1 tablet (0.25 mg total) by mouth 2 (two) times daily as needed for anxiety. 60 tablet 2  . aspirin 81 MG tablet Take 81 mg by mouth daily.      . benzonatate (TESSALON) 100 MG capsule Take 1 capsule (100 mg total) by mouth 3 (three)  times daily as needed for cough. 20 capsule 0  . Calcium Carbonate (CALCIUM 600 PO) Take 1 tablet by mouth 2 (two) times daily.    . cycloSPORINE (RESTASIS) 0.05 % ophthalmic emulsion 1 drop 2 (two) times daily.      Marland Kitchen estradiol (ESTRACE) 0.1 MG/GM vaginal cream Place [id number] Applicatorfuls vaginally 2 (two) times a week. 42.5 g 8  . fluticasone (FLONASE) 50 MCG/ACT nasal spray Place 2 sprays into both nostrils daily. 16 g 12  . guaiFENesin (MUCINEX) 600 MG 12 hr tablet Take 1 tablet (600 mg total) by mouth 2 (two) times daily as needed for to loosen phlegm. 14 tablet 1  . hydrochlorothiazide (HYDRODIURIL) 25 MG tablet TAKE 1 TABLET BY MOUTH ONCE DAILY 90 tablet 0  . loratadine (CLARITIN) 10 MG tablet Take 1 tablet (10 mg total) by mouth daily. 30 tablet 11  . metoprolol tartrate (LOPRESSOR) 25 MG tablet TAKE 1 TABLET BY MOUTH TWICE A DAY 60 tablet 0  . polyethylene glycol (MIRALAX / GLYCOLAX) packet Take 17 g by mouth daily. Reported on 07/10/2015    . PRESCRIPTION MEDICATION Allergy injection once a week.[facility] [employer] [employer] [facility]    . Probiotic Product (ALIGN) 4 MG CAPS Take 1 capsule by mouth daily. 21 capsule 0  . triamcinolone cream (KENALOG) 0.1 % Apply 1 application topically 2 (two) times daily. 30 g 0  . Vitamin D, Ergocalciferol, (DRISDOL) 50000 UNITS CAPS  capsule Take 1 capsule (50,000 Units total) by mouth once a week. 15 capsule 3  . Wheat Dextrin (BENEFIBER DRINK MIX PO) Take 10 mLs by mouth every morning. Take 2 teaspoons every morning      . [DISCONTINUED] Linaclotide (LINZESS) 145 MCG CAPS Take 1 capsule by mouth daily. 8 capsule 0   No current facility-administered medications on file prior to visit.     Allergies  Allergen Reactions  . Aloe   . Benadryl [Diphenhydramine Hcl] Other (See Comments)    Makes her feel "hyper"  . Cephalexin     REACTION: coarse rash  . Ciprofloxacin   . Mucinex [Guaifenesin Er] Other (See Comments)    Makes her feel worse  . Shellfish  Allergy Hives  . Sudafed [Pseudoephedrine Hcl] Hives and Other (See Comments)    Keeps her awake  . Sulfonamide Derivatives     Depression screen [facility] 2/9 03/04/2016 03/03/2016 07/07/2015 06/28/2015 02/24/2015  Decreased Interest 0 0 0 0 0  Down, Depressed, Hopeless 0 0 0 0 0  PHQ - 2 Score 0 0 0 0 0  Altered sleeping - - - - -  Tired, decreased energy - - - - -  Change in appetite - - - - -  Feeling bad or failure about yourself  - - - - -  Trouble concentrating - - - - -  Moving slowly or fidgety/restless - - - - -  Suicidal thoughts - - - - -  PHQ-9 Score - - - - -  Difficult doing work/chores - - - - -       Review of Systems  Constitutional: Negative for fatigue and fever.  HENT: Positive for sore throat (very mild).   Eyes: Negative for visual disturbance.  Respiratory: Positive for cough and chest tightness (no pain).   Cardiovascular: Negative for chest pain.  Gastrointestinal: Negative for abdominal pain.  Genitourinary: Negative for dysuria and flank pain.  Musculoskeletal: Negative for back pain.  Skin: Negative for rash.  Neurological: Negative for headaches.  Psychiatric/Behavioral: The patient is not nervous/anxious.        Objective:   Physical Exam  Constitutional: She is oriented to person, place, and time. She appears well-developed and well-nourished. No distress.  HENT:  Head: Normocephalic and atraumatic.  Nose: Nose normal. No mucosal edema.  Mouth/Throat: Uvula is midline and oropharynx is clear and moist. No oropharyngeal exudate or posterior oropharyngeal erythema.  Eyes: Conjunctivae are normal. Pupils are equal, round, and reactive to light.  Neck: Neck supple.  Cardiovascular: Normal rate, regular rhythm and normal heart sounds.  Exam reveals no gallop and no friction rub.   No murmur heard. Pulmonary/Chest: Effort normal and breath sounds normal. No accessory muscle usage. She has no decreased breath sounds. She has no wheezes. She has no  rhonchi. She has no rales.  Musculoskeletal: Normal range of motion.  Neurological: She is alert and oriented to person, place, and time.  Skin: Skin is warm and dry.  Psychiatric: She has a normal mood and affect. Her behavior is normal.  Nursing note and vitals reviewed.    BP 122/72 (BP Location: Right Arm, Patient Position: Sitting, Cuff Size: Normal)   Pulse 98   Temp 98.2 F (36.8 C) (Oral)   Resp 17   Ht 5' 5.5" (1.664 m)   Wt 132 lb (59.9 kg)   SpO2 99%   BMI 21.63 kg/m       Assessment & Plan:   1. Acute URI  Cont current regimen rx'ed yest (mucinex 400 bid, tessalon pearles 100 prn (pt using only qhs). Could not afford flonase but will start in a few d. Gave snap rx for zpack in case sxs do not improve in the next few d. Cont claritin, got allergy shot yest.    Meds ordered this encounter  Medications  . azithromycin (ZITHROMAX) 250 MG tablet    Sig: Take 2 tabs PO x 1 dose, then 1 tab PO QD x 4 days    Dispense:  6 tablet    Refill:  0    I personally performed the services described in this documentation, which was scribed in my presence. The recorded information has been reviewed and considered, and addended by me as needed.   [name], M.D.  [facility] [address] [place], [place] [address] [phone] phone [phone] fax  03/04/16 1:41 PM

## 2016-03-04 NOTE — Progress Notes (Signed)
By signing my name below I, Tereasa Coop, attest that this documentation has been prepared under the direction and in the presence of [name], MD. Electonically Signed. Tereasa Coop, Scribe 03/04/2016 at 10:08 AM  Subjective:    Patient ID: Ruth Walker, female    DOB: 1946/06/22, 70 y.o.   MRN: [id number]  Chief Complaint  Patient presents with  . Cough    check sinus    Cough  Associated symptoms include a sore throat (very mild). Pertinent negatives include no chest pain, fever, headaches or rash.   Ruth Walker is a 70 y.o. female who presents to the [facility] complaining of cough that is productive in the mornings with yellow phlegm for the past 3 days. Pt also reports sinus congestion and a "tickle in the back of [her] throat". Pt denies fever, fatigue, or malaise.   Pt has been out of her flonase and has been doing well without it. Pt has been taking Claritin to control allergy symptoms and has been out of it as well.   Pt states that she is overdue for her next allergy shot and is planning on getting it later today.   Patient Active Problem List   Diagnosis Date Noted  . External hemorrhoids 03/04/2012  . DVT (deep venous thrombosis) ([place])   . Uterine polyp   . Osteoporosis   . Vertebral compression fracture ([place])   . IBS (irritable bowel syndrome) 09/05/2011  . Constipation, slow transit 09/05/2011  . Anxiety disorder 09/05/2011  . Diverticulosis 11/04/2010  . Gallstones 11/04/2010  . ANAL FISSURE 05/26/2008  . OBSESSIVE-COMPULSIVE PERSONALITY DISORDER 02/28/2008  . HYPERTENSION 02/28/2008  . HEMORRHOIDS 02/28/2008  . ASTHMA 02/28/2008  . CYSTITIS, CHRONIC 02/28/2008    Current Outpatient Prescriptions on File Prior to Visit  Medication Sig Dispense Refill  . ALPRAZolam (XANAX) 0.25 MG tablet Take 1 tablet (0.25 mg total) by mouth 2 (two) times daily as needed for anxiety. 60 tablet 2  . aspirin 81 MG tablet Take 81 mg by mouth  daily.      . Calcium Carbonate (CALCIUM 600 PO) Take 1 tablet by mouth 2 (two) times daily.    . cycloSPORINE (RESTASIS) 0.05 % ophthalmic emulsion 1 drop 2 (two) times daily.      Marland Kitchen estradiol (ESTRACE) 0.1 MG/GM vaginal cream Place [id number] Applicatorfuls vaginally 2 (two) times a week. 42.5 g 8  . hydrochlorothiazide (HYDRODIURIL) 25 MG tablet TAKE 1 TABLET BY MOUTH ONCE DAILY 90 tablet 0  . metoprolol tartrate (LOPRESSOR) 25 MG tablet TAKE 1 TABLET BY MOUTH TWICE A DAY 60 tablet 0  . polyethylene glycol (MIRALAX / GLYCOLAX) packet Take 17 g by mouth daily. Reported on 07/10/2015    . PRESCRIPTION MEDICATION Allergy injection once a week.[facility] [employer]    . Probiotic Product (ALIGN) 4 MG CAPS Take 1 capsule by mouth daily. 21 capsule 0  . Vitamin D, Ergocalciferol, (DRISDOL) 50000 UNITS CAPS capsule Take 1 capsule (50,000 Units total) by mouth once a week. 15 capsule 3  . Wheat Dextrin (BENEFIBER DRINK MIX PO) Take 10 mLs by mouth every morning. Take 2 teaspoons every morning      . triamcinolone cream (KENALOG) 0.1 % Apply 1 application topically 2 (two) times daily. 30 g 0  . [DISCONTINUED] Linaclotide (LINZESS) 145 MCG CAPS Take 1 capsule by mouth daily. 8 capsule 0   No current facility-administered medications on file prior to visit.     Allergies  Allergen Reactions  . Aloe   . Benadryl [Diphenhydramine Hcl] Other (See Comments)    Makes her feel "hyper"  . Cephalexin     REACTION: coarse rash  . Ciprofloxacin   . Mucinex [Guaifenesin Er] Other (See Comments)    Makes her feel worse  . Shellfish Allergy Hives  . Sudafed [Pseudoephedrine Hcl] Hives and Other (See Comments)    Keeps her awake  . Sulfonamide Derivatives     Depression screen [facility] 2/9 03/04/2016 03/03/2016 07/07/2015 06/28/2015 02/24/2015  Decreased Interest 0 0 0 0 0  Down, Depressed, Hopeless 0 0 0 0 0  PHQ - 2 Score 0 0 0 0 0  Altered sleeping - - - - -  Tired, decreased energy - - - - -    Change in appetite - - - - -  Feeling bad or failure about yourself  - - - - -  Trouble concentrating - - - - -  Moving slowly or fidgety/restless - - - - -  Suicidal thoughts - - - - -  PHQ-9 Score - - - - -  Difficult doing work/chores - - - - -       Review of Systems  Constitutional: Negative for fever.  HENT: Positive for congestion and sore throat (very mild).   Eyes: Negative for visual disturbance.  Respiratory: Positive for cough.   Cardiovascular: Negative for chest pain.  Gastrointestinal: Negative for abdominal pain.  Genitourinary: Negative for dysuria.  Musculoskeletal: Negative for back pain.  Skin: Negative for rash.  Neurological: Negative for headaches.  Psychiatric/Behavioral: Negative for agitation.       Objective:   Physical Exam  Constitutional: She is oriented to person, place, and time. She appears well-developed and well-nourished. No distress.  HENT:  Head: Normocephalic and atraumatic.  Right Ear: Hearing, tympanic membrane, external ear and ear canal normal.  Left Ear: Hearing, tympanic membrane, external ear and ear canal normal.  Nose: Mucosal edema (mild) present.  Mouth/Throat: Oropharynx is clear and moist and mucous membranes are normal. No oropharyngeal exudate or posterior oropharyngeal erythema.  Eyes: Conjunctivae are normal. Pupils are equal, round, and reactive to light.  Neck: Neck supple.  Cardiovascular: Normal rate, regular rhythm and normal heart sounds.  Exam reveals no gallop and no friction rub.   No murmur heard. Pulmonary/Chest: Effort normal. No accessory muscle usage. No respiratory distress. She has no decreased breath sounds. She has no wheezes. She has no rhonchi. She has rales (expiratory, bilat bases, clear with repeated deep breathing).  Musculoskeletal: Normal range of motion.  Lymphadenopathy:    She has no cervical adenopathy.       Right: No supraclavicular adenopathy present.       Left: No supraclavicular  adenopathy present.  Neurological: She is alert and oriented to person, place, and time.  Skin: Skin is warm and dry.  Psychiatric: She has a normal mood and affect. Her behavior is normal.  Nursing note and vitals reviewed.   BP 140/70 (BP Location: Right Arm, Patient Position: Sitting, Cuff Size: Normal)   Pulse 96   Temp 99 F (37.2 C) (Oral)   Resp 16   Ht 5' 5.5" (1.664 m)   Wt 132 lb (59.9 kg)   SpO2 98%   BMI 21.63 kg/m        Assessment & Plan:   1. Acute upper respiratory infection   2. Hay fever   3. Nasal congestion   4. Seasonal allergic rhinitis  Meds ordered this encounter  Medications  . fluticasone (FLONASE) 50 MCG/ACT nasal spray    Sig: Place 2 sprays into both nostrils daily.    Dispense:  16 g    Refill:  12  . loratadine (CLARITIN) 10 MG tablet    Sig: Take 1 tablet (10 mg total) by mouth daily.    Dispense:  30 tablet    Refill:  11  . guaiFENesin (MUCINEX) 600 MG 12 hr tablet    Sig: Take 1 tablet (600 mg total) by mouth 2 (two) times daily as needed for to loosen phlegm.    Dispense:  14 tablet    Refill:  1  . dextromethorphan (DELSYM) 30 MG/5ML liquid    Sig: Take 2.5 mLs (15 mg total) by mouth 2 (two) times daily as needed for cough.    Dispense:  89 mL    Refill:  0  . benzonatate (TESSALON) 100 MG capsule    Sig: Take 1 capsule (100 mg total) by mouth 3 (three) times daily as needed for cough.    Dispense:  20 capsule    Refill:  0    I personally performed the services described in this documentation, which was scribed in my presence. The recorded information has been reviewed and considered, and addended by me as needed.   [name], M.D.  [facility] [address] [place], [place] [address] [phone] phone [phone] fax  03/04/16 12:34 PM

## 2016-03-04 NOTE — Patient Instructions (Signed)
     IF you received an x-ray today, you will receive an invoice from [facility]. Please contact [facility] at [phone] with questions or concerns regarding your invoice.   IF you received labwork today, you will receive an invoice from [facility]. Please contact [facility] at [phone] with questions or concerns regarding your invoice.   Our billing staff will not be able to assist you with questions regarding bills from these companies.  You will be contacted with the lab results as soon as they are available. The fastest way to get your results is to activate your My Chart account. Instructions are located on the last page of this paperwork. If you have not heard from us regarding the results in 2 weeks, please contact this office.      

## 2016-03-07 ENCOUNTER — Encounter: Payer: Self-pay | Admitting: Family Medicine

## 2016-03-07 ENCOUNTER — Ambulatory Visit (INDEPENDENT_AMBULATORY_CARE_PROVIDER_SITE_OTHER): Payer: Medicare Other | Admitting: Family Medicine

## 2016-03-07 VITALS — BP 120/90 | HR 75 | Temp 98.0°F | Resp 16 | Ht 65.75 in | Wt 131.8 lb

## 2016-03-07 DIAGNOSIS — R05 Cough: Secondary | ICD-10-CM | POA: Diagnosis not present

## 2016-03-07 DIAGNOSIS — R0982 Postnasal drip: Secondary | ICD-10-CM | POA: Diagnosis not present

## 2016-03-07 DIAGNOSIS — R058 Other specified cough: Secondary | ICD-10-CM

## 2016-03-07 DIAGNOSIS — J040 Acute laryngitis: Secondary | ICD-10-CM

## 2016-03-07 DIAGNOSIS — J069 Acute upper respiratory infection, unspecified: Secondary | ICD-10-CM

## 2016-03-07 MED ORDER — IPRATROPIUM BROMIDE 0.03 % NA SOLN
2.0000 | Freq: Four times a day (QID) | NASAL | 1 refills | Status: DC | PRN
Start: 2016-03-07 — End: 2016-04-04

## 2016-03-07 NOTE — Patient Instructions (Addendum)
I think your cough, laryngitis, and throat irritations is coming from post-nasal drip and upper airway cough syndrome (UACS), previously referred to as postnasal drip syndrome (PNDS).  Most likely this is Classic Upper airway cough syndrome, so named because it's frequently impossible to sort out how much is nasal congestion/post-nasal drip with freq throat clearing.  Try at night allergy medications like claritin (loratadine) since this is when cough is the worst. If things are not improving, call so we can try an oral prednisone taper but watch out to not catch any other illness as it can suppress your immune system. Suppress your cough and throat clearing with sugar-free hard candy, hot tea with honey, and tessalon pearles, not throat lozenges.     IF you received an x-ray today, you will receive an invoice from [facility]. Please contact [facility] at [phone] with questions or concerns regarding your invoice.   IF you received labwork today, you will receive an invoice from [facility]. Please contact [facility] at [phone] with questions or concerns regarding your invoice.   Our billing staff will not be able to assist you with questions regarding bills from these companies.  You will be contacted with the lab results as soon as they are available. The fastest way to get your results is to activate your My Chart account. Instructions are located on the last page of this paperwork. If you have not heard from [place] regarding the results in 2 weeks, please contact this office.     Laryngitis Laryngitis is inflammation of your vocal cords. This causes hoarseness, coughing, loss of voice, sore throat, or a dry throat. Your vocal cords are two bands of muscles that are found in your throat. When you speak, these cords come together and vibrate. These vibrations come out through your mouth as sound. When your vocal cords are inflamed, your voice sounds  different. Laryngitis can be temporary (acute) or long-term (chronic). Most cases of acute laryngitis improve with time. Chronic laryngitis is laryngitis that lasts for more than three weeks. CAUSES Acute laryngitis may be caused by:  A viral infection.  Lots of talking, yelling, or singing. This is also called vocal strain.  Bacterial infections. Chronic laryngitis may be caused by:  Vocal strain.  Injury to your vocal cords.  Acid reflux (gastroesophageal reflux disease or GERD).  Allergies.  Sinus infection.  Smoking.  Alcohol abuse.  Breathing in chemicals or dust.  Growths on the vocal cords. RISK FACTORS Risk factors for laryngitis include:  Smoking.  Alcohol abuse.  Having allergies. SIGNS AND SYMPTOMS Symptoms of laryngitis may include:  Low, hoarse voice.  Loss of voice.  Dry cough.  Sore throat.  Stuffy nose. DIAGNOSIS Laryngitis may be diagnosed by:  Physical exam.  Throat culture.  Blood test.  Laryngoscopy. This procedure allows your health care provider to look at your vocal cords with a mirror or viewing tube. TREATMENT Treatment for laryngitis depends on what is causing it. Usually, treatment involves resting your voice and using medicines to soothe your throat. However, if your laryngitis is caused by a bacterial infection, you may need to take antibiotic medicine. If your laryngitis is caused by a growth, you may need to have a procedure to remove it. HOME CARE INSTRUCTIONS  Drink enough fluid to keep your urine clear or pale yellow.  Breathe in moist air. Use a humidifier if you live in a dry climate.  Take medicines only as directed by your health care provider.  If you were prescribed an antibiotic medicine, finish it all even if you start to feel better.  Do not smoke cigarettes or electronic cigarettes. If you need help quitting, ask your health care provider.  Talk as little as possible. Also avoid whispering, which can  cause vocal strain.  Write instead of talking. Do this until your voice is back to normal. SEEK MEDICAL CARE IF:  You have a fever.  You have increasing pain.  You have difficulty swallowing. SEEK IMMEDIATE MEDICAL CARE IF:  You cough up blood.  You have trouble breathing.   This information is not intended to replace advice given to you by your health care provider. Make sure you discuss any questions you have with your health care provider.   Document Released: 06/26/2005 Document Revised: 07/17/2014 Document Reviewed: 12/09/2013 Elsevier Interactive Patient Education [employer].

## 2016-03-08 NOTE — Progress Notes (Signed)
Subjective:  By signing my name below, I, Ruth Walker, attest that this documentation has been prepared under the direction and in the presence of Ruth Cheadle, MD. Electronically Signed: Moises Walker, [place]. 03/08/2016 , 9:06 AM .  Patient was seen in Room 10 .   Patient ID: Ruth Walker, female    DOB: 05-05-1946, 70 y.o.   MRN: [id number] Chief Complaint  Patient presents with  . Cough    "can't sleep"   Cough  Associated symptoms include chills and a sore throat. Pertinent negatives include no fever, shortness of breath or wheezing.   Ruth Walker is a 69 y.o. female who presents to [facility] complaining of cough. She hasn't been able to sleep. She was seen 5 and 4 days prior for URI symptoms. She was 50th wedding anniversary so she was treating it aggressively. She is sensitive to medication side effects but willing to start mucinex bid, tessalon qhs, and claritin. She was to start flonase but couldn't afford. At her second visit, she was given prescription for zpak and can fill if there's no improvement over several days.   She has started her zpak, currently on 3rd day dose. She is also still taking mucinex bid, tessalon qhs and claritin. She notes her throat still feels itchy with a "tickle". She reports usually becoming anxious when she is sick, and has trouble falling asleep with some heaviness in her throat. She's also been using lozenges for mild relief. She's had chills last night with sore throat. She denies loss of appetite. She informs scraping "black gunk" from the sides of her sink recently, as she plans to sell her house.   Past Medical History:  Diagnosis Date  . Allergy   . Anal fissure   . Anxiety states   . Asthma    Gets allergy shots once a week   . Cholelithiasis   . Depression   . Diverticulosis   . Environmental allergies   . Hemorrhoids   . Irritable bowel syndrome (IBS)   . Osteoporosis   . Skin cancer    Leg  . Uterine polyp   . Vertebral  compression fracture ([place])    Prior to Admission medications   Medication Sig Start Date End Date Taking? Authorizing Provider  ALPRAZolam (XANAX) 0.25 MG tablet Take 1 tablet (0.25 mg total) by mouth 2 (two) times daily as needed for anxiety. 06/23/15   [name], MD  aspirin 81 MG tablet Take 81 mg by mouth daily.      Historical Provider, MD  azithromycin (ZITHROMAX) 250 MG tablet Take 2 tabs PO x 1 dose, then 1 tab PO QD x 4 days 03/04/16   Shawnee Knapp, MD  benzonatate (TESSALON) 100 MG capsule Take 1 capsule (100 mg total) by mouth 3 (three) times daily as needed for cough. 03/03/16   Shawnee Knapp, MD  Calcium Carbonate (CALCIUM 600 PO) Take 1 tablet by mouth 2 (two) times daily.    Historical Provider, MD  cycloSPORINE (RESTASIS) 0.05 % ophthalmic emulsion 1 drop 2 (two) times daily.      Historical Provider, MD  estradiol (ESTRACE) 0.1 MG/GM vaginal cream Place [id number] Applicatorfuls vaginally 2 (two) times a week. 02/24/15   Roselee Culver, MD  fluticasone (FLONASE) 50 MCG/ACT nasal spray Place 2 sprays into both nostrils daily. 03/03/16   Shawnee Knapp, MD  guaiFENesin (MUCINEX) 600 MG 12 hr tablet Take 1 tablet (600 mg total) by mouth 2 (two) times daily  as needed for to loosen phlegm. 03/03/16   Shawnee Knapp, MD  hydrochlorothiazide (HYDRODIURIL) 25 MG tablet TAKE 1 TABLET BY MOUTH ONCE DAILY 02/22/16   Chelle Jeffery, PA-C  loratadine (CLARITIN) 10 MG tablet Take 1 tablet (10 mg total) by mouth daily. 03/03/16   Shawnee Knapp, MD  metoprolol tartrate (LOPRESSOR) 25 MG tablet TAKE 1 TABLET BY MOUTH TWICE A DAY 02/22/16   Chelle Jeffery, PA-C  polyethylene glycol (MIRALAX / GLYCOLAX) packet Take 17 g by mouth daily. Reported on 07/10/2015    Historical Provider, MD  PRESCRIPTION MEDICATION Allergy injection once a week.Marland KitchenMarland [facility] [employer], MD  Probiotic Product (ALIGN) 4 MG CAPS Take 1 capsule by mouth daily. 07/05/11   Sable Feil, MD  triamcinolone cream  (KENALOG) 0.1 % Apply 1 application topically 2 (two) times daily. 06/25/15   Mancel Bale, PA-C  Vitamin D, Ergocalciferol, (DRISDOL) 50000 UNITS CAPS capsule Take 1 capsule (50,000 Units total) by mouth once a week. 02/24/15   Roselee Culver, MD  Wheat Dextrin (BENEFIBER DRINK MIX PO) Take 10 mLs by mouth every morning. Take 2 teaspoons every morning      Historical Provider, MD   Allergies  Allergen Reactions  . Aloe   . Benadryl [Diphenhydramine Hcl] Other (See Comments)    Makes her feel "hyper"  . Cephalexin     REACTION: coarse rash  . Ciprofloxacin   . Mucinex [Guaifenesin Er] Other (See Comments)    Makes her feel worse  . Shellfish Allergy Hives  . Sudafed [Pseudoephedrine Hcl] Hives and Other (See Comments)    Keeps her awake  . Sulfonamide Derivatives    Review of Systems  Constitutional: Positive for chills and fatigue. Negative for appetite change, diaphoresis and fever.  HENT: Positive for congestion and sore throat.   Respiratory: Positive for cough. Negative for shortness of breath and wheezing.   Gastrointestinal: Negative for diarrhea, nausea and vomiting.       Objective:   Physical Exam  Constitutional: She is oriented to person, place, and time. She appears well-developed and well-nourished. No distress.  HENT:  Head: Normocephalic and atraumatic.  Right Ear: Tympanic membrane normal.  Left Ear: Tympanic membrane normal.  Nose: Mucosal edema and rhinorrhea present.  Mouth/Throat: Posterior oropharyngeal erythema present.  Eyes: EOM are normal. Pupils are equal, round, and reactive to light.  Neck: Neck supple. No thyromegaly present.  Cardiovascular: Normal rate, regular rhythm, S1 normal, S2 normal and normal heart sounds.   No murmur heard. Pulmonary/Chest: Effort normal and breath sounds normal. No respiratory distress.  Musculoskeletal: Normal range of motion.  Lymphadenopathy:    She has cervical adenopathy (anterior).  Neurological: She is  alert and oriented to person, place, and time.  Skin: Skin is warm and dry.  Psychiatric: She has a normal mood and affect. Her behavior is normal.  Nursing note and vitals reviewed.   BP 120/90 (BP Location: Right Arm, Cuff Size: Normal)   Pulse 75   Temp 98 F (36.7 C) (Oral)   Resp 16   Ht 5' 5.75" (1.67 m)   Wt 131 lb 12.8 oz (59.8 kg)   SpO2 99%   BMI 21.44 kg/m     Assessment & Plan:   1. URI, acute   2. Postnasal drip   3. Upper airway cough syndrome   4. Laryngitis   This is pt's 3rd visit in 5 days for her URI sxs which began 1  week prior. Suspect viral but pt will finish her zpack course.  She is very sensitive to medication side effects, becoming easily sedated so will have her stop mucinex and try nasal saline spray instead.  She was not able to afford the fluticasone spray so will try below instead if the saline is not helping.  Cont tessalon qhs.  Use sugar-free hard candy and hot tea with honey to treat throat irritation and post-nasal drip bough. Discouraged prednisone use due to immunosuppression side effect but if pt is not better in another wk, she will call so we can reconsider.  Meds ordered this encounter  Medications  . ipratropium (ATROVENT) 0.03 % nasal spray    Sig: Place 2 sprays into the nose 4 (four) times daily as needed for rhinitis.    Dispense:  30 mL    Refill:  1    I personally performed the services described in this documentation, which was scribed in my presence. The recorded information has been reviewed and considered, and addended by me as needed.   Ruth Walker, M.D.  [facility] [address] [place], [place] [address] [phone] phone [phone] fax  03/08/16 1:02 PM

## 2016-03-09 ENCOUNTER — Ambulatory Visit (INDEPENDENT_AMBULATORY_CARE_PROVIDER_SITE_OTHER): Payer: Medicare Other | Admitting: Physician Assistant

## 2016-03-09 ENCOUNTER — Ambulatory Visit (INDEPENDENT_AMBULATORY_CARE_PROVIDER_SITE_OTHER): Payer: Medicare Other

## 2016-03-09 VITALS — BP 110/76 | HR 99 | Temp 98.7°F | Resp 16 | Ht 65.5 in | Wt 131.2 lb

## 2016-03-09 DIAGNOSIS — R059 Cough, unspecified: Secondary | ICD-10-CM

## 2016-03-09 DIAGNOSIS — R058 Other specified cough: Secondary | ICD-10-CM

## 2016-03-09 DIAGNOSIS — R05 Cough: Secondary | ICD-10-CM

## 2016-03-09 DIAGNOSIS — J069 Acute upper respiratory infection, unspecified: Secondary | ICD-10-CM | POA: Diagnosis not present

## 2016-03-09 MED ORDER — METHYLPREDNISOLONE ACETATE 80 MG/ML IJ SUSP
80.0000 mg | Freq: Once | INTRAMUSCULAR | Status: AC
  Administered 2016-03-09: 80 mg via INTRAMUSCULAR

## 2016-03-09 NOTE — Patient Instructions (Addendum)
Your chest xray did not show any cause for your persistent symptoms. Continue to take the claritin, lozenges, and fluids as you have been.  You received steroids today to try to help with the irritation from the cough.  Keep in mind this can increase your risk of infection with sick family members around. It is best to be extra cautious.  Please come back to see [place] if you're not better in one week or se your allergist.     IF you received an x-ray today, you will receive an invoice from [facility]. Please contact [facility] at [phone] with questions or concerns regarding your invoice.   IF you received labwork today, you will receive an invoice from [facility]. Please contact [facility] at [phone] with questions or concerns regarding your invoice.   Our billing staff will not be able to assist you with questions regarding bills from these companies.  You will be contacted with the lab results as soon as they are available. The fastest way to get your results is to activate your My Chart account. Instructions are located on the last page of this paperwork. If you have not heard from [place] regarding the results in 2 weeks, please contact this office.

## 2016-03-09 NOTE — Progress Notes (Signed)
Patient ID: Ruth Walker, female    DOB: Feb 08, 1946, 70 y.o.   MRN: [id number]  PCP: [name], MD  Chief Complaint  Patient presents with  . Follow-up    Subjective:   HPI:  19 yof presents for f/u.   Has been seen at [facility] 3 times in the past week for recurrent URI/allergic rhinitis/post nasal drip/upper airway cough syndrome/cough.   She has known allergies, sees an allergist Dr Donneta Romberg in the area where she receives weekly allergy injections. She was unable to get in to see him today. She was treated initially with claritin, mucinex, and tessalon. She was unable to afford a nasal spray. She presented back with continued symptoms. Then given z pack if symptoms did not improve. Started z pack and finished this yesterday. Returned for a 3rd visit several days ago as her nasal congestion, sore throat, mildly prod morning cough were not improved. Discussed option of prednisone at that time but have held off so far.   Pt has anxiety which she feels is worse lately due to upcoming family functions. She also has a grandson who has URI symptoms staying at her house currently. For these reasons did not do prednisone at last visit.   Today she presents with continued night time and am cough which is mildly productive. Continued head/ear congestion. Continued sore throat. She has completed a z pack, continued to take claritin and tessalon nightly. She has been using lozenges and honey with tea past few days which has helped a lot. She feels she is somewhat dried out today so has stopped taking the mucinex.   She also mentions chest tightness with her coughing spells. No tightness with activity or anxiety. No SOB. Chest tight only with coughing.   No new meds recently other than above. Does have GERD but has not been having any symptoms recently.   She states that she has had anxiety with po steroids in the past but never with Depo-Medrol. Hopeful for injectable steroid today.   Patient  Active Problem List   Diagnosis Date Noted  . External hemorrhoids 03/04/2012  . DVT (deep venous thrombosis) ([place])   . Uterine polyp   . Osteoporosis   . Vertebral compression fracture ([place])   . IBS (irritable bowel syndrome) 09/05/2011  . Constipation, slow transit 09/05/2011  . Anxiety disorder 09/05/2011  . Diverticulosis 11/04/2010  . Gallstones 11/04/2010  . ANAL FISSURE 05/26/2008  . OBSESSIVE-COMPULSIVE PERSONALITY DISORDER 02/28/2008  . HYPERTENSION 02/28/2008  . HEMORRHOIDS 02/28/2008  . ASTHMA 02/28/2008  . CYSTITIS, CHRONIC 02/28/2008    Past Medical History:  Diagnosis Date  . Allergy   . Anal fissure   . Anxiety states   . Asthma    Gets allergy shots once a week   . Cholelithiasis   . Depression   . Diverticulosis   . Environmental allergies   . Hemorrhoids   . Irritable bowel syndrome (IBS)   . Osteoporosis   . Skin cancer    Leg  . Uterine polyp   . Vertebral compression fracture ([place])      Prior to Admission medications   Medication Sig Start Date End Date Taking? Authorizing Provider  ALPRAZolam (XANAX) 0.25 MG tablet Take 1 tablet (0.25 mg total) by mouth 2 (two) times daily as needed for anxiety. 06/23/15  Yes [name], MD  aspirin 81 MG tablet Take 81 mg by mouth daily.     Yes Historical Provider, MD  azithromycin (ZITHROMAX) 250 MG tablet  Take 2 tabs PO x 1 dose, then 1 tab PO QD x 4 days 03/04/16  Yes Shawnee Knapp, MD  benzonatate (TESSALON) 100 MG capsule Take 1 capsule (100 mg total) by mouth 3 (three) times daily as needed for cough. 03/03/16  Yes Shawnee Knapp, MD  Calcium Carbonate (CALCIUM 600 PO) Take 1 tablet by mouth 2 (two) times daily.   Yes Historical Provider, MD  cycloSPORINE (RESTASIS) 0.05 % ophthalmic emulsion 1 drop 2 (two) times daily.     Yes Historical Provider, MD  estradiol (ESTRACE) 0.1 MG/GM vaginal cream Place [id number] Applicatorfuls vaginally 2 (two) times a week. 02/24/15  Yes Roselee Culver, MD  fluticasone  (FLONASE) 50 MCG/ACT nasal spray Place 2 sprays into both nostrils daily. 03/03/16  Yes Shawnee Knapp, MD  hydrochlorothiazide (HYDRODIURIL) 25 MG tablet TAKE 1 TABLET BY MOUTH ONCE DAILY 02/22/16  Yes Chelle Jeffery, PA-C  ipratropium (ATROVENT) 0.03 % nasal spray Place 2 sprays into the nose 4 (four) times daily as needed for rhinitis. 03/07/16  Yes Shawnee Knapp, MD  loratadine (CLARITIN) 10 MG tablet Take 1 tablet (10 mg total) by mouth daily. 03/03/16  Yes Shawnee Knapp, MD  metoprolol tartrate (LOPRESSOR) 25 MG tablet TAKE 1 TABLET BY MOUTH TWICE A DAY 02/22/16  Yes Chelle Jeffery, PA-C  polyethylene glycol (MIRALAX / GLYCOLAX) packet Take 17 g by mouth daily. Reported on 07/10/2015   Yes Historical Provider, MD  PRESCRIPTION MEDICATION Allergy injection once a week.[facility]land KitchenMarland [facility] [employer]   Yes Historical Provider, MD  Probiotic Product (ALIGN) 4 MG CAPS Take 1 capsule by mouth daily. 07/05/11  Yes Sable Feil, MD  triamcinolone cream (KENALOG) 0.1 % Apply 1 application topically 2 (two) times daily. 06/25/15  Yes Mancel Bale, PA-C  Vitamin D, Ergocalciferol, (DRISDOL) 50000 UNITS CAPS capsule Take 1 capsule (50,000 Units total) by mouth once a week. 02/24/15  Yes Roselee Culver, MD  Wheat Dextrin (BENEFIBER DRINK MIX PO) Take 10 mLs by mouth every morning. Take 2 teaspoons every morning     Yes Historical Provider, MD    Allergies  Allergen Reactions  . Aloe   . Benadryl [Diphenhydramine Hcl] Other (See Comments)    Makes her feel "hyper"  . Cephalexin     REACTION: coarse rash  . Ciprofloxacin   . Mucinex [Guaifenesin Er] Other (See Comments)    Makes her feel worse  . Shellfish Allergy Hives  . Sudafed [Pseudoephedrine Hcl] Hives and Other (See Comments)    Keeps her awake  . Sulfonamide Derivatives     Past Surgical History:  Procedure Laterality Date  . BACK SURGERY    . CESAREAN SECTION    . DILATION AND CURETTAGE OF UTERUS    . Endometrial polypectomy    .  FRACTURE SURGERY     neck (vertebrae)  . HEMORRHOID SURGERY    . HYSTEROSCOPY    . IBS    . MOUTH SURGERY    . SKIN CANCER EXCISION     Leg     Family History  Problem Relation Age of Onset  . Diabetes Mother   . Hypertension Mother   . Thyroid disease Mother   . Colon cancer Neg Hx   . Hypertension Father   . Heart disease Father   . Thyroid disease Sister     Social History   Social History  . Marital status: Married    Spouse name: N/A  . Number of children:  2  . Years of education: N/A   Occupational History  . Retired    Social History Main Topics  . Smoking status: Never Smoker  . Smokeless tobacco: Never Used  . Alcohol use No  . Drug use: No  . Sexual activity: Yes    Birth control/ protection: Post-menopausal     Comment: number of sex partners in the last 75 months  1   Other Topics Concern  . None   Social History Narrative   Patient gets regular exercise. Cares for her grandchildren.   Review of Systems  No fevers, chills. See HPI. No N/V.     Objective:  Physical Exam  Constitutional: She appears well-developed and well-nourished.  Non-toxic appearance. She does not have a sickly appearance. She does not appear ill. No distress.  HENT:  Right Ear: Tympanic membrane normal.  Left Ear: Tympanic membrane normal.  Nose: Mucosal edema and rhinorrhea present. Right sinus exhibits no maxillary sinus tenderness and no frontal sinus tenderness. Left sinus exhibits no maxillary sinus tenderness and no frontal sinus tenderness.  Mouth/Throat: Uvula is midline, oropharynx is clear and moist and mucous membranes are normal.  Cardiovascular: Normal rate, regular rhythm and normal heart sounds.   Pulmonary/Chest: Effort normal. No accessory muscle usage. No tachypnea. No respiratory distress. She has no decreased breath sounds. She has no wheezes. She has no rhonchi. She has rales in the right lower field.   8/31 CXR FINDINGS: Mediastinum hilar structures  normal. COPD. Lungs are clear. No pleural effusion or pneumothorax. Mild cardiomegaly. Degenerative changes thoracic spine. Pectus deformity.  IMPRESSION: No acute cardiopulmonary disease . COPD. Exam stable from 07/07/2015.  Assessment & Plan:   URI, acute  Upper airway cough syndrome - Plan: methylPREDNISolone acetate (DEPO-MEDROL) injection 80 mg  Cough - Plan: DG Chest 2 View, methylPREDNISolone acetate (DEPO-MEDROL) injection 80 mg -- no cxr findings to explain persistent cough - cxr with mild CM and COPD which is stable from 06/2015 -- continue to suspect viral URI with assoc upper airway cough syndrome as cause of symptoms  -- continue claritin, tessalon, lozenges, honey as they appear to be working  -- suspect sx will last another several days to week, pt understading -- discussed risks/benefits of steroids today - concern for increased risk of infection with family around and increased anxiety with steroids, she would like to proceed with steroids, prefers injectable which was given today -- rtc one week or to allergist if symptoms persist   Julieta Gutting, PA-C Physician Assistant-Certified [facility]  03/09/2016 11:33 AM

## 2016-03-26 ENCOUNTER — Other Ambulatory Visit: Payer: Self-pay | Admitting: Physician Assistant

## 2016-03-28 ENCOUNTER — Other Ambulatory Visit: Payer: Self-pay | Admitting: Physician Assistant

## 2016-03-28 NOTE — Telephone Encounter (Signed)
Pt is very upset and confused-she was just seen by dr Brigitte Pulse 4 times in one week, that she might need another appt for the metoprolol refill she only has two pills left and would like to know what to do next -she has an issue financially and really cant come back with copay right now   Would like enough of a refill til she can get in   Best number [phone]

## 2016-04-04 ENCOUNTER — Telehealth: Payer: Self-pay | Admitting: *Deleted

## 2016-04-04 ENCOUNTER — Ambulatory Visit (INDEPENDENT_AMBULATORY_CARE_PROVIDER_SITE_OTHER): Payer: Medicare Other | Admitting: Family Medicine

## 2016-04-04 VITALS — BP 122/60 | HR 79 | Temp 98.3°F | Resp 16 | Ht 65.5 in | Wt 130.4 lb

## 2016-04-04 DIAGNOSIS — M810 Age-related osteoporosis without current pathological fracture: Secondary | ICD-10-CM | POA: Diagnosis not present

## 2016-04-04 DIAGNOSIS — I10 Essential (primary) hypertension: Secondary | ICD-10-CM

## 2016-04-04 DIAGNOSIS — J309 Allergic rhinitis, unspecified: Secondary | ICD-10-CM | POA: Diagnosis not present

## 2016-04-04 DIAGNOSIS — J029 Acute pharyngitis, unspecified: Secondary | ICD-10-CM | POA: Diagnosis not present

## 2016-04-04 LAB — POCT RAPID STREP A (OFFICE): RAPID STREP A SCREEN: NEGATIVE

## 2016-04-04 MED ORDER — METOPROLOL TARTRATE 25 MG PO TABS: 25.0000 mg | ORAL_TABLET | Freq: Two times a day (BID) | ORAL | 1 refills | Status: DC

## 2016-04-04 MED ORDER — SALINE SPRAY 0.65 % NA SOLN: 1.0000 | NASAL | 11 refills | Status: DC

## 2016-04-04 MED ORDER — HYDROCHLOROTHIAZIDE 25 MG PO TABS: 25.0000 mg | ORAL_TABLET | Freq: Every day | ORAL | 0 refills | Status: DC

## 2016-04-04 MED ORDER — GUAIFENESIN ER 1200 MG PO TB12: 1.0000 | ORAL_TABLET | Freq: Two times a day (BID) | ORAL | 1 refills | Status: DC | PRN

## 2016-04-04 NOTE — Telephone Encounter (Signed)
Patient requested refill of her vitamin D.

## 2016-04-04 NOTE — Progress Notes (Signed)
By signing my name below, I, Mesha Guinyard, attest that this documentation has been prepared under the direction and in the presence of [name], MD.  Electronically Signed: Verlee Monte, Medical Scribe. 04/04/16. 11:36 AM.  Subjective:    Patient ID: Ruth Walker, female    DOB: 21-Aug-1945, 70 y.o.   MRN: [id number]  HPI Chief Complaint  Patient presents with  . Sore Throat    HPI Comments: Ruth Walker is a 70 y.o. female who presents to the [facility] complaining of sore throat onset 3 days. Pt reports nasal congestions with dark colored sputum, and rhinorrhea. Pt states she's fully recovered from last month. Pt wasn't given her allergy shot for 2 weeks before she received it 4 days ago. Pt is moving Friday (in 3 days) and she was cleaning her [place] cabinet which was very dusty 3 days ago. Pt states the dust flew in her face and she suspects it triggered her symptoms. Pt is stressed about getting everything packed since her husband is a hoarder and he's irritating her during he packing/moving process. Pt reports having 2 sick contacts with strep throat. Pt has been taking claritin, tessalon pearls BID in the evening, and xanax 2x a week. Pt denies using the salt and saline nasal spray, and atrovent.  Skin: Pt states she's been scratching and thinking since she's so nervous.  Bone Density: Pt was taken off of actonel by Dr. Phineas Real, her GYN, and he recommended her to have Prolia 60 mg subcutaneous every 6 months for osteoporosis. She is adamant that she doesn't want to continue treatment after seeing her mom suffer with osteoporosis. Dr. Federico Flake had a dexa scan done within the past 2 years. Pt has been off of her bone density medication.  Patient Active Problem List   Diagnosis Date Noted  . External hemorrhoids 03/04/2012  . DVT (deep venous thrombosis) ([place])   . Uterine polyp   . Osteoporosis   . Vertebral compression fracture ([place])   . IBS (irritable  bowel syndrome) 09/05/2011  . Constipation, slow transit 09/05/2011  . Anxiety disorder 09/05/2011  . Diverticulosis 11/04/2010  . Gallstones 11/04/2010  . ANAL FISSURE 05/26/2008  . OBSESSIVE-COMPULSIVE PERSONALITY DISORDER 02/28/2008  . HYPERTENSION 02/28/2008  . HEMORRHOIDS 02/28/2008  . ASTHMA 02/28/2008  . CYSTITIS, CHRONIC 02/28/2008   Past Medical History:  Diagnosis Date  . Allergy   . Anal fissure   . Anxiety states   . Asthma    Gets allergy shots once a week   . Cholelithiasis   . Depression   . Diverticulosis   . Environmental allergies   . Hemorrhoids   . Irritable bowel syndrome (IBS)   . Osteoporosis   . Skin cancer    Leg  . Uterine polyp   . Vertebral compression fracture [facility])    Past Surgical History:  Procedure Laterality Date  . BACK SURGERY    . CESAREAN SECTION    . DILATION AND CURETTAGE OF UTERUS    . Endometrial polypectomy    . FRACTURE SURGERY     neck (vertebrae)  . HEMORRHOID SURGERY    . HYSTEROSCOPY    . IBS    . MOUTH SURGERY    . SKIN CANCER EXCISION     Leg    Allergies  Allergen Reactions  . Aloe   . Benadryl [Diphenhydramine Hcl] Other (See Comments)    Makes her feel "hyper"  . Cephalexin     REACTION:  coarse rash  . Ciprofloxacin   . Mucinex [Guaifenesin Er] Other (See Comments)    Makes her feel worse  . Shellfish Allergy Hives  . Sudafed [Pseudoephedrine Hcl] Hives and Other (See Comments)    Itching and hives asthma  . Sulfonamide Derivatives    Prior to Admission medications   Medication Sig Start Date End Date Taking? Authorizing Provider  ALPRAZolam (XANAX) 0.25 MG tablet Take 1 tablet (0.25 mg total) by mouth 2 (two) times daily as needed for anxiety. 06/23/15  Yes [name], MD  aspirin 81 MG tablet Take 81 mg by mouth daily.     Yes Historical Provider, MD  azithromycin (ZITHROMAX) 250 MG tablet Take 2 tabs PO x 1 dose, then 1 tab PO QD x 4 days 03/04/16  Yes Shawnee Knapp, MD  benzonatate (TESSALON)  100 MG capsule Take 1 capsule (100 mg total) by mouth 3 (three) times daily as needed for cough. 03/03/16  Yes Shawnee Knapp, MD  Calcium Carbonate (CALCIUM 600 PO) Take 1 tablet by mouth 2 (two) times daily.   Yes Historical Provider, MD  cycloSPORINE (RESTASIS) 0.05 % ophthalmic emulsion 1 drop 2 (two) times daily.     Yes Historical Provider, MD  estradiol (ESTRACE) 0.1 MG/GM vaginal cream Place [id number] Applicatorfuls vaginally 2 (two) times a week. 02/24/15  Yes Roselee Culver, MD  fluticasone (FLONASE) 50 MCG/ACT nasal spray Place 2 sprays into both nostrils daily. 03/03/16  Yes Shawnee Knapp, MD  hydrochlorothiazide (HYDRODIURIL) 25 MG tablet TAKE 1 TABLET BY MOUTH ONCE DAILY 02/22/16  Yes Chelle Jeffery, PA-C  ipratropium (ATROVENT) 0.03 % nasal spray Place 2 sprays into the nose 4 (four) times daily as needed for rhinitis. 03/07/16  Yes Shawnee Knapp, MD  loratadine (CLARITIN) 10 MG tablet Take 1 tablet (10 mg total) by mouth daily. 03/03/16  Yes Shawnee Knapp, MD  metoprolol tartrate (LOPRESSOR) 25 MG tablet TAKE 1 TABLET BY MOUTH TWICE A DAY 02/22/16  Yes Chelle Jeffery, PA-C  polyethylene glycol (MIRALAX / GLYCOLAX) packet Take 17 g by mouth daily. Reported on 07/10/2015   Yes Historical Provider, MD  PRESCRIPTION MEDICATION Allergy injection once a week.[facility]   Yes Historical Provider, MD  Probiotic Product (ALIGN) 4 MG CAPS Take 1 capsule by mouth daily. 07/05/11  Yes Sable Feil, MD  triamcinolone cream (KENALOG) 0.1 % Apply 1 application topically 2 (two) times daily. 06/25/15  Yes Mancel Bale, PA-C  Vitamin D, Ergocalciferol, (DRISDOL) 50000 UNITS CAPS capsule Take 1 capsule (50,000 Units total) by mouth once a week. 02/24/15  Yes Roselee Culver, MD  Wheat Dextrin (BENEFIBER DRINK MIX PO) Take 10 mLs by mouth every morning. Take 2 teaspoons every morning     Yes Historical Provider, MD   Social History   Social History  . Marital status: Married    Spouse name:  N/A  . Number of children: 2  . Years of education: N/A   Occupational History  . Retired    Social History Main Topics  . Smoking status: Never Smoker  . Smokeless tobacco: Never Used  . Alcohol use No  . Drug use: No  . Sexual activity: Yes    Birth control/ protection: Post-menopausal     Comment: number of sex partners in the last 76 months  1   Other Topics Concern  . Not on file   Social History Narrative   Patient gets regular exercise. Cares for her  grandchildren.   Depression screen [facility] 2/9 04/04/2016 03/09/2016 03/07/2016 03/04/2016 03/03/2016  Decreased Interest 0 0 0 0 0  Down, Depressed, Hopeless 0 0 0 0 0  PHQ - 2 Score 0 0 0 0 0  Altered sleeping - - - - -  Tired, decreased energy - - - - -  Change in appetite - - - - -  Feeling bad or failure about yourself  - - - - -  Trouble concentrating - - - - -  Moving slowly or fidgety/restless - - - - -  Suicidal thoughts - - - - -  PHQ-9 Score - - - - -  Difficult doing work/chores - - - - -    Review of Systems  Constitutional: Positive for activity change (increase) and fatigue. Negative for appetite change, chills, fever and unexpected weight change.  HENT: Positive for congestion, rhinorrhea and sore throat. Negative for ear pain, facial swelling, sinus pressure and trouble swallowing.   Musculoskeletal: Positive for arthralgias.  Allergic/Immunologic: Positive for environmental allergies. Negative for immunocompromised state.  Neurological: Positive for weakness.  Psychiatric/Behavioral: Negative for dysphoric mood. The patient is nervous/anxious.    Objective:  Physical Exam  Constitutional: She appears well-developed and well-nourished. No distress.  HENT:  Head: Normocephalic and atraumatic.  Right Ear: External ear normal.  Left Ear: External ear normal.  Nose: Nose normal.  Mouth/Throat: Oropharynx is clear and moist. No oropharyngeal exudate.  Eyes: Conjunctivae are normal.  Neck: Neck supple. No  thyromegaly present.  Cardiovascular: Normal rate, regular rhythm, S1 normal, S2 normal and normal heart sounds.  Exam reveals no friction rub.   No murmur heard. Pulmonary/Chest: Effort normal and breath sounds normal. No respiratory distress. She has no wheezes. She has no rales.  Lymphadenopathy:    She has no cervical adenopathy.  Neurological: She is alert.  Skin: Skin is warm and dry. Rash noted. Rash is macular (approx 2 in diameter at the nape of her right neck).  Psychiatric: She has a normal mood and affect. Her behavior is normal.  Nursing note and vitals reviewed.  BP 122/60 (BP Location: Right Arm, Patient Position: Sitting, Cuff Size: Normal)   Pulse 79   Temp 98.3 F (36.8 C) (Oral)   Resp 16   Ht 5' 5.5" (1.664 m)   Wt 130 lb 6.4 oz (59.1 kg)   SpO2 99%   BMI 21.37 kg/m    Results for orders placed or performed in visit on 04/04/16  POCT rapid strep A  Result Value Ref Range   Rapid Strep A Screen Negative Negative   Assessment & Plan:  Future labs for pt to get prior to her annual wellness visit. Pt is going to schedule annual wellness exam with fasting labs and chronic medication refill before the end of the year 1. Acute pharyngitis, unspecified etiology   2. Allergic rhinitis, unspecified allergic rhinitis type   3. Osteoporosis   4. Essential hypertension     Orders Placed This Encounter  Procedures  . Culture, Group A Strep    Order Specific Question:   Source    Answer:   oropharynx  . POCT rapid strep A    Meds ordered this encounter  Medications  . sodium chloride (OCEAN) 0.65 % SOLN nasal spray    Sig: Place 1 spray into both nostrils every 2 (two) hours.    Dispense:  1 Bottle    Refill:  11  . Guaifenesin (MUCINEX MAXIMUM STRENGTH) 1200 MG TB12  Sig: Take 1 tablet (1,200 mg total) by mouth every 12 (twelve) hours as needed.    Dispense:  14 tablet    Refill:  1  . hydrochlorothiazide (HYDRODIURIL) 25 MG tablet    Sig: Take 1 tablet  (25 mg total) by mouth daily.    Dispense:  90 tablet    Refill:  0  . metoprolol tartrate (LOPRESSOR) 25 MG tablet    Sig: Take 1 tablet (25 mg total) by mouth 2 (two) times daily.    Dispense:  180 tablet    Refill:  1    I personally performed the services described in this documentation, which was scribed in my presence. The recorded information has been reviewed and considered, and addended by me as needed.   [name], M.D.  [facility] [address] [place], [place] [address] [phone] phone [phone] fax  04/09/16 11:51 PM

## 2016-04-04 NOTE — Patient Instructions (Signed)
Allergic Rhinitis Allergic rhinitis is when the mucous membranes in the nose respond to allergens. Allergens are particles in the air that cause your body to have an allergic reaction. This causes you to release allergic antibodies. Through a chain of events, these eventually cause you to release histamine into the blood stream. Although meant to protect the body, it is this release of histamine that causes your discomfort, such as frequent sneezing, congestion, and an itchy, runny nose.  CAUSES Seasonal allergic rhinitis (hay fever) is caused by pollen allergens that may come from grasses, trees, and weeds. Year-round allergic rhinitis (perennial allergic rhinitis) is caused by allergens such as house dust mites, pet dander, and mold spores. SYMPTOMS  Nasal stuffiness (congestion).  Itchy, runny nose with sneezing and tearing of the eyes. DIAGNOSIS Your health care provider can help you determine the allergen or allergens that trigger your symptoms. If you and your health care provider are unable to determine the allergen, skin or blood testing may be used. Your health care provider will diagnose your condition after taking your health history and performing a physical exam. Your health care provider may assess you for other related conditions, such as asthma, pink eye, or an ear infection. TREATMENT Allergic rhinitis does not have a cure, but it can be controlled by:  Medicines that block allergy symptoms. These may include allergy shots, nasal sprays, and oral antihistamines.  Avoiding the allergen. Hay fever may often be treated with antihistamines in pill or nasal spray forms. Antihistamines block the effects of histamine. There are over-the-counter medicines that may help with nasal congestion and swelling around the eyes. Check with your health care provider before taking or giving this medicine. If avoiding the allergen or the medicine prescribed do not work, there are many new medicines  your health care provider can prescribe. Stronger medicine may be used if initial measures are ineffective. Desensitizing injections can be used if medicine and avoidance does not work. Desensitization is when a patient is given ongoing shots until the body becomes less sensitive to the allergen. Make sure you follow up with your health care provider if problems continue. HOME CARE INSTRUCTIONS It is not possible to completely avoid allergens, but you can reduce your symptoms by taking steps to limit your exposure to them. It helps to know exactly what you are allergic to so that you can avoid your specific triggers. SEEK MEDICAL CARE IF:  You have a fever.  You develop a cough that does not stop easily (persistent).  You have shortness of breath.  You start wheezing.  Symptoms interfere with normal daily activities.   This information is not intended to replace advice given to you by your health care provider. Make sure you discuss any questions you have with your health care provider.   Document Released: 03/21/2001 Document Revised: 07/17/2014 Document Reviewed: 03/03/2013 Elsevier Interactive Patient Education 2016 [place] protect organs, store calcium, and anchor muscles. Good health habits, such as eating nutritious foods and exercising regularly, are important for maintaining healthy bones. They can also help to prevent a condition that causes bones to lose density and become weak and brittle (osteoporosis). WHY IS BONE MASS IMPORTANT? Bone mass refers to the amount of bone tissue that you have. The higher your bone mass, the stronger your bones. An important step toward having healthy bones throughout life is to have strong and dense bones during childhood. A young adult who has a high bone mass is more  likely to have a high bone mass later in life. Bone mass at its greatest it is called peak bone mass. A large decline in bone mass occurs in older adults. In  women, it occurs about the time of menopause. During this time, it is important to practice good health habits, because if more bone is lost than what is replaced, the bones will become less healthy and more likely to break (fracture). If you find that you have a low bone mass, you may be able to prevent osteoporosis or further bone loss by changing your diet and lifestyle. HOW CAN I FIND OUT IF MY BONE MASS IS LOW? Bone mass can be measured with an X-ray test that is called a bone mineral density (BMD) test. This test is recommended for all women who are age 54 or older. It may also be recommended for men who are age 28 or older, or for people who are more likely to develop osteoporosis due to:  Having bones that break easily.  Having a long-term disease that weakens bones, such as kidney disease or rheumatoid arthritis.  Having menopause earlier than normal.  Taking medicine that weakens bones, such as steroids, thyroid hormones, or hormone treatment for breast cancer or prostate cancer.  Smoking.  Drinking three or more alcoholic drinks each day. WHAT ARE THE NUTRITIONAL RECOMMENDATIONS FOR HEALTHY BONES? To have healthy bones, you need to get enough of the right minerals and vitamins. Most nutrition experts recommend getting these nutrients from the foods that you eat. Nutritional recommendations vary from person to person. Ask your health care provider what is healthy for you. Here are some general guidelines. Calcium Recommendations Calcium is the most important (essential) mineral for bone health. Most people can get enough calcium from their diet, but supplements may be recommended for people who are at risk for osteoporosis. Good sources of calcium include:  Dairy products, such as low-fat or nonfat milk, cheese, and yogurt.  Dark green leafy vegetables, such as bok choy and broccoli.  Calcium-fortified foods, such as orange juice, cereal, bread, soy beverages, and tofu  products.  Nuts, such as almonds. Follow these recommended amounts for daily calcium intake:  Children, age 63-3: 700 mg.  Children, age 32-8: 1,000 mg.  Children, age 37-13: 1,300 mg.  Teens, age 31-18: 1,300 mg.  Adults, age 85-50: 1,000 mg.  Adults, age 32-70:  Men: 1,000 mg.  Women: 1,200 mg.  Adults, age 9 or older: 1,200 mg.  Pregnant and breastfeeding females:  Teens: 1,300 mg.  Adults: 1,000 mg. Vitamin D Recommendations Vitamin D is the most essential vitamin for bone health. It helps the body to absorb calcium. Sunlight stimulates the skin to make vitamin D, so be sure to get enough sunlight. If you live in a cold climate or you do not get outside often, your health care provider may recommend that you take vitamin D supplements. Good sources of vitamin D in your diet include:  Egg yolks.  Saltwater fish.  Milk and cereal fortified with vitamin D. Follow these recommended amounts for daily vitamin D intake:  Children and teens, age 63-18: 600 international units.  Adults, age 632 or younger: 400-800 international units.  Adults, age 320 or older: 800-1,000 international units. Other Nutrients Other nutrients for bone health include:  Phosphorus. This mineral is found in meat, poultry, dairy foods, nuts, and legumes. The recommended daily intake for adult men and adult women is 700 mg.  Magnesium. This mineral is found in seeds,  nuts, dark green vegetables, and legumes. The recommended daily intake for adult men is 400-420 mg. For adult women, it is 310-320 mg.  Vitamin K. This vitamin is found in green leafy vegetables. The recommended daily intake is 120 mg for adult men and 90 mg for adult women. WHAT TYPE OF PHYSICAL ACTIVITY IS BEST FOR BUILDING AND MAINTAINING HEALTHY BONES? Weight-bearing and strength-building activities are important for building and maintaining peak bone mass. Weight-bearing activities cause muscles and bones to work against gravity.  Strength-building activities increases muscle strength that supports bones. Weight-bearing and muscle-building activities include:  Walking and hiking.  Jogging and running.  Dancing.  Gym exercises.  Lifting weights.  Tennis and racquetball.  Climbing stairs.  Aerobics. Adults should get at least 30 minutes of moderate physical activity on most days. Children should get at least 60 minutes of moderate physical activity on most days. Ask your health care provide what type of exercise is best for you. WHERE CAN I FIND MORE INFORMATION? For more information, check out the following websites:  [place]: YardHomes.se  [employer] of Health: http://www.niams.AnonymousEar.fr.asp   This information is not intended to replace advice given to you by your health care provider. Make sure you discuss any questions you have with your health care provider.   Document Released: 09/16/2003 Document Revised: 11/10/2014 Document Reviewed: 07/01/2014 Elsevier Interactive Patient Education [employer].

## 2016-04-05 NOTE — Telephone Encounter (Signed)
Please let pt know that her vitamin D level last year looked great and I know she has continued on the once weekly high dose since then.  I highly recommend that she change to over-the-counter vitamin D - she can probably just get it combined with her calcium carbonate supp which she is hopefully taking once to twice a day depending on how much calcium she is getting in her diet. Taking 800-2000u/d should be sufficient. Then when she comes in for her wellness visit we can recheck her level to make sure the otc dose is adequate.

## 2016-04-05 NOTE — Telephone Encounter (Signed)
Left message for patient to call us back.  

## 2016-04-06 LAB — CULTURE, GROUP A STREP: Organism ID, Bacteria: NORMAL

## 2016-04-06 NOTE — Telephone Encounter (Signed)
Left message for patient to call us back regarding her Vitamin D

## 2016-04-12 ENCOUNTER — Other Ambulatory Visit: Payer: Self-pay | Admitting: Family Medicine

## 2016-04-12 ENCOUNTER — Telehealth: Payer: Self-pay

## 2016-04-12 NOTE — Telephone Encounter (Signed)
I see strep from 04/04/2016 (rapid and lab) both negative. Please confirm and advise.

## 2016-04-12 NOTE — Telephone Encounter (Signed)
Throat bacterial culture was negative. thanks

## 2016-04-12 NOTE — Telephone Encounter (Signed)
PATIENT STATES SHE SAW DR. SHAW LAST Tuesday AND SHE HAS NOT HEARD THE RESULTS OF HER STREP CULTURE THAT WAS SENT TO ANOTHER LAB. BEST PHONE [phone] (CELL)  [place]

## 2016-04-14 ENCOUNTER — Other Ambulatory Visit: Payer: Self-pay

## 2016-04-14 DIAGNOSIS — E559 Vitamin D deficiency, unspecified: Secondary | ICD-10-CM

## 2016-04-14 NOTE — Telephone Encounter (Signed)
Pharm called and asked for a RF of vit D. Pt was just in for check up, but I don't see a vit D lab done since 02/2015. I OKd a 1 mos RF, but pt had told pharm that she thought that she would have gotten RFs of this for a year. Dr Brigitte Pulse, do you want to OK a year's RFs or have pt come in for Lab only visit?

## 2016-04-14 NOTE — Telephone Encounter (Signed)
Pt is needing to talk with someone about refilling her vitamin d rx

## 2016-04-17 NOTE — Telephone Encounter (Signed)
I want pt to switch to over-the-counter vitamin D 1000-2000u/day. We can recheck her level in 6 mos or so after she is on this to ensure she is maintaining.

## 2016-04-17 NOTE — Telephone Encounter (Signed)
Spoke to pt, and updated H# since it had been changed. Pt had not gotten our messages. Gave her inst's on Vit D, went over strep results and had appt cancelled for extra visit in Oct she doesn't need since coming in Dec for CPE.

## 2016-04-18 NOTE — Telephone Encounter (Signed)
Spoke to pt yesterday and gave her this info.

## 2016-04-21 NOTE — Telephone Encounter (Signed)
Patient was notified of results.  

## 2016-05-04 ENCOUNTER — Telehealth: Payer: Self-pay

## 2016-05-04 ENCOUNTER — Ambulatory Visit: Payer: Medicare Other | Admitting: Family Medicine

## 2016-05-04 NOTE — Telephone Encounter (Signed)
PATIENT STATES DR. SHAW WANTS HER TO TAKE OVER-THE-COUNTER VITAMIN D. SHE JUST NEEDS TO KNOW HOW MANY UNITS SHE SHOULD GET? BEST PHONE [phone] (CELL) [place]

## 2016-05-04 NOTE — Telephone Encounter (Signed)
Attempted to call pt about her vitamin d. No answer, left message to return call

## 2016-05-22 IMAGING — CR DG HAND 2V*L*
1 series · 1 of 1 positions shown · non-contrast
Comparison: None.

CLINICAL DATA: Nodule on the long finger of the left hand.

EXAM:
LEFT HAND - 2 VIEW

[PA]
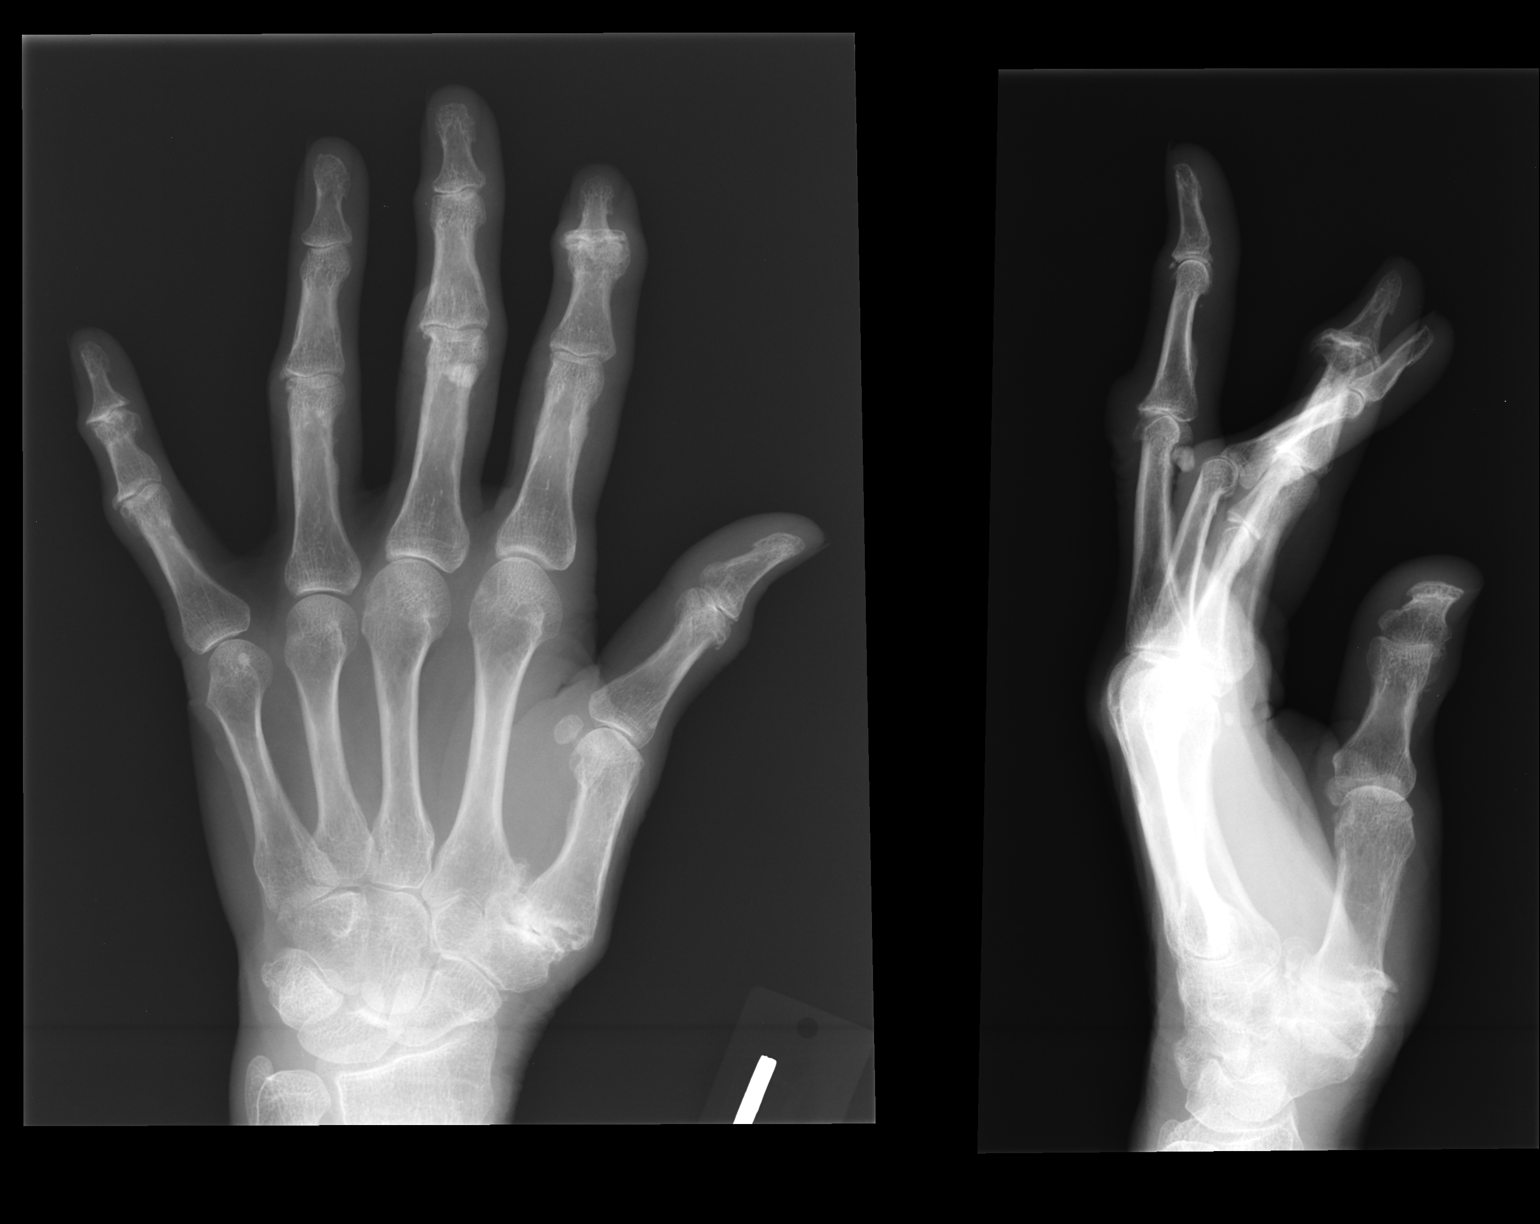

[1 of 1 positions shown; findings below may reference images not displayed]

FINDINGS: Extensive degenerative changes are present throughout the hand. Most
notably within the DIP joints of the second and third digits in the
PIP joints of the third digit, ring FLAIR, and fifth digit.

No acute osseous abnormalities present. Advanced degenerative
changes are present at the first CMC joint.
IMPRESSION: 1. Degenerative changes throughout the hand.
2. No acute abnormality.

## 2016-06-19 ENCOUNTER — Other Ambulatory Visit: Payer: Medicare Other

## 2016-06-20 LAB — CBC WITH DIFFERENTIAL/PLATELET
BASOS: 0 %
Basophils Absolute: 0 10*3/uL (ref 0.0–0.2)
EOS (ABSOLUTE): 0.1 10*3/uL (ref 0.0–0.4)
Eos: 1 %
Hematocrit: 45.6 % (ref 34.0–46.6)
Hemoglobin: 15.9 g/dL (ref 11.1–15.9)
IMMATURE GRANS (ABS): 0 10*3/uL (ref 0.0–0.1)
Immature Granulocytes: 0 %
LYMPHS: 15 %
Lymphocytes Absolute: 1.6 10*3/uL (ref 0.7–3.1)
MCH: 30.8 pg (ref 26.6–33.0)
MCHC: 34.9 g/dL (ref 31.5–35.7)
MCV: 88 fL (ref 79–97)
MONOS ABS: 0.7 10*3/uL (ref 0.1–0.9)
Monocytes: 7 %
NEUTROS ABS: 7.9 10*3/uL — AB (ref 1.4–7.0)
Neutrophils: 77 %
PLATELETS: 296 10*3/uL (ref 150–379)
RBC: 5.16 x10E6/uL (ref 3.77–5.28)
RDW: 13.2 % (ref 12.3–15.4)
WBC: 10.3 10*3/uL (ref 3.4–10.8)

## 2016-06-20 LAB — COMPREHENSIVE METABOLIC PANEL
A/G RATIO: 1.7 (ref 1.2–2.2)
ALT: 18 IU/L (ref 0–32)
AST: 21 IU/L (ref 0–40)
Albumin: 5.1 g/dL — ABNORMAL HIGH (ref 3.6–4.8)
Alkaline Phosphatase: 101 IU/L (ref 39–117)
BILIRUBIN TOTAL: 0.5 mg/dL (ref 0.0–1.2)
BUN / CREAT RATIO: 22 (ref 12–28)
BUN: 17 mg/dL (ref 8–27)
CHLORIDE: 97 mmol/L (ref 96–106)
CO2: 27 mmol/L (ref 18–29)
Calcium: 10.5 mg/dL — ABNORMAL HIGH (ref 8.7–10.3)
Creatinine, Ser: 0.78 mg/dL (ref 0.57–1.00)
GFR calc non Af Amer: 78 mL/min/{1.73_m2} (ref >59)
GFR, EST AFRICAN AMERICAN: 90 mL/min/{1.73_m2} (ref >59)
GLOBULIN, TOTAL: 3 g/dL (ref 1.5–4.5)
Glucose: 105 mg/dL — ABNORMAL HIGH (ref 65–99)
POTASSIUM: 4.1 mmol/L (ref 3.5–5.2)
SODIUM: 141 mmol/L (ref 134–144)
TOTAL PROTEIN: 8.1 g/dL (ref 6.0–8.5)

## 2016-06-20 LAB — HEMOGLOBIN A1C
ESTIMATED AVERAGE GLUCOSE: 120 mg/dL
HEMOGLOBIN A1C: 5.8 % — AB (ref 4.8–5.6)

## 2016-06-20 LAB — LIPID PANEL
Chol/HDL Ratio: 2.8 ratio units (ref 0.0–4.4)
Cholesterol, Total: 215 mg/dL — ABNORMAL HIGH (ref 100–199)
HDL: 77 mg/dL (ref >39)
LDL Calculated: 123 mg/dL — ABNORMAL HIGH (ref 0–99)
Triglycerides: 73 mg/dL (ref 0–149)
VLDL Cholesterol Cal: 15 mg/dL (ref 5–40)

## 2016-06-22 ENCOUNTER — Ambulatory Visit (INDEPENDENT_AMBULATORY_CARE_PROVIDER_SITE_OTHER): Payer: Medicare Other | Admitting: Family Medicine

## 2016-06-22 ENCOUNTER — Encounter: Payer: Self-pay | Admitting: Family Medicine

## 2016-06-22 VITALS — BP 122/90 | HR 104 | Temp 97.9°F | Resp 16 | Ht 65.5 in | Wt 132.4 lb

## 2016-06-22 DIAGNOSIS — Z1383 Encounter for screening for respiratory disorder NEC: Secondary | ICD-10-CM

## 2016-06-22 DIAGNOSIS — Z1389 Encounter for screening for other disorder: Secondary | ICD-10-CM

## 2016-06-22 DIAGNOSIS — M810 Age-related osteoporosis without current pathological fracture: Secondary | ICD-10-CM

## 2016-06-22 DIAGNOSIS — Z0000 Encounter for general adult medical examination without abnormal findings: Secondary | ICD-10-CM | POA: Diagnosis not present

## 2016-06-22 DIAGNOSIS — Z1231 Encounter for screening mammogram for malignant neoplasm of breast: Secondary | ICD-10-CM | POA: Diagnosis not present

## 2016-06-22 DIAGNOSIS — Z136 Encounter for screening for cardiovascular disorders: Secondary | ICD-10-CM

## 2016-06-22 DIAGNOSIS — Z1212 Encounter for screening for malignant neoplasm of rectum: Secondary | ICD-10-CM

## 2016-06-22 DIAGNOSIS — Z1211 Encounter for screening for malignant neoplasm of colon: Secondary | ICD-10-CM | POA: Diagnosis not present

## 2016-06-22 DIAGNOSIS — Z23 Encounter for immunization: Secondary | ICD-10-CM

## 2016-06-22 DIAGNOSIS — E2839 Other primary ovarian failure: Secondary | ICD-10-CM

## 2016-06-22 DIAGNOSIS — F411 Generalized anxiety disorder: Secondary | ICD-10-CM

## 2016-06-22 DIAGNOSIS — I10 Essential (primary) hypertension: Secondary | ICD-10-CM

## 2016-06-22 LAB — POCT URINALYSIS DIP (MANUAL ENTRY)
Bilirubin, UA: NEGATIVE
GLUCOSE UA: NEGATIVE
Ketones, POC UA: NEGATIVE
Leukocytes, UA: NEGATIVE
NITRITE UA: NEGATIVE
PH UA: 5
PROTEIN UA: NEGATIVE
RBC UA: NEGATIVE
Spec Grav, UA: 1.015
UROBILINOGEN UA: 0.2

## 2016-06-22 MED ORDER — METOPROLOL TARTRATE 25 MG PO TABS: 25.0000 mg | ORAL_TABLET | Freq: Two times a day (BID) | ORAL | 3 refills | Status: DC

## 2016-06-22 MED ORDER — ALPRAZOLAM 0.25 MG PO TABS: 0.2500 mg | ORAL_TABLET | Freq: Two times a day (BID) | ORAL | 2 refills | Status: DC | PRN

## 2016-06-22 MED ORDER — HYDROCHLOROTHIAZIDE 25 MG PO TABS: 25.0000 mg | ORAL_TABLET | Freq: Every day | ORAL | 3 refills | Status: DC

## 2016-06-22 NOTE — Progress Notes (Signed)
Subjective:    Ruth Walker is a 70 y.o. female who presents for Medicare Annual/Subsequent preventive examination.  Preventive Screening-Counseling & Management  Tobacco History  Smoking Status  . Never Smoker  Smokeless Tobacco  . Never Used     Problems Prior to Visit 1. Osteoporosis: Patient was initially started on bisphosphonate treatment in 2004 - was on Fosamax then Actonel. She had a vertebral compression fracture when she fell in the shower but as it was traumatic did not truly qualify as a fragility fracture.  T score in 2013 was -2.5. Patient went on a drug holiday.  In 2015 Dr. Toney Rakes recommended patient start Prolia 60mg  q6 mos, though could switch to Reclast or Forteo if pt prefers, rather than a bisphosphonate as she had been treated with one for so long. However, after discussion with her prior PCP Dr. Joseph Art she decided not to continue any future DEXA scans as she was not willing to undergo any treatments. Her mother had osteoporosis at 79 yo and is doing great.   mother's OP was mild.  Her father fell broke his hip and diagnosed with OP in his 65s.   She was on every other week high-dose that vitamin D  #2 vaginal atrophy. She's been on Estrace vaginal cream twice weekly prior to 2015 from Dr. Toney Rakes. No history of abnormal Paps.  Dr. Toney Rakes is retiring so she plans to switch her care to his partner Dr. Phineas Real.  #3 normal colonoscopy in 2010  Her daughter is Ruth Walker who is also a pt here. Patient and her husband recently had to move out of their long-term home and into an apartment as her husband's health status was declining and he cannot walk upstairs or maintain a yard. Patient was very devastated about this but it turns out that she loves the apartment - states "it worked out [employer]."  She has not been eating as well - lots of takeout with the move.  She has never used a smartphone and has always been an outspoken advocate against the  withdrawal from actual social interactions they caused but her daughter gave her one as a present and she has now become obsessed and is using it constantly.  Thinks it might be triggering her OCD as her use really feels out of her control - like an addiction.  She does refrain from bringing it into the bedroom at night but would like advice on how to decrease her use.  She has developed a left eye twitch and her eyes have been very dry which she thinks is strain from  staring at her phone screen constantly. Did improve some when she increased it spot size.  Her eye appt is next mo anyway.  She was using restasis bid and had cut down to qd but will increase back up to bid.   Has mammogram at [facility] in Jan/Feb   Current Problems (verified) Patient Active Problem List   Diagnosis Date Noted  . External hemorrhoids 03/04/2012  . DVT (deep venous thrombosis) ([place])   . Uterine polyp   . Osteoporosis   . Vertebral compression fracture ([place])   . IBS (irritable bowel syndrome) 09/05/2011  . Constipation, slow transit 09/05/2011  . Anxiety disorder 09/05/2011  . Diverticulosis 11/04/2010  . Gallstones 11/04/2010  . ANAL FISSURE 05/26/2008  . OBSESSIVE-COMPULSIVE PERSONALITY DISORDER 02/28/2008  . Essential hypertension 02/28/2008  . HEMORRHOIDS 02/28/2008  . ASTHMA 02/28/2008  . CYSTITIS, CHRONIC 02/28/2008    Medications Prior to  Visit Current Outpatient Prescriptions on File Prior to Visit  Medication Sig Dispense Refill  . ALPRAZolam (XANAX) 0.25 MG tablet Take 1 tablet (0.25 mg total) by mouth 2 (two) times daily as needed for anxiety. 60 tablet 2  . aspirin 81 MG tablet Take 81 mg by mouth daily.      . benzonatate (TESSALON) 100 MG capsule Take 1 capsule (100 mg total) by mouth 3 (three) times daily as needed for cough. 20 capsule 0  . Calcium Carbonate (CALCIUM 600 PO) Take 1 tablet by mouth 2 (two) times daily.    . cycloSPORINE (RESTASIS) 0.05 % ophthalmic emulsion 1 drop 2 (two)  times daily.      Marland Kitchen estradiol (ESTRACE) 0.1 MG/GM vaginal cream Place [id number] Applicatorfuls vaginally 2 (two) times a week. 42.5 g 8  . Guaifenesin (MUCINEX MAXIMUM STRENGTH) 1200 MG TB12 Take 1 tablet (1,200 mg total) by mouth every 12 (twelve) hours as needed. 14 tablet 1  . hydrochlorothiazide (HYDRODIURIL) 25 MG tablet Take 1 tablet (25 mg total) by mouth daily. 90 tablet 0  . loratadine (CLARITIN) 10 MG tablet Take 1 tablet (10 mg total) by mouth daily. 30 tablet 11  . metoprolol tartrate (LOPRESSOR) 25 MG tablet Take 1 tablet (25 mg total) by mouth 2 (two) times daily. 180 tablet 1  . polyethylene glycol (MIRALAX / GLYCOLAX) packet Take 17 g by mouth daily. Reported on 07/10/2015    . PRESCRIPTION MEDICATION Allergy injection once a week.[facility] [employer]    . Probiotic Product (ALIGN) 4 MG CAPS Take 1 capsule by mouth daily. 21 capsule 0  . sodium chloride (OCEAN) 0.65 % SOLN nasal spray Place 1 spray into both nostrils every 2 (two) hours. 1 Bottle 11  . triamcinolone cream (KENALOG) 0.1 % Apply 1 application topically 2 (two) times daily. 30 g 0  . Wheat Dextrin (BENEFIBER DRINK MIX PO) Take 10 mLs by mouth every morning. Take 2 teaspoons every morning      . [DISCONTINUED] Linaclotide (LINZESS) 145 MCG CAPS Take 1 capsule by mouth daily. 8 capsule 0   No current facility-administered medications on file prior to visit.     Current Medications (verified) Current Outpatient Prescriptions  Medication Sig Dispense Refill  . ALPRAZolam (XANAX) 0.25 MG tablet Take 1 tablet (0.25 mg total) by mouth 2 (two) times daily as needed for anxiety. 60 tablet 2  . aspirin 81 MG tablet Take 81 mg by mouth daily.      . benzonatate (TESSALON) 100 MG capsule Take 1 capsule (100 mg total) by mouth 3 (three) times daily as needed for cough. 20 capsule 0  . Calcium Carbonate (CALCIUM 600 PO) Take 1 tablet by mouth 2 (two) times daily.    . cycloSPORINE (RESTASIS) 0.05 % ophthalmic emulsion 1  drop 2 (two) times daily.      Marland Kitchen estradiol (ESTRACE) 0.1 MG/GM vaginal cream Place [id number] Applicatorfuls vaginally 2 (two) times a week. 42.5 g 8  . Guaifenesin (MUCINEX MAXIMUM STRENGTH) 1200 MG TB12 Take 1 tablet (1,200 mg total) by mouth every 12 (twelve) hours as needed. 14 tablet 1  . hydrochlorothiazide (HYDRODIURIL) 25 MG tablet Take 1 tablet (25 mg total) by mouth daily. 90 tablet 0  . loratadine (CLARITIN) 10 MG tablet Take 1 tablet (10 mg total) by mouth daily. 30 tablet 11  . metoprolol tartrate (LOPRESSOR) 25 MG tablet Take 1 tablet (25 mg total) by mouth 2 (two) times daily. 180 tablet 1  . polyethylene glycol (  MIRALAX / GLYCOLAX) packet Take 17 g by mouth daily. Reported on 07/10/2015    . PRESCRIPTION MEDICATION Allergy injection once a week.[facility] [employer]    . Probiotic Product (ALIGN) 4 MG CAPS Take 1 capsule by mouth daily. 21 capsule 0  . sodium chloride (OCEAN) 0.65 % SOLN nasal spray Place 1 spray into both nostrils every 2 (two) hours. 1 Bottle 11  . triamcinolone cream (KENALOG) 0.1 % Apply 1 application topically 2 (two) times daily. 30 g 0  . Wheat Dextrin (BENEFIBER DRINK MIX PO) Take 10 mLs by mouth every morning. Take 2 teaspoons every morning       No current facility-administered medications for this visit.      Allergies (verified) Aloe; Benadryl [diphenhydramine hcl]; Cephalexin; Ciprofloxacin; Mucinex [guaifenesin er]; Shellfish allergy; Sudafed [pseudoephedrine hcl]; and Sulfonamide derivatives   PAST HISTORY  Family History Family History  Problem Relation Age of Onset  . Diabetes Mother   . Hypertension Mother   . Thyroid disease Mother   . Colon cancer Neg Hx   . Hypertension Father   . Heart disease Father   . Thyroid disease Sister     Social History Social History  Substance Use Topics  . Smoking status: Never Smoker  . Smokeless tobacco: Never Used  . Alcohol use No     Are there smokers in your home (other than you)?  No  Risk Factors Current exercise habits: Gym/ health club routine includes [employer].  She broke her low cervical vertebral fracutre many years ago. Now it has progressed into her low back and sciatica. [facility] to [facility] and they have her doing exercises that she is using daily.  She is also getting a gym in her appt. . .  Dietary issues discussed:  Heart healthy  Cardiac risk factors: advanced age (older than 59 for men, 7 for women) and hypertension.   Depression Screen Depression screen [facility] 2/9 04/04/2016 03/09/2016 03/07/2016 03/04/2016 03/03/2016  Decreased Interest 0 0 0 0 0  Down, Depressed, Hopeless 0 0 0 0 0  PHQ - 2 Score 0 0 0 0 0  Altered sleeping - - - - -  Tired, decreased energy - - - - -  Change in appetite - - - - -  Feeling bad or failure about yourself  - - - - -  Trouble concentrating - - - - -  Moving slowly or fidgety/restless - - - - -  Suicidal thoughts - - - - -  PHQ-9 Score - - - - -  Difficult doing work/chores - - - - -   (Note: if answer to either of the following is "Yes", a more complete depression screening is indicated)   Over the past two weeks, have you felt down, depressed or hopeless? No  Over the past two weeks, have you felt little interest or pleasure in doing things? No  Have you lost interest or pleasure in daily life? No  Do you often feel hopeless? No  Do you cry easily over simple problems? No Pt loves the ocean and likes decorating with seashells  Activities of Daily Living In your present state of health, do you have any difficulty performing the following activities?:  Driving? No Managing money?  No Feeding yourself? No Getting from bed to chair? No Climbing a flight of stairs? No Preparing food and eating?: No Bathing or showering? No Getting dressed: No Getting to the toilet? No Using the toilet:No Moving around from place  to place: No In the past year have you fallen or had a near fall?:Yes   Are you sexually active?   Yes  Do you have more than one partner?  No  Hearing Difficulties: No Do you often ask people to speak up or repeat themselves? No Do you experience ringing or noises in your ears? No Do you have difficulty understanding soft or whispered voices? No   Do you feel that you have a problem with memory? No  Do you often misplace items? No  Do you feel safe at home?  Yes  Cognitive Testing  Alert? Yes  Normal Appearance?Yes  Oriented to person? Yes  Place? Yes   Time? Yes  Displays appropriate judgment?Yes  Can read the correct time from a watch face?Yes   Advanced Directives have been discussed with the patient? Yes  List the Names of Other Physician/Practitioners you currently use: 1.  Gyn- Dr. Phineas Real See Care Teams list  Indicate any recent Medical Services you may have received from other than [facility] providers in the past year (date may be approximate).  Immunization History  Administered Date(s) Administered  . Influenza Split 05/04/2012  . Influenza,inj,Quad PF,36+ Mos 03/29/2013, 04/19/2014  . Influenza-Unspecified 04/26/2015  . Tdap 06/18/2013    Screening Tests Health Maintenance  Topic Date Due  . ZOSTAVAX  12/21/2006  . PNA vac Low Risk Adult (1 of 2 - PCV13) 12/21/2011  . PAP SMEAR  12/12/2012  . MAMMOGRAM  07/31/2015  . INFLUENZA VACCINE  02/08/2016  . COLONOSCOPY  07/11/2019  . TETANUS/TDAP  06/19/2023  . DEXA SCAN  Completed  . Hepatitis C Screening  Completed    All answers were reviewed with the patient and necessary referrals were made:  Vere Diantonio, MD   06/22/2016   History reviewed: allergies, current medications, past family history, past medical history, past social history, past surgical history and problem list  Review of Systems A comprehensive review of systems was negative except for: Eyes: positive for spasm in left eye she suspects from strain Ears, nose, mouth, throat, and face: positive for postnasal drip Gastrointestinal: positive  for IBS Genitourinary: positive for dyspareunia treated wiht estrace Musculoskeletal: positive for sciatica Behavioral/Psych: positive for anxiety Allergic/Immunologic: positive for hay fever and shrimp allergy    Objective:  BP 122/90 (BP Location: Left Arm, Patient Position: Sitting, Cuff Size: Small)   Pulse (!) 104   Temp 97.9 F (36.6 C) (Oral)   Resp 16   Ht 5' 5.5" (1.664 m)   Wt 132 lb 6.4 oz (60.1 kg)   SpO2 100%   BMI 21.70 kg/m   Visual Acuity Screening   Right eye Left eye Both eyes  Without correction:     With correction: 20/40 20/25 20/25     BP 122/90 (BP Location: Left Arm, Patient Position: Sitting, Cuff Size: Small)   Pulse (!) 104   Temp 97.9 F (36.6 C) (Oral)   Resp 16   Ht 5' 5.5" (1.664 m)   Wt 132 lb 6.4 oz (60.1 kg)   SpO2 100%   BMI 21.70 kg/m   General Appearance:    Alert, cooperative, no distress, appears stated age  Head:    Normocephalic, without obvious abnormality, atraumatic  Eyes:    PERRL, conjunctiva/corneas clear, EOM's intact, fundi    benign, both eyes  Ears:    Normal TM's and external ear canals, both ears  Nose:   Nares normal, septum midline, mucosa normal, no drainage    or sinus  tenderness  Throat:   Lips, mucosa, and tongue normal; teeth and gums normal  Neck:   Supple, symmetrical, trachea midline, no adenopathy;    thyroid:  no enlargement/tenderness/nodules; no carotid   bruit or JVD  Back:     Symmetric, no curvature, ROM normal, no CVA tenderness  Lungs:     Clear to auscultation bilaterally, respirations unlabored  Chest Wall:    No tenderness or deformity   Heart:    Regular rate and rhythm, S1 and S2 normal, no murmur, rub   or gallop  Breast Exam:    No tenderness, masses, or nipple abnormality  Abdomen:     Soft, non-tender, bowel sounds active all four quadrants,    no masses, no organomegaly        Extremities:   Extremities normal, atraumatic, no cyanosis or edema  Pulses:   2+ and symmetric all  extremities  Skin:   Skin color, texture, turgor normal, no rashes or lesions  Lymph nodes:   Cervical, supraclavicular, and axillary nodes normal  Neurologic:   CNII-XII intact, normal strength, sensation and reflexes    throughout        Assessment:     1. Medicare annual wellness visit, subsequent   2. Screening for cardiovascular, respiratory, and genitourinary diseases   3. Screening for colorectal cancer   4. Encounter for screening mammogram for breast cancer   5. Estrogen deficiency   6. Generalized anxiety disorder   7. Essential hypertension   8. Age-related osteoporosis without current pathological fracture   9. Need for prophylactic vaccination against Streptococcus pneumoniae (pneumococcus)         Plan:  Ms. Elser is a delightful 71 year old woman here today for an annual wellness visit. Miss Carlstedt is full of energy but easily becomes quite worried over her health status. For example, she came in for at least 4 office visits with her last URI. She also does not respond well when given orders or demands regarding her health care as it is highly important to her to be in charge of her own health care. She prefers that her health care providers provide her necessary information so that she can make her own decisions.  If she is told that she "must" do anything, she will likely want to do the opposite.   During the course of the visit the patient was educated and counseled about appropriate screening and preventive services including:    Needs Zostavax  Has not had any pneumonia vaccines. Needs Prevnar  Mammograms done at [facility] - last documented one 2015 so overdue - pt agrees to sched  Due for repeat colonoscopy in 10 years - 2020  Prior, pt decided not to undergo any further DEXA scans as she was not willing to try any further treatment due to potential side effects, complications, and cost. However, after detailed discussion today, pt agrees to have a DEXA scan  later this year - if it is stable, she will likely want to remain untreated. However, if it is sig worse she may reconsider trx.  Rec she speak with specialist about this due to her disease severity in combination with few other chronic illness and so really has a great predicted lifespan potentially leaving a long time for her osteoporosis to continue to worsen. Patient has seen Dr. Phineas Real prior with an excellent provider relationship and so plans to follow up with him for further discussion. Could also consider endocrine or rheumatology referral if patient wants a second (  or 3rd or 4th) opinion.  Diet review for nutrition referral? Yes ____  Not Indicated ___x_   Patient Instructions (the written plan) was given to the patient.  Medicare Attestation I have personally reviewed: The patient's medical and social history Their use of alcohol, tobacco or illicit drugs Their current medications and supplements The patient's functional ability including ADLs,fall risks, home safety risks, cognitive, and hearing and visual impairment Diet and physical activities Evidence for depression or mood disorders  The patient's weight, height, BMI, and visual acuity have been recorded in the chart.  I have made referrals, counseling, and provided education to the patient based on review of the above and I have provided the patient with a written personalized care plan for preventive services.     [name], MD   06/22/2016    Orders Placed This Encounter  Procedures  . Pneumococcal conjugate vaccine 13-valent IM  . POCT urinalysis dipstick    Meds ordered this encounter  Medications  . ALPRAZolam (XANAX) 0.25 MG tablet    Sig: Take 1 tablet (0.25 mg total) by mouth 2 (two) times daily as needed for anxiety.    Dispense:  60 tablet    Refill:  2  . hydrochlorothiazide (HYDRODIURIL) 25 MG tablet    Sig: Take 1 tablet (25 mg total) by mouth daily.    Dispense:  90 tablet    Refill:  3  .  metoprolol tartrate (LOPRESSOR) 25 MG tablet    Sig: Take 1 tablet (25 mg total) by mouth 2 (two) times daily.    Dispense:  180 tablet    Refill:  3     [name], M.D.  [facility] [address] [place], [place] [address] [phone] phone [phone] fax  06/30/16 2:49 PM

## 2016-06-22 NOTE — Patient Instructions (Addendum)
Ruth Walker , Thank you for taking time to come for your Medicare Wellness Visit. I appreciate your ongoing commitment to your health goals. Please review the following plan we discussed and let me know if I can assist you in the future.   These are the goals we discussed: Goals    None      This is a list of the screening recommended for you and due dates:  Health Maintenance  Topic Date Due  . Shingles Vaccine  12/21/2006  . Pneumonia vaccines (1 of 2 - PCV13) 12/21/2011  . Pap Smear  12/12/2012  . Mammogram  07/31/2015  . Colon Cancer Screening  07/11/2019  . Tetanus Vaccine  06/19/2023  . Flu Shot  Completed  . DEXA scan (bone density measurement)  Completed  .  Hepatitis C: One time screening is recommended by Center for Disease Control  (CDC) for  adults born from [birth year] through 1964.   Completed    IF you received an x-ray today, you will receive an invoice from [facility]. Please contact [facility] at [phone] with questions or concerns regarding your invoice.   IF you received labwork today, you will receive an invoice from [facility]. Please contact [facility] at [phone] with questions or concerns regarding your invoice.   Our billing staff will not be able to assist you with questions regarding bills from these companies.  You will be contacted with the lab results as soon as they are available. The fastest way to get your results is to activate your My Chart account. Instructions are located on the last page of this paperwork. If you have not heard from [place] regarding the results in 2 weeks, please contact this office.     Osteoporosis Osteoporosis is the thinning and loss of density in the bones. Osteoporosis makes the bones more brittle, fragile, and likely to break (fracture). Over time, osteoporosis can cause the bones to become so weak that they fracture after a simple fall. The bones most likely to fracture are the bones in the hip, wrist,  and spine. What are the causes? The exact cause is not known. What increases the risk? Anyone can develop osteoporosis. You may be at greater risk if you have a family history of the condition or have poor nutrition. You may also have a higher risk if you are:  Female.  69 years old or older.  A smoker.  Not physically active.  White or Asian.  Slender. What are the signs or symptoms? A fracture might be the first sign of the disease, especially if it results from a fall or injury that would not usually cause a bone to break. Other signs and symptoms include:  Low back and neck pain.  Stooped posture.  Height loss. How is this diagnosed? To make a diagnosis, your health care provider may:  Take a medical history.  Perform a physical exam.  Order tests, such as:  A bone mineral density test.  A dual-energy X-ray absorptiometry test. How is this treated? The goal of osteoporosis treatment is to strengthen your bones to reduce your risk of a fracture. Treatment may involve:  Making lifestyle changes, such as:  Eating a diet rich in calcium.  Doing weight-bearing and muscle-strengthening exercises.  Stopping tobacco use.  Limiting alcohol intake.  Taking medicine to slow the process of bone loss or to increase bone density.  Monitoring your levels of calcium and vitamin D. Follow these instructions at home:  Include calcium  and vitamin D in your diet. Calcium is important for bone health, and vitamin D helps the body absorb calcium.  Perform weight-bearing and muscle-strengthening exercises as directed by your health care provider.  Do not use any tobacco products, including cigarettes, chewing tobacco, and electronic cigarettes. If you need help quitting, ask your health care provider.  Limit your alcohol intake.  Take medicines only as directed by your health care provider.  Keep all follow-up visits as directed by your health care provider. This is  important.  Take precautions at home to lower your risk of falling, such as:  Keeping rooms well lit and clutter free.  Installing safety rails on stairs.  Using rubber mats in the bathroom and other areas that are often wet or slippery. Get help right away if: You fall or injure yourself. This information is not intended to replace advice given to you by your health care provider. Make sure you discuss any questions you have with your health care provider. Document Released: 04/05/2005 Document Revised: 11/29/2015 Document Reviewed: 12/04/2013 Elsevier Interactive Patient Education  2017 [employer].

## 2016-06-26 ENCOUNTER — Ambulatory Visit (INDEPENDENT_AMBULATORY_CARE_PROVIDER_SITE_OTHER): Payer: Medicare Other | Admitting: Family Medicine

## 2016-06-26 ENCOUNTER — Encounter: Payer: Self-pay | Admitting: Family Medicine

## 2016-06-26 VITALS — BP 122/80 | HR 75 | Temp 97.6°F | Resp 16 | Ht 65.0 in | Wt 131.0 lb

## 2016-06-26 DIAGNOSIS — R0981 Nasal congestion: Secondary | ICD-10-CM

## 2016-06-26 DIAGNOSIS — J029 Acute pharyngitis, unspecified: Secondary | ICD-10-CM | POA: Diagnosis not present

## 2016-06-26 LAB — POCT RAPID STREP A (OFFICE): RAPID STREP A SCREEN: NEGATIVE

## 2016-06-26 MED ORDER — BENZONATATE 100 MG PO CAPS: 100.0000 mg | ORAL_CAPSULE | Freq: Three times a day (TID) | ORAL | 0 refills | Status: DC | PRN

## 2016-06-26 NOTE — Patient Instructions (Addendum)
Nasal Congestion-Atrovent 2 sprays, twice daily as needed for congestion.  You are ok to obtain your allergy shot this week.  Call and follow-up on Friday if you are not improving and we will consider an antibiotic at that time.   IF you received an x-ray today, you will receive an invoice from [facility]. Please contact [facility] at [phone] with questions or concerns regarding your invoice.   IF you received labwork today, you will receive an invoice from [facility]. Please contact [facility] at [phone] with questions or concerns regarding your invoice.   Our billing staff will not be able to assist you with questions regarding bills from these companies.  You will be contacted with the lab results as soon as they are available. The fastest way to get your results is to activate your My Chart account. Instructions are located on the last page of this paperwork. If you have not heard from [place] regarding the results in 2 weeks, please contact this office.      Upper Respiratory Infection, Adult Most upper respiratory infections (URIs) are a viral infection of the air passages leading to the lungs. A URI affects the nose, throat, and upper air passages. The most common type of URI is nasopharyngitis and is typically referred to as "the common cold." URIs run their course and usually go away on their own. Most of the time, a URI does not require medical attention, but sometimes a bacterial infection in the upper airways can follow a viral infection. This is called a secondary infection. Sinus and middle ear infections are common types of secondary upper respiratory infections. Bacterial pneumonia can also complicate a URI. A URI can worsen asthma and chronic obstructive pulmonary disease (COPD). Sometimes, these complications can require emergency medical care and may be life threatening. What are the causes? Almost all URIs are caused by viruses. A virus is a type of  germ and can spread from one person to another. What increases the risk? You may be at risk for a URI if:  You smoke.  You have chronic heart or lung disease.  You have a weakened defense (immune) system.  You are very young or very old.  You have nasal allergies or asthma.  You work in crowded or poorly ventilated areas.  You work in health care facilities or schools. What are the signs or symptoms? Symptoms typically develop 2-3 days after you come in contact with a cold virus. Most viral URIs last 7-10 days. However, viral URIs from the influenza virus (flu virus) can last 14-18 days and are typically more severe. Symptoms may include:  Runny or stuffy (congested) nose.  Sneezing.  Cough.  Sore throat.  Headache.  Fatigue.  Fever.  Loss of appetite.  Pain in your forehead, behind your eyes, and over your cheekbones (sinus pain).  Muscle aches. How is this diagnosed? Your health care provider may diagnose a URI by:  Physical exam.  Tests to check that your symptoms are not due to another condition such as:  Strep throat.  Sinusitis.  Pneumonia.  Asthma. How is this treated? A URI goes away on its own with time. It cannot be cured with medicines, but medicines may be prescribed or recommended to relieve symptoms. Medicines may help:  Reduce your fever.  Reduce your cough.  Relieve nasal congestion. Follow these instructions at home:  Take medicines only as directed by your health care provider.  Gargle warm saltwater or take cough drops to comfort your  throat as directed by your health care provider.  Use a warm mist humidifier or inhale steam from a shower to increase air moisture. This may make it easier to breathe.  Drink enough fluid to keep your urine clear or pale yellow.  Eat soups and other clear broths and maintain good nutrition.  Rest as needed.  Return to work when your temperature has returned to normal or as your health care  provider advises. You may need to stay home longer to avoid infecting others. You can also use a face mask and careful hand washing to prevent spread of the virus.  Increase the usage of your inhaler if you have asthma.  Do not use any tobacco products, including cigarettes, chewing tobacco, or electronic cigarettes. If you need help quitting, ask your health care provider. How is this prevented? The best way to protect yourself from getting a cold is to practice good hygiene.  Avoid oral or hand contact with people with cold symptoms.  Wash your hands often if contact occurs. There is no clear evidence that vitamin C, vitamin E, echinacea, or exercise reduces the chance of developing a cold. However, it is always recommended to get plenty of rest, exercise, and practice good nutrition. Contact a health care provider if:  You are getting worse rather than better.  Your symptoms are not controlled by medicine.  You have chills.  You have worsening shortness of breath.  You have brown or red mucus.  You have yellow or brown nasal discharge.  You have pain in your face, especially when you bend forward.  You have a fever.  You have swollen neck glands.  You have pain while swallowing.  You have white areas in the back of your throat. Get help right away if:  You have severe or persistent:  Headache.  Ear pain.  Sinus pain.  Chest pain.  You have chronic lung disease and any of the following:  Wheezing.  Prolonged cough.  Coughing up blood.  A change in your usual mucus.  You have a stiff neck.  You have changes in your:  Vision.  Hearing.  Thinking.  Mood. This information is not intended to replace advice given to you by your health care provider. Make sure you discuss any questions you have with your health care provider. Document Released: 12/20/2000 Document Revised: 02/27/2016 Document Reviewed: 10/01/2013 Elsevier Interactive Patient Education   2017 [employer].

## 2016-06-26 NOTE — Progress Notes (Signed)
Patient ID: TEISHA KAZMIERSKI, female    DOB: [birth year], 70 y.o.   MRN: [id number]  PCP: [name], MD  Chief Complaint  Patient presents with  . Sore Throat    x 3-4 days   . Nasal Congestion    Subjective:   HPI 70 year female presents for evaluation sore and nasal congestion 3 days. Pt was recently seen in the office on 06/22/16 for a medicare annual wellness visit. She reports on the same day she had some nasal congestion with a scratchy throat.  Subsequently, she has developed a cough which is occasionally productive yellow tinged phlegm. Denies fever or headache. Reports missing her weekly allergy shot and feels that this may be contributing to her symptoms.  Social History   Social History  . Marital status: Married    Spouse name: N/A  . Number of children: 2  . Years of education: N/A   Occupational History  . Retired    Social History Main Topics  . Smoking status: Never Smoker  . Smokeless tobacco: Never Used  . Alcohol use No  . Drug use: No  . Sexual activity: Yes    Birth control/ protection: Post-menopausal     Comment: number of sex partners in the last 54 months  1   Other Topics Concern  . Not on file   Social History Narrative   Patient gets regular exercise. Cares for her grandchildren.    Family History  Problem Relation Age of Onset  . Diabetes Mother   . Hypertension Mother   . Thyroid disease Mother   . Hypertension Father   . Heart disease Father   . Thyroid disease Sister   . Colon cancer Neg Hx    Review of Systems See HPI  Patient Active Problem List   Diagnosis Date Noted  . External hemorrhoids 03/04/2012  . DVT (deep venous thrombosis) ([place])   . Uterine polyp   . Osteoporosis   . Vertebral compression fracture ([place])   . IBS (irritable bowel syndrome) 09/05/2011  . Constipation, slow transit 09/05/2011  . Anxiety disorder 09/05/2011  . Diverticulosis 11/04/2010  . Gallstones 11/04/2010  . ANAL FISSURE  05/26/2008  . OBSESSIVE-COMPULSIVE PERSONALITY DISORDER 02/28/2008  . Essential hypertension 02/28/2008  . HEMORRHOIDS 02/28/2008  . ASTHMA 02/28/2008  . CYSTITIS, CHRONIC 02/28/2008     Prior to Admission medications   Medication Sig Start Date End Date Taking? Authorizing Provider  ALPRAZolam (XANAX) 0.25 MG tablet Take 1 tablet (0.25 mg total) by mouth 2 (two) times daily as needed for anxiety. 06/22/16  Yes Shawnee Knapp, MD  aspirin 81 MG tablet Take 81 mg by mouth daily.     Yes Historical Provider, MD  benzonatate (TESSALON) 100 MG capsule Take 1 capsule (100 mg total) by mouth 3 (three) times daily as needed for cough. 03/03/16  Yes Shawnee Knapp, MD  Calcium Carbonate (CALCIUM 600 PO) Take 1 tablet by mouth 2 (two) times daily.   Yes Historical Provider, MD  cycloSPORINE (RESTASIS) 0.05 % ophthalmic emulsion 1 drop 2 (two) times daily.     Yes Historical Provider, MD  estradiol (ESTRACE) 0.1 MG/GM vaginal cream Place [id number] Applicatorfuls vaginally 2 (two) times a week. 02/24/15  Yes Roselee Culver, MD  Guaifenesin [facility] MAXIMUM STRENGTH) 1200 MG TB12 Take 1 tablet (1,200 mg total) by mouth every 12 (twelve) hours as needed. 04/04/16  Yes Shawnee Knapp, MD  hydrochlorothiazide (HYDRODIURIL) 25 MG tablet Take 1 tablet (25  mg total) by mouth daily. 06/22/16  Yes Shawnee Knapp, MD  loratadine (CLARITIN) 10 MG tablet Take 1 tablet (10 mg total) by mouth daily. 03/03/16  Yes Shawnee Knapp, MD  metoprolol tartrate (LOPRESSOR) 25 MG tablet Take 1 tablet (25 mg total) by mouth 2 (two) times daily. 06/22/16  Yes Shawnee Knapp, MD  polyethylene glycol [facility] / GLYCOLAX) packet Take 17 g by mouth daily. Reported on 07/10/2015   Yes Historical Provider, MD  PRESCRIPTION MEDICATION Allergy injection once a week.[facility] [employer] [facility]   Yes Historical Provider, MD  Probiotic Product (ALIGN) 4 MG CAPS Take 1 capsule by mouth daily. 07/05/11  Yes Sable Feil, MD  sodium chloride (OCEAN) 0.65 % SOLN  nasal spray Place 1 spray into both nostrils every 2 (two) hours. 04/04/16  Yes Shawnee Knapp, MD  triamcinolone cream (KENALOG) 0.1 % Apply 1 application topically 2 (two) times daily. 06/25/15  Yes Sarah Alleen Borne, PA-C  Wheat Dextrin (BENEFIBER DRINK MIX PO) Take 10 mLs by mouth every morning. Take 2 teaspoons every morning     Yes Historical Provider, MD   Vitals:   06/26/16 1002  BP: 122/80  Pulse: 75  Resp: 16  Temp: 97.6 F (36.4 C)   Objective:  Physical Exam  Constitutional: She appears well-developed and well-nourished.  HENT:  Head: Normocephalic and atraumatic.  Right Ear: External ear normal.  Left Ear: External ear normal.  Mouth/Throat: Oropharynx is clear and moist.  Eyes: Conjunctivae and EOM are normal. Pupils are equal, round, and reactive to light.  Neck: Normal range of motion. Neck supple.  Cardiovascular: Normal rate, regular rhythm, normal heart sounds and intact distal pulses.   Pulmonary/Chest: Effort normal and breath sounds normal.  Musculoskeletal: Normal range of motion.  Neurological: She is alert.  Skin: Skin is warm and dry.  Psychiatric: She has a normal mood and affect. Her behavior is normal. Judgment and thought content normal.      Assessment & Plan:  1. Sore throat - POCT rapid strep A - Culture, Group A Strep 2. Nasal congestion  Explain to patient, symptoms are most likely viral. She was rather insistent regarding receiving an antibiotic. Advised to phone the office if symptoms worsen at the end of week. She is travelling and fears she will become ill. Reinforced, best treatment is symptomatic treatment only at this point.  -Benzonatate 100-200 mg up to 3 times daily for cough -Atrovent Nasal Spray for nasal congestion, 2 sprays, twice daily -Obtain your allergy shot today.   Carroll Sage. Kenton Kingfisher, MSN, FNP-C [facility]

## 2016-06-28 LAB — CULTURE, GROUP A STREP: STREP A CULTURE: NEGATIVE

## 2016-06-29 ENCOUNTER — Telehealth: Payer: Self-pay

## 2016-06-29 NOTE — Telephone Encounter (Signed)
Step negative, see result. Any further advice?

## 2016-06-29 NOTE — Telephone Encounter (Signed)
PATIENT SAW Ruth Walker ON Monday 06/26/16 AND SHE WOULD LIKE TO GET HER STREP CULTURE RESULTS THAT WAS SENT TO AN OUT-SIDE LAB. HER IN-HOUSE STREP SCREEN WAS NEGATIVE. BEST PHONE [phone] (HOME) [place]

## 2016-06-30 ENCOUNTER — Telehealth: Payer: Self-pay | Admitting: *Deleted

## 2016-06-30 ENCOUNTER — Encounter: Payer: Self-pay | Admitting: *Deleted

## 2016-06-30 NOTE — Telephone Encounter (Signed)
No additional advise

## 2016-07-04 NOTE — Telephone Encounter (Signed)
L/m with kims note

## 2016-07-04 NOTE — Telephone Encounter (Signed)
Error. Unable to leave message

## 2016-07-05 ENCOUNTER — Encounter: Payer: Self-pay | Admitting: *Deleted

## 2016-07-07 ENCOUNTER — Other Ambulatory Visit: Payer: Self-pay | Admitting: Urgent Care

## 2016-09-08 ENCOUNTER — Ambulatory Visit: Payer: Medicare Other

## 2016-09-09 ENCOUNTER — Ambulatory Visit (INDEPENDENT_AMBULATORY_CARE_PROVIDER_SITE_OTHER): Payer: Medicare Other | Admitting: Urgent Care

## 2016-09-09 ENCOUNTER — Ambulatory Visit: Payer: Medicare Other

## 2016-09-09 VITALS — BP 120/80 | HR 111 | Temp 97.7°F | Resp 18 | Ht 65.0 in | Wt 135.2 lb

## 2016-09-09 DIAGNOSIS — J4520 Mild intermittent asthma, uncomplicated: Secondary | ICD-10-CM | POA: Diagnosis not present

## 2016-09-09 DIAGNOSIS — J029 Acute pharyngitis, unspecified: Secondary | ICD-10-CM

## 2016-09-09 DIAGNOSIS — Z8589 Personal history of malignant neoplasm of other organs and systems: Secondary | ICD-10-CM

## 2016-09-09 DIAGNOSIS — J3089 Other allergic rhinitis: Secondary | ICD-10-CM

## 2016-09-09 DIAGNOSIS — R059 Cough, unspecified: Secondary | ICD-10-CM

## 2016-09-09 DIAGNOSIS — L989 Disorder of the skin and subcutaneous tissue, unspecified: Secondary | ICD-10-CM

## 2016-09-09 DIAGNOSIS — R05 Cough: Secondary | ICD-10-CM

## 2016-09-09 MED ORDER — BENZONATATE 100 MG PO CAPS: 100.0000 mg | ORAL_CAPSULE | Freq: Three times a day (TID) | ORAL | 0 refills | Status: DC | PRN

## 2016-09-09 MED ORDER — MONTELUKAST SODIUM 10 MG PO TABS: 10.0000 mg | ORAL_TABLET | Freq: Every day | ORAL | 3 refills | Status: DC

## 2016-09-09 NOTE — Patient Instructions (Addendum)
Montelukast oral tablets What is this medicine? MONTELUKAST (mon te LOO kast) is used to prevent and treat the symptoms of asthma. It is also used to treat allergies. Do not use for an acute asthma attack. This medicine may be used for other purposes; ask your health care provider or pharmacist if you have questions. COMMON BRAND NAME(S): Singulair What should I tell my health care provider before I take this medicine? They need to know if you have any of these conditions: -liver disease -an unusual or allergic reaction to montelukast, other medicines, foods, dyes, or preservatives -pregnant or trying to get pregnant -breast-feeding How should I use this medicine? This medicine should be given by mouth. Follow the directions on the prescription label. Take this medicine at the same time every day. You may take this medicine with or without meals. Do not chew the tablets. Do not stop taking your medicine unless your doctor tells you to. Talk to your pediatrician regarding the use of this medicine in children. Special care may be needed. While this drug may be prescribed for children as young as 15 years of age for selected conditions, precautions do apply. Overdosage: If you think you have taken too much of this medicine contact a poison control center or emergency room at once. NOTE: This medicine is only for you. Do not share this medicine with others. What if I miss a dose? If you miss a dose, take it as soon as you can. If it is almost time for your next dose, take only that dose. Do not take double or extra doses. What may interact with this medicine? -anti-infectives like rifampin and rifabutin -medicines for diabetes like rosiglitazone and repaglinide -medicines for seizures like phenytoin, phenobarbital, and carbamazepine -paclitaxel This list may not describe all possible interactions. Give your health care provider a list of all the medicines, herbs, non-prescription drugs, or  dietary supplements you use. Also tell them if you smoke, drink alcohol, or use illegal drugs. Some items may interact with your medicine. What should I watch for while using this medicine? Visit your doctor or health care professional for regular checks on your progress. Tell your doctor or health care professional if your allergy or asthma symptoms do not improve. Take your medicine even when you do not have symptoms. Do not stop taking any of your medicine(s) unless your doctor tells you to. If you have asthma, talk to your doctor about what to do in an acute asthma attack. Always have your inhaled rescue medicine for asthma attacks with you. Patients and their families should watch for new or worsening thoughts of suicide or depression. Also watch for sudden changes in feelings such as feeling anxious, agitated, panicky, irritable, hostile, aggressive, impulsive, severely restless, overly excited and hyperactive, or not being able to sleep. Any worsening of mood or thoughts of suicide or dying should be reported to your health care professional right away. What side effects may I notice from receiving this medicine? Side effects that you should report to your doctor or health care professional as soon as possible: -allergic reactions like skin rash or hives, or swelling of the face, lips, or tongue -breathing problems -confusion -dark urine -fever or infection -flu-like symptoms -hallucinations -painful lumps under the skin -pain, tingling, numbness in the hands or feet -sinus pain or swelling -suicidal thoughts or other mood changes -tremors -trouble sleeping -uncontrolled muscle movements -unusual bleeding or bruising -yellowing of the eyes or skin Side effects that usually do not require   medical attention (report to your doctor or health care professional if they continue or are bothersome): -cough -dizziness -drowsiness -headache -nightmares -stomach upset -stuffy nose This list may  not describe all possible side effects. Call your doctor for medical advice about side effects. You may report side effects to FDA at 1-800-FDA-1088. Where should I keep my medicine? Keep out of the reach of children. Store at room temperature between 15 and 30 degrees C (59 and 86 degrees F). Protect from light and moisture. Keep this medicine in the original bottle. Throw away any unused medicine after the expiration date. NOTE: This sheet is a summary. It may not cover all possible information. If you have questions about this medicine, talk to your doctor, pharmacist, or health care provider.  2018 Elsevier/Gold Standard (2015-06-28 09:40:44)     IF you received an x-ray today, you will receive an invoice from [facility]. Please contact [facility] at [phone] with questions or concerns regarding your invoice.   IF you received labwork today, you will receive an invoice from [place]. Please contact [facility] at [phone] with questions or concerns regarding your invoice.   Our billing staff will not be able to assist you with questions regarding bills from these companies.  You will be contacted with the lab results as soon as they are available. The fastest way to get your results is to activate your My Chart account. Instructions are located on the last page of this paperwork. If you have not heard from [place] regarding the results in 2 weeks, please contact this office.

## 2016-09-09 NOTE — Progress Notes (Signed)
    MRN: [id number] DOB: [birth year]  Subjective:   Ruth Walker is a 71 y.o. female presenting for chief complaint of Cough (over a week & has PND and a tickle in throat)  Reports 1+ week history of dry cough, worse at night. Has had nasal congestion, post-nasal drainage, sore throat. Patient lives at [place] at [place], has had worsening allergies since then. Has a history of asthma, allergies. Uses Claritin for allergies. Cannot use albuterol inhaler due to elevations in her blood pressure. She was not able to take her allergy shot this week. Denies fever, chest pain, shob, wheezing, sinus pain, ear pain, ear drainage, rashes.  Skin lesion - Patient would like to have a skin lesion examined. She has it on her torso and has a history of squamous cell carcinoma of her skin. She has a dermatologist but has not yet set up an appointment.   Ruth Walker has a current medication list which includes the following prescription(s): alprazolam, aspirin, calcium carbonate, cyclosporine, estradiol, guaifenesin, hydrochlorothiazide, loratadine, metoprolol tartrate, polyethylene glycol, PRESCRIPTION MEDICATION, align, sodium chloride, triamcinolone cream, wheat dextrin, and benzonatate. Also is allergic to aloe; benadryl [diphenhydramine hcl]; cephalexin; ciprofloxacin; mucinex [guaifenesin er]; shellfish allergy; sudafed [pseudoephedrine hcl]; and sulfonamide derivatives.  Ruth Walker  has a past medical history of Allergy; Anal fissure; Anxiety states; Asthma; Cholelithiasis; Depression; Diverticulosis; Environmental allergies; Hemorrhoids; Irritable bowel syndrome (IBS); Osteoporosis; Skin cancer; Uterine polyp; and Vertebral compression fracture ([place]). Also  has a past surgical history that includes Cesarean section; Hemorrhoid surgery; Back surgery; Skin cancer excision; Endometrial polypectomy; Hysteroscopy; Dilation and curettage of uterus; Mouth surgery; Fracture surgery; and IBS.  Objective:   Vitals: BP 120/80    Pulse (!) 111   Temp 97.7 F (36.5 C) (Oral)   Resp 18   Ht 5\' 5"  (1.651 m)   Wt 135 lb 4 oz (61.3 kg)   SpO2 100%   BMI 22.51 kg/m   Pulse was 92 on recheck by PA-Ariyon Gerstenberger.  Physical Exam Constitutional: She is oriented to person, place, and time. She appears well-developed and well-nourished.  HENT:  TM's intact bilaterally, no effusions or erythema. Nasal turbinates dry, nasal passages patent. No sinus tenderness. Oropharynx moderate post-nasal drainage, mucous membranes moist, dentition in good repair.  Eyes: Right eye exhibits no discharge. Left eye exhibits no discharge.  Neck: Normal range of motion. Neck supple.  Cardiovascular: Normal rate, regular rhythm and intact distal pulses.  Exam reveals no gallop and no friction rub.   No murmur heard. Pulmonary/Chest: No respiratory distress. She has no wheezes. She has no rales.  Lymphadenopathy:    She has no cervical adenopathy.  Neurological: She is alert and oriented to person, place, and time.  Skin: Skin is warm and dry.      Assessment and Plan :   1. Acute nonseasonal allergic rhinitis due to other allergen 2. Mild intermittent extrinsic asthma without complication 3. Cough 4. Sore throat - May be undergoing viral syndrome worsened by difficult to control allergies and reactive airway. Offered supportive care. Start Singulair. RTC in 1 week if no improvement.  5. Skin lesion 6. History of squamous cell carcinoma - Recommended biopsy of skin lesion. Patient will rtc for procedure visit. She will also contact her dermatologist to see if she can get in with them.   Jaynee Eagles, PA-C [facility] [id number] 09/09/2016  3:25 PM

## 2016-09-21 ENCOUNTER — Encounter: Payer: Self-pay | Admitting: Family Medicine

## 2016-09-21 ENCOUNTER — Ambulatory Visit (INDEPENDENT_AMBULATORY_CARE_PROVIDER_SITE_OTHER): Payer: Medicare Other | Admitting: Family Medicine

## 2016-09-21 VITALS — BP 140/100 | HR 72 | Temp 97.8°F | Ht 65.0 in | Wt 136.0 lb

## 2016-09-21 DIAGNOSIS — L820 Inflamed seborrheic keratosis: Secondary | ICD-10-CM | POA: Diagnosis not present

## 2016-09-21 DIAGNOSIS — I10 Essential (primary) hypertension: Secondary | ICD-10-CM

## 2016-09-21 DIAGNOSIS — R0989 Other specified symptoms and signs involving the circulatory and respiratory systems: Secondary | ICD-10-CM

## 2016-09-21 NOTE — Patient Instructions (Addendum)
We recommend that you schedule a mammogram for breast cancer screening. Typically, you do not need a referral to do this. Please contact a local imaging center to schedule your mammogram.  [facility] - [phone]  *ask for the Radiology Department [facility] - [phone] or [phone]  [facility] - [phone] [place] [phone] [facility] - [phone]  *ask for the [facility] - [phone]  *ask for the Radiology Department [facility] - [phone]  *ask for the [place] - [phone]  IF you received an x-ray today, you will receive an invoice from [facility]. Please contact [facility] at [phone] with questions or concerns regarding your invoice.   IF you received labwork today, you will receive an invoice from [facility]. Please contact [facility] at [phone] with questions or concerns regarding your invoice.   Our billing staff will not be able to assist you with questions regarding bills from these companies.  You will be contacted with the lab results as soon as they are available. The fastest way to get your results is to activate your My Chart account. Instructions are located on the last page of this paperwork. If you have not heard from [place] regarding the results in 2 weeks, please contact this office.      Cryosurgery for Skin Conditions, Care After This sheet gives you information about how to care for yourself after your procedure. Your health care provider may also give you more specific instructions. If you have problems or questions, contact your health care provider. What can I expect after the procedure? After your procedure, it is common to have redness, swelling, and a blister that forms over the treated area. The blister may contain a small amount of  blood. After about 2 weeks, the blister will break on its own, leaving a scab. Then the treated area will heal. After healing, there is usually little or no scarring. Follow these instructions at home: Caring for the treated area   Follow instructions from your health care provider about how to take care of the treated area. Make sure you:  Keep the area covered with a bandage (dressing) until it heals, or for as long as told by your health care provider.  Wash your hands with soap and water before you change your dressing. If soap and water are not available, use hand sanitizer.  Change your dressing as told by your health care provider.  Keep the dressing and the treated area clean and dry. If the dressing gets wet, change it right away.  Clean the treated area with soap and water.  Check the treated area every day for signs of infection. Check for:  More redness, swelling, or pain.  More fluid or blood.  Warmth.  Pus or a bad smell. General instructions   Do not pick at your blister or try to break it open. This can cause infection and scarring.  Do not apply any medicine, cream, or lotion to the treated area unless directed by your health care provider.  Take over-the-counter and prescription medicines only as told by your health care provider.  Keep all follow-up visits as told by your health care provider. This is important. Contact a health care provider if:  You have more redness, swelling, or pain around the treated area.  You have more fluid or blood coming from the treated area.  The treated area feels warm to the touch.  You have pus or a bad smell coming from the treated area.  Your blister becomes large and painful. Get help right away if:  You have a fever and have redness spreading from the treated area. Summary  The treated area will become red and swollen shortly after the procedure.  You should keep the treated area and your dressing clean and  dry.  Check the treated area every day for signs of infection, such as fluid, pus, warmth, or having more redness, swelling, or pain.  Do not pick at your blister or try to break it open. This information is not intended to replace advice given to you by your health care provider. Make sure you discuss any questions you have with your health care provider. Document Released: 01/13/2005 Document Revised: 05/15/2016 Document Reviewed: 05/15/2016 Elsevier Interactive Patient Education  2017 [employer].

## 2016-09-21 NOTE — Progress Notes (Signed)
Subjective:    Patient ID: Ruth Walker, female    DOB: 09-07-1945, 71 y.o.   MRN: [id number] Chief Complaint  Patient presents with  . Nevus    left side- mole    HPI Asked about a mold she had at her last visit. The provider told her he didn't know what it was but she should def get it checked by a dermatologist. She has been upset about the way he handled it and very nervous  - can't stop worrying about it. Made her BP very high.  Past Medical History:  Diagnosis Date  . Allergy   . Anal fissure   . Anxiety states   . Asthma    Gets allergy shots once a week   . Cholelithiasis   . Depression   . Diverticulosis   . Environmental allergies   . Hemorrhoids   . Irritable bowel syndrome (IBS)   . Osteoporosis   . Skin cancer    Leg  . Uterine polyp   . Vertebral compression fracture [facility])    Past Surgical History:  Procedure Laterality Date  . BACK SURGERY    . CESAREAN SECTION    . DILATION AND CURETTAGE OF UTERUS    . Endometrial polypectomy    . FRACTURE SURGERY     neck (vertebrae)  . HEMORRHOID SURGERY    . HYSTEROSCOPY    . IBS    . MOUTH SURGERY    . SKIN CANCER EXCISION     Leg    Current Outpatient Prescriptions on File Prior to Visit  Medication Sig Dispense Refill  . ALPRAZolam (XANAX) 0.25 MG tablet Take 1 tablet (0.25 mg total) by mouth 2 (two) times daily as needed for anxiety. 60 tablet 2  . aspirin 81 MG tablet Take 81 mg by mouth daily.      . benzonatate (TESSALON) 100 MG capsule Take 1-2 capsules (100-200 mg total) by mouth 3 (three) times daily as needed for cough. 60 capsule 0  . Calcium Carbonate (CALCIUM 600 PO) Take 1 tablet by mouth 2 (two) times daily.    . cycloSPORINE (RESTASIS) 0.05 % ophthalmic emulsion 1 drop 2 (two) times daily.      Marland Kitchen estradiol (ESTRACE) 0.1 MG/GM vaginal cream Place 4.13 Applicatorfuls vaginally 2 (two) times a week. 42.5 g 8  . Guaifenesin (MUCINEX MAXIMUM STRENGTH) 1200 MG TB12 Take 1 tablet (1,200 mg total)  by mouth every 12 (twelve) hours as needed. 14 tablet 1  . hydrochlorothiazide (HYDRODIURIL) 25 MG tablet Take 1 tablet (25 mg total) by mouth daily. 90 tablet 3  . loratadine (CLARITIN) 10 MG tablet Take 1 tablet (10 mg total) by mouth daily. 30 tablet 11  . montelukast (SINGULAIR) 10 MG tablet Take 1 tablet (10 mg total) by mouth at bedtime. 30 tablet 3  . polyethylene glycol (MIRALAX / GLYCOLAX) packet Take 17 g by mouth daily. Reported on 07/10/2015    . PRESCRIPTION MEDICATION Allergy injection once a week.[facility] [employer] [facility]    . Probiotic Product (ALIGN) 4 MG CAPS Take 1 capsule by mouth daily. 21 capsule 0  . sodium chloride (OCEAN) 0.65 % SOLN nasal spray Place 1 spray into both nostrils every 2 (two) hours. 1 Bottle 11  . triamcinolone cream (KENALOG) 0.1 % Apply 1 application topically 2 (two) times daily. 30 g 0  . Wheat Dextrin (BENEFIBER DRINK MIX PO) Take 10 mLs by mouth every morning. Take 2 teaspoons every morning      . [  DISCONTINUED] Linaclotide (LINZESS) 145 MCG CAPS Take 1 capsule by mouth daily. 8 capsule 0   No current facility-administered medications on file prior to visit.    Allergies  Allergen Reactions  . Aloe   . Benadryl [Diphenhydramine Hcl] Other (See Comments)    Makes her feel "hyper"  . Cephalexin     REACTION: coarse rash  . Ciprofloxacin   . Mucinex [Guaifenesin Er] Other (See Comments)    Makes her feel worse  . Shellfish Allergy Hives  . Sudafed [Pseudoephedrine Hcl] Hives and Other (See Comments)    Itching and hives asthma  . Sulfonamide Derivatives    Family History  Problem Relation Age of Onset  . Diabetes Mother   . Hypertension Mother   . Thyroid disease Mother   . Hypertension Father   . Heart disease Father   . Thyroid disease Sister   . Colon cancer Neg Hx    Social History   Social History  . Marital status: Married    Spouse name: N/A  . Number of children: 2  . Years of education: N/A   Occupational  History  . Retired    Social History Main Topics  . Smoking status: Never Smoker  . Smokeless tobacco: Never Used  . Alcohol use No  . Drug use: No  . Sexual activity: Yes    Birth control/ protection: Post-menopausal     Comment: number of sex partners in the last 47 months  1   Other Topics Concern  . None   Social History Narrative   Patient gets regular exercise. Cares for her grandchildren.   Depression screen [facility] 2/9 09/21/2016 09/09/2016 06/26/2016 04/04/2016 03/09/2016  Decreased Interest 0 0 0 0 0  Down, Depressed, Hopeless 0 0 0 0 0  PHQ - 2 Score 0 0 0 0 0  Altered sleeping - - - - -  Tired, decreased energy - - - - -  Change in appetite - - - - -  Feeling bad or failure about yourself  - - - - -  Trouble concentrating - - - - -  Moving slowly or fidgety/restless - - - - -  Suicidal thoughts - - - - -  PHQ-9 Score - - - - -  Difficult doing work/chores - - - - -     Review of Systems See hpi    Objective:   Physical Exam  Constitutional: She is oriented to person, place, and time. She appears well-developed and well-nourished. No distress.  HENT:  Head: Normocephalic and atraumatic.  Right Ear: External ear normal.  Left Ear: External ear normal.  Eyes: Conjunctivae are normal. No scleral icterus.  Neck: Normal range of motion. Neck supple. No thyromegaly present.  Cardiovascular: Normal rate, regular rhythm, normal heart sounds and intact distal pulses.   Pulmonary/Chest: Effort normal and breath sounds normal. No respiratory distress.  Musculoskeletal: She exhibits no edema.  Lymphadenopathy:    She has no cervical adenopathy.  Neurological: She is alert and oriented to person, place, and time.  Skin: Skin is warm and dry. She is not diaphoretic. No erythema.  Psychiatric: She has a normal mood and affect. Her behavior is normal.      BP (!) 171/82 (BP Location: Right Arm, Patient Position: Sitting, Cuff Size: Small)   Pulse 72   Temp 97.8 F (36.6  C) (Oral)   Ht 5\' 5"  (1.651 m)   Wt 136 lb (61.7 kg)   SpO2 99%   BMI  22.63 kg/m      Assessment & Plan:   1. Seborrheic keratoses, inflamed   Removed by cryosurgery today. Pt tolerated well w/o complication.  2. HTN - secondary to anxiety. Pt will monitor at home. [name], M.D.  Primary Care at [facility] [address] [place], [place] [address] [phone] phone [phone] fax  11/05/16 7:26 PM

## 2016-10-16 ENCOUNTER — Telehealth: Payer: Self-pay | Admitting: Family Medicine

## 2016-10-16 ENCOUNTER — Other Ambulatory Visit: Payer: Self-pay | Admitting: Family Medicine

## 2016-10-16 MED ORDER — METOPROLOL TARTRATE 25 MG PO TABS: 25.0000 mg | ORAL_TABLET | Freq: Two times a day (BID) | ORAL | 0 refills | Status: DC

## 2016-10-16 NOTE — Telephone Encounter (Signed)
Patient Need refill on Metoprolol

## 2016-11-14 ENCOUNTER — Ambulatory Visit (INDEPENDENT_AMBULATORY_CARE_PROVIDER_SITE_OTHER): Payer: Medicare Other | Admitting: Student

## 2016-11-14 ENCOUNTER — Encounter: Payer: Self-pay | Admitting: Student

## 2016-11-14 DIAGNOSIS — J309 Allergic rhinitis, unspecified: Secondary | ICD-10-CM | POA: Insufficient documentation

## 2016-11-14 DIAGNOSIS — J301 Allergic rhinitis due to pollen: Secondary | ICD-10-CM

## 2016-11-14 NOTE — Progress Notes (Signed)
Subjective:    Patient ID: Ruth Walker, female    DOB: April 29, 1946, 71 y.o.   MRN: [id number]  HPI Presents with concerns for a sinus infection, but is not sure if it is allergy related.  She has severe allergies and takes allergy shots.  She has missed them over the past couple weeks because her 36 y/o mother had norovirus.  She wants to make sure she does not have an infection that she can spread to her mother.  She reports nasal congestion that is unchanged from her usual nasal congestion.  She denies malaise, increased fatigue, sore throat, but does have a scratchy throat.  Denies earache, nausea, vomiting, diarrhea, shortness of breath.  Mild cough without production.  Clear discharge.   PMHx - Updated and reviewed.  Contributory factors include: Anxiety, allergies, asthma, OCD PSHx - Updated and reviewed.  Contributory factors include:  Negative FHx - Updated and reviewed.  Contributory factors include:  Negative Social Hx - Updated and reviewed. Contributory factors include: Negative Medications - reviewed   Review of Systems  Constitutional: Negative for chills, fatigue and fever.  HENT: Positive for congestion, postnasal drip, sinus pressure and sore throat. Negative for ear discharge, ear pain, nosebleeds, rhinorrhea, sinus pain and trouble swallowing.   Eyes: Negative for redness and itching.  Respiratory: Positive for cough. Negative for chest tightness, shortness of breath and wheezing.   Cardiovascular: Negative for chest pain, palpitations and leg swelling.  Gastrointestinal: Negative for abdominal pain and nausea.  Genitourinary: Negative for dysuria and urgency.  Musculoskeletal: Negative for arthralgias and joint swelling.  Skin: Negative for rash and wound.  Psychiatric/Behavioral: Negative for agitation, confusion, self-injury and sleep disturbance. The patient is nervous/anxious.   All other systems reviewed and are negative.      Objective:   Physical Exam    Constitutional: She is oriented to person, place, and time. She appears well-developed and well-nourished. No distress.  HENT:  Head: Normocephalic and atraumatic.  Right Ear: External ear normal.  Left Ear: External ear normal.  Mouth/Throat: Oropharynx is clear and moist. No oropharyngeal exudate.  Mild erythema in nasal turbinates with clear discharge  Eyes: Conjunctivae are normal. Pupils are equal, round, and reactive to light.  Neck: Normal range of motion. Neck supple.  Cardiovascular: Normal rate, regular rhythm, normal heart sounds and intact distal pulses.  Exam reveals no gallop and no friction rub.   No murmur heard. Pulmonary/Chest: Effort normal and breath sounds normal. No respiratory distress. She has no wheezes. She has no rales. She exhibits no tenderness.  Musculoskeletal: Normal range of motion. She exhibits no edema.  Neurological: She is alert and oriented to person, place, and time.  Skin: Skin is warm. No rash noted. She is not diaphoretic. No erythema.  Psychiatric: She has a normal mood and affect. Her behavior is normal. Judgment and thought content normal.  Nursing note and vitals reviewed.  BP (!) 150/88   Pulse 92   Temp 98.7 F (37.1 C)   Resp 17   Ht 5' 5.5" (1.664 m)   Wt 135 lb (61.2 kg)   SpO2 97%   BMI 22.12 kg/m         Assessment & Plan:  Allergic rhinitis Recommend continuing allergy medications.  Use netti pot.  Allergy shots when can get over to allergist.  Reassurance given.  Originally had low grade fever, but had been sitting in car without air conditioning.  Normalized at this time.  Call if not  resolving by next week and would consider augmentin at that time.  Doubt infectious source at this time, but could be an early viral infection.  Gave precautions for avoiding spreading viral infection, although likely allergy source.   Had elevated BP, recommended f/u if continues to be high at home.  Has white coat syndrome.  F/u with Dr.  Brigitte Pulse if still elevated at home.  Signed,  Balinda Quails, DO [facility] 5:17 PM 11/14/16

## 2016-11-14 NOTE — Patient Instructions (Signed)
     IF you received an x-ray today, you will receive an invoice from [facility]. Please contact [facility] at [phone] with questions or concerns regarding your invoice.   IF you received labwork today, you will receive an invoice from [facility]. Please contact [facility] at [phone] with questions or concerns regarding your invoice.   Our billing staff will not be able to assist you with questions regarding bills from these companies.  You will be contacted with the lab results as soon as they are available. The fastest way to get your results is to activate your My Chart account. Instructions are located on the last page of this paperwork. If you have not heard from us regarding the results in 2 weeks, please contact this office.     

## 2016-11-14 NOTE — Assessment & Plan Note (Signed)
Recommend continuing allergy medications.  Use netti pot.  Allergy shots when can get over to allergist.  Reassurance given.  Originally had low grade fever, but had been sitting in car without air conditioning.  Normalized at this time.  Call if not resolving by next week and would consider augmentin at that time.  Doubt infectious source at this time, but could be an early viral infection.  Gave precautions for avoiding spreading viral infection, although likely allergy source.

## 2016-11-22 ENCOUNTER — Encounter: Payer: Self-pay | Admitting: Gynecology

## 2016-11-28 ENCOUNTER — Ambulatory Visit (INDEPENDENT_AMBULATORY_CARE_PROVIDER_SITE_OTHER): Payer: Medicare Other | Admitting: Physician Assistant

## 2016-11-28 ENCOUNTER — Telehealth: Payer: Self-pay | Admitting: Physician Assistant

## 2016-11-28 ENCOUNTER — Encounter: Payer: Self-pay | Admitting: Physician Assistant

## 2016-11-28 ENCOUNTER — Telehealth: Payer: Self-pay

## 2016-11-28 VITALS — BP 134/78 | HR 76 | Temp 97.3°F | Resp 18

## 2016-11-28 DIAGNOSIS — K600 Acute anal fissure: Secondary | ICD-10-CM | POA: Diagnosis not present

## 2016-11-28 DIAGNOSIS — K649 Unspecified hemorrhoids: Secondary | ICD-10-CM

## 2016-11-28 DIAGNOSIS — Z630 Problems in relationship with spouse or partner: Secondary | ICD-10-CM | POA: Diagnosis not present

## 2016-11-28 DIAGNOSIS — I10 Essential (primary) hypertension: Secondary | ICD-10-CM | POA: Diagnosis not present

## 2016-11-28 DIAGNOSIS — R21 Rash and other nonspecific skin eruption: Secondary | ICD-10-CM

## 2016-11-28 MED ORDER — DILTIAZEM GEL 2 %: 1.0000 "application " | Freq: Two times a day (BID) | CUTANEOUS | 0 refills | Status: DC

## 2016-11-28 MED ORDER — HYDROCORTISONE ACETATE 25 MG RE SUPP: 25.0000 mg | Freq: Two times a day (BID) | RECTAL | 0 refills | Status: DC
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
## 2016-11-28 NOTE — Telephone Encounter (Signed)
Patient called in and said she was "actively bleeding" extremely bad.  Per Barb Merino, RN advised patient to go to ED.

## 2016-11-28 NOTE — Patient Instructions (Addendum)
Use the hydrocortisone cream that you can get over the counter - use once a day on the dry skin in the vaginal area -- you can also use either aquaphor or destin or diaper rash ointment to protect the area from your underwear elastic  Use the suppositories to help with possible internal hemorrhoids  Use the cream on the fissure to help it heal - remember try not to strain when having a BM    We recommend that you schedule a mammogram for breast cancer screening. Typically, you do not need a referral to do this. Please contact a local imaging center to schedule your mammogram.  [facility] - [phone]  *ask for the Radiology Department [facility] - [phone] or [phone]  [facility] - [phone] [place] [phone] [facility] - [phone]  *ask for the [facility] - [phone]  *ask for the Radiology Department [facility] - [phone]  *ask for the [place] - [phone]   IF you received an x-ray today, you will receive an invoice from [facility] Radiology. Please contact [facility] Radiology at [phone] with questions or concerns regarding your invoice.   IF you received labwork today, you will receive an invoice from [place]. Please contact [facility] at [phone] with questions or concerns regarding your invoice.   Our billing staff will not be able to assist you with questions regarding bills from these companies.  You will be contacted with the lab results as soon as they are available. The fastest way to get your results is to activate your My Chart account. Instructions are located on the last page of this paperwork. If you have not heard from [place] regarding the results in 2 weeks, please contact this office.

## 2016-11-28 NOTE — Telephone Encounter (Signed)
Pt wanted a call before order was filled. Advised pt of cost of medication. Stated that she cannot afford the medication but pharmacy can fill it anyway and she will figure something out. Order for diltiazem gel 2% was faxed to [facility]. Confirmation fax received.

## 2016-12-01 ENCOUNTER — Telehealth: Payer: Self-pay | Admitting: *Deleted

## 2016-12-01 ENCOUNTER — Ambulatory Visit (INDEPENDENT_AMBULATORY_CARE_PROVIDER_SITE_OTHER): Payer: Medicare Other | Admitting: Gastroenterology

## 2016-12-01 ENCOUNTER — Encounter: Payer: Self-pay | Admitting: Gastroenterology

## 2016-12-01 VITALS — BP 140/80 | HR 80 | Ht 65.0 in | Wt 133.1 lb

## 2016-12-01 DIAGNOSIS — K649 Unspecified hemorrhoids: Secondary | ICD-10-CM

## 2016-12-01 DIAGNOSIS — L290 Pruritus ani: Secondary | ICD-10-CM

## 2016-12-01 MED ORDER — HYDROCORTISONE ACETATE 25 MG RE SUPP: RECTAL | 2 refills | Status: DC

## 2016-12-01 NOTE — Patient Instructions (Signed)
We have sent the following medications to your pharmacy for you to pick up at your convenience: [place], [place].  1. Anusol HC Suppositories.   We have provided you with IB Gard samples with a coupon.  Get Water wipes in the baby section. You can increase Miralax to twice dialy.

## 2016-12-01 NOTE — Progress Notes (Signed)
Reviewed and agree with initial management plan.  Kenlee Maler T. Bernhard Koskinen, MD FACG 

## 2016-12-01 NOTE — Telephone Encounter (Signed)
The patient asked if she can do her gym activites, stretching, 3 lb hand weights, stationary bike.  Per Alonza Bogus PA-C, she can do all but the bike for right now.  Hold off on that for maybe a week.  The patient verbalized  Understanding the instructions.

## 2016-12-01 NOTE — Progress Notes (Signed)
12/01/2016 Ruth Walker [id number] 1946-06-22   HISTORY OF PRESENT ILLNESS:  This is a 71 year old female who is previously a patient of Dr. Buel Ream. Her care has been seen by Dr. Fuller Plan. She's been seen by one of our other PAs on several occasions for complaints of hemorrhoids and anal fissures. The last time she was seen here was in October 2015. Her last colonoscopy was in March 2010 at which time she was found have mild diverticulosis. She presents to our office today with similar complaints. Says that 3 days ago she had small amounts of rectal bleeding. She describes some mild to moderate discomfort in her rectum. She said that she was seen at urgent care for this the other day and was told that she had a fissure. She was prescribed diltiazem gel but she has not picked that up yet. She wanted to be seen here first and get our opinion.  Admits to extremely aggressive perianal hygiene.  Has constipation, which has overall been stable on Miralax daily but occasionally still has mild issues.  Patient is extremely anxious and becomes worried easily by small amounts of blood.  Past Medical History:  Diagnosis Date  . Allergy   . Anal fissure   . Anxiety states   . Asthma    Gets allergy shots once a week   . Cholelithiasis   . Depression   . Diverticulosis   . Environmental allergies   . Hemorrhoids   . Irritable bowel syndrome (IBS)   . Osteoporosis   . Skin cancer    Leg  . Uterine polyp   . Vertebral compression fracture [facility])    Past Surgical History:  Procedure Laterality Date  . BACK SURGERY    . CESAREAN SECTION    . DILATION AND CURETTAGE OF UTERUS    . Endometrial polypectomy    . FRACTURE SURGERY     neck (vertebrae)  . HEMORRHOID SURGERY    . HYSTEROSCOPY    . IBS    . MOUTH SURGERY    . SKIN CANCER EXCISION     Leg     reports that she has never smoked. She has never used smokeless tobacco. She reports that she does not drink alcohol or use  drugs. family history includes Diabetes in her mother; Heart disease in her father; Hypertension in her father and mother; Thyroid disease in her mother and sister. Allergies  Allergen Reactions  . Aloe   . Benadryl [Diphenhydramine Hcl] Other (See Comments)    Makes her feel "hyper"  . Cephalexin     REACTION: coarse rash  . Ciprofloxacin   . Mucinex [Guaifenesin Er] Other (See Comments)    Makes her feel worse  . Shellfish Allergy Hives  . Sudafed [Pseudoephedrine Hcl] Hives and Other (See Comments)    Itching and hives asthma  . Sulfonamide Derivatives       Outpatient Encounter Prescriptions as of 12/01/2016  Medication Sig  . ALPRAZolam (XANAX) 0.25 MG tablet Take 1 tablet (0.25 mg total) by mouth 2 (two) times daily as needed for anxiety.  Marland Kitchen aspirin 81 MG tablet Take 81 mg by mouth daily.    . Calcium Carbonate (CALCIUM 600 PO) Take 1 tablet by mouth 2 (two) times daily.  . cycloSPORINE (RESTASIS) 0.05 % ophthalmic emulsion 1 drop 2 (two) times daily.    Marland Kitchen diltiazem 2 % GEL Apply 1 application topically 2 (two) times daily.  Marland Kitchen estradiol (ESTRACE) 0.1 MG/GM vaginal cream Place  6.59 Applicatorfuls vaginally 2 (two) times a week.  . hydrochlorothiazide (HYDRODIURIL) 25 MG tablet Take 1 tablet (25 mg total) by mouth daily.  . hydrocortisone (ANUSOL-HC) 25 MG suppository Use 1 suppository at bedtime.  Marland Kitchen loratadine (CLARITIN) 10 MG tablet Take 1 tablet (10 mg total) by mouth daily.  . metoprolol tartrate (LOPRESSOR) 25 MG tablet Take 1 tablet (25 mg total) by mouth 2 (two) times daily.  . polyethylene glycol (MIRALAX / GLYCOLAX) packet Take 17 g by mouth daily. Reported on 07/10/2015  . PRESCRIPTION MEDICATION Allergy injection once a week.[facility] [employer]  . Probiotic Product (ALIGN) 4 MG CAPS Take 1 capsule by mouth daily.  . sodium chloride (OCEAN) 0.65 % SOLN nasal spray Place 1 spray into both nostrils every 2 (two) hours.  . triamcinolone cream (KENALOG) 0.1 %  Apply 1 application topically 2 (two) times daily.  . Wheat Dextrin (BENEFIBER DRINK MIX PO) Take 10 mLs by mouth every morning. Take 2 teaspoons every morning    . [DISCONTINUED] hydrocortisone (ANUSOL-HC) 25 MG suppository Place 1 suppository (25 mg total) rectally 2 (two) times daily.   No facility-administered encounter medications on file as of 12/01/2016.      REVIEW OF SYSTEMS  : All other systems reviewed and negative except where noted in the History of Present Illness.   PHYSICAL EXAM: BP 140/80 (BP Location: Left Arm, Patient Position: Sitting, Cuff Size: Normal)   Pulse 80   Ht 5\' 5"  (1.651 m) Comment: Height measured without shoes  Wt 133 lb 2 oz (60.4 kg)   BMI 22.15 kg/m  General: Well developed white female in no acute distress Head: Normocephalic and atraumatic Eyes:  Sclerae anicteric,conjunctive pink. Ears: Normal auditory acuity Neck: Supple, no masses.  Lungs: Clear throughout to auscultation; no increased WOB. Heart: Regular rate and rhythm Abdomen: Soft, non-distended.  BS present.  Non-tender. Rectal:  A lot of perianal irritation seen.  No external hemorrhoids or fissure noted on exam today.  DRE did not reveal any mass; light brown stool noted on exam glove.  Anoscopy revealed internal hemorrhoids, one posteriorly that was large and inflamed with stigmata of recent bleeding. Musculoskeletal: Symmetrical with no gross deformities  Skin: No lesions on visible extremities Extremities: No edema  Neurological: Alert oriented x 4, grossly non-focal Psychological:  Alert and cooperative. Normal mood and affect  ASSESSMENT AND PLAN: -Rectal bleeding:  Secondary to internal hemorrhoids seen on exam today.  Will treat with anusol suppositories. -Constipation:  Overall stable.  Can increase Miralax to BID if needed. -Anal/rectal pain:  I did not see a fissure today.  She tolerated a complete exam, including anoscopy.  She has a lot of perianal irritation and admits  to [employer].  I have advised her to keep the area clean and dry but to not use scented wipes or clean to aggressively.  I advised to use Desitin or other zinc-oxide product to the perianal area several times per day.   CC:  Shawnee Knapp, MD

## 2017-03-02 ENCOUNTER — Ambulatory Visit (INDEPENDENT_AMBULATORY_CARE_PROVIDER_SITE_OTHER): Payer: Medicare Other | Admitting: Physician Assistant

## 2017-03-02 ENCOUNTER — Encounter: Payer: Self-pay | Admitting: Physician Assistant

## 2017-03-02 VITALS — BP 127/75 | HR 89 | Temp 97.3°F | Resp 16 | Ht 65.0 in | Wt 134.4 lb

## 2017-03-02 DIAGNOSIS — R0982 Postnasal drip: Secondary | ICD-10-CM | POA: Diagnosis not present

## 2017-03-02 DIAGNOSIS — R0981 Nasal congestion: Secondary | ICD-10-CM | POA: Diagnosis not present

## 2017-03-02 DIAGNOSIS — J3489 Other specified disorders of nose and nasal sinuses: Secondary | ICD-10-CM | POA: Diagnosis not present

## 2017-03-02 DIAGNOSIS — J309 Allergic rhinitis, unspecified: Secondary | ICD-10-CM | POA: Diagnosis not present

## 2017-03-02 MED ORDER — FLUTICASONE PROPIONATE 50 MCG/ACT NA SUSP: 2.0000 | Freq: Every day | NASAL | 0 refills | Status: DC

## 2017-03-02 NOTE — Progress Notes (Signed)
MRN: [id number] DOB: [birth year]  Subjective:   Ruth Walker is a 71 y.o. female presenting for chief complaint of drainage (says she has drainage down her throatg) and Flu Vaccine (if its okay with the provider) .  Reports 1 week  history of post nasal drip and tickling/scratching in throat. Just started getting some sinus pressure yesterday. Having some sneezing and itchy watery eyes. Denies fever, chills, nausea, vomiting, cough, and ear pain. Has not had sick contact with anyone. Has history of seasonal allergies and history of asthma. She gets allergy shots weekly. Was not able to get allergy shots last week but when and got it yesterday but they were out of her serum so she cannot get it until next week. She has taken loratadine daily but has ran out of flonase. Patient has not had flu shot this season. No smoking. Denies any other aggravating or relieving factors, no other questions or concerns.  Ruth Walker has a current medication list which includes the following prescription(s): alprazolam, aspirin, calcium carbonate, cyclosporine, diltiazem, estradiol, hydrochlorothiazide, hydrocortisone, loratadine, metoprolol tartrate, polyethylene glycol, PRESCRIPTION MEDICATION, align, sodium chloride, triamcinolone cream, wheat dextrin, and fluticasone. Also is allergic to aloe; benadryl [diphenhydramine hcl]; cephalexin; ciprofloxacin; mucinex [guaifenesin er]; shellfish allergy; sudafed [pseudoephedrine hcl]; and sulfonamide derivatives.  Ruth Walker  has a past medical history of Allergy; Anal fissure; Anxiety states; Asthma; Cholelithiasis; Depression; Diverticulosis; Environmental allergies; Hemorrhoids; Irritable bowel syndrome (IBS); Osteoporosis; Skin cancer; Uterine polyp; and Vertebral compression fracture ([place]). Also  has a past surgical history that includes Cesarean section; Hemorrhoid surgery; Back surgery; Skin cancer excision; Endometrial polypectomy; Hysteroscopy; Dilation and curettage of  uterus; Mouth surgery; Fracture surgery; and IBS.   Objective:   Vitals: BP 127/75 (BP Location: Right Arm, Patient Position: Sitting, Cuff Size: Normal)   Pulse 89   Temp (!) 97.3 F (36.3 C) (Oral)   Resp 16   Ht 5\' 5"  (1.651 m)   Wt 134 lb 6.4 oz (61 kg)   SpO2 100%   BMI 22.37 kg/m   Physical Exam  Constitutional: She is oriented to person, place, and time. She appears well-developed and well-nourished. No distress.  HENT:  Head: Normocephalic and atraumatic.  Right Ear: Tympanic membrane, external ear and ear canal normal.  Left Ear: Tympanic membrane, external ear and ear canal normal.  Nose: Mucosal edema (mild) and rhinorrhea present. Right sinus exhibits maxillary sinus tenderness (mild). Left sinus exhibits maxillary sinus tenderness (mild).  Mouth/Throat: Uvula is midline and mucous membranes are normal. Posterior oropharyngeal erythema (cobblestoning noted) present. No tonsillar exudate.  Eyes: Conjunctivae are normal.  Neck: Normal range of motion.  Cardiovascular: Normal rate, regular rhythm and normal heart sounds.   Pulmonary/Chest: Effort normal and breath sounds normal. She has no wheezes. She has no rales.  Neurological: She is alert and oriented to person, place, and time.  Skin: Skin is warm and dry.  Psychiatric: She has a normal mood and affect.  Vitals reviewed.   No results found for this or any previous visit (from the past 24 hour(s)).  Assessment and Plan :  1. Post-nasal drip 2. Sinus pressure 3. Nasal congestion 4. Allergic rhinitis, unspecified seasonality, unspecified trigger Hx and PE findings consistent with uncontrolled allergies. Pt encouraged to use flonase, loratadine, and nasal saline rinses. Hopefully she will be able to get her allergy shots next week. Instructed that if she is still having sinus pressure on Monday, 03/05/17, to contact our office and I will consider short course of prednisone  at that time. Return sooner if symptoms  worsen.  - fluticasone (FLONASE) 50 MCG/ACT nasal spray; Place 2 sprays into both nostrils daily.  Dispense: 16 g; Refill: 0  Tenna Delaine, PA-C  [facility] 03/02/2017 11:42 AM

## 2017-03-02 NOTE — Patient Instructions (Addendum)
Your symptoms are consistent with allergies. I recommend starting flonase today and continuing loratadine. I also recommend nasal saline rinses and drinking warm tea with honey and ginger for your throat. If your sinus pressure does not resolve with the current treatment plan, please contact our office on Monday and we can try oral prednisone at that time. If you develop any fever, chills, or cough please return to office for further evaluation. Thank you for letting me participate in your health and well being.  Allergic Rhinitis Allergic rhinitis is when the mucous membranes in the nose respond to allergens. Allergens are particles in the air that cause your body to have an allergic reaction. This causes you to release allergic antibodies. Through a chain of events, these eventually cause you to release histamine into the blood stream. Although meant to protect the body, it is this release of histamine that causes your discomfort, such as frequent sneezing, congestion, and an itchy, runny nose. What are the causes? Seasonal allergic rhinitis (hay fever) is caused by pollen allergens that may come from grasses, trees, and weeds. Year-round allergic rhinitis (perennial allergic rhinitis) is caused by allergens such as house dust mites, pet dander, and mold spores. What are the signs or symptoms?  Nasal stuffiness (congestion).  Itchy, runny nose with sneezing and tearing of the eyes. How is this diagnosed? Your health care provider can help you determine the allergen or allergens that trigger your symptoms. If you and your health care provider are unable to determine the allergen, skin or blood testing may be used. Your health care provider will diagnose your condition after taking your health history and performing a physical exam. Your health care provider may assess you for other related conditions, such as asthma, pink eye, or an ear infection. How is this treated? Allergic rhinitis does not have a  cure, but it can be controlled by:  Medicines that block allergy symptoms. These may include allergy shots, nasal sprays, and oral antihistamines.  Avoiding the allergen.  Hay fever may often be treated with antihistamines in pill or nasal spray forms. Antihistamines block the effects of histamine. There are over-the-counter medicines that may help with nasal congestion and swelling around the eyes. Check with your health care provider before taking or giving this medicine. If avoiding the allergen or the medicine prescribed do not work, there are many new medicines your health care provider can prescribe. Stronger medicine may be used if initial measures are ineffective. Desensitizing injections can be used if medicine and avoidance does not work. Desensitization is when a patient is given ongoing shots until the body becomes less sensitive to the allergen. Make sure you follow up with your health care provider if problems continue. Follow these instructions at home: It is not possible to completely avoid allergens, but you can reduce your symptoms by taking steps to limit your exposure to them. It helps to know exactly what you are allergic to so that you can avoid your specific triggers. Contact a health care provider if:  You have a fever.  You develop a cough that does not stop easily (persistent).  You have shortness of breath.  You start wheezing.  Symptoms interfere with normal daily activities. This information is not intended to replace advice given to you by your health care provider. Make sure you discuss any questions you have with your health care provider. Document Released: 03/21/2001 Document Revised: 02/25/2016 Document Reviewed: 03/03/2013 Elsevier Interactive Patient Education  2017 [employer].  IF you received an x-ray today, you will receive an invoice from [facility] Radiology. Please contact [facility] Radiology at [phone] with questions or concerns  regarding your invoice.   IF you received labwork today, you will receive an invoice from [place]. Please contact [facility] at [phone] with questions or concerns regarding your invoice.   Our billing staff will not be able to assist you with questions regarding bills from these companies.  You will be contacted with the lab results as soon as they are available. The fastest way to get your results is to activate your My Chart account. Instructions are located on the last page of this paperwork. If you have not heard from [place] regarding the results in 2 weeks, please contact this office.

## 2017-03-27 ENCOUNTER — Other Ambulatory Visit: Payer: Self-pay | Admitting: Family Medicine

## 2017-05-08 ENCOUNTER — Other Ambulatory Visit: Payer: Self-pay | Admitting: Family Medicine

## 2017-05-08 DIAGNOSIS — F411 Generalized anxiety disorder: Secondary | ICD-10-CM

## 2017-05-15 ENCOUNTER — Encounter: Payer: Self-pay | Admitting: Physician Assistant

## 2017-05-15 ENCOUNTER — Ambulatory Visit: Payer: Medicare Other | Admitting: Physician Assistant

## 2017-05-15 VITALS — BP 185/91 | HR 92 | Temp 98.7°F | Resp 16 | Ht 65.0 in | Wt 135.8 lb

## 2017-05-15 DIAGNOSIS — Z23 Encounter for immunization: Secondary | ICD-10-CM | POA: Diagnosis not present

## 2017-05-15 DIAGNOSIS — R0981 Nasal congestion: Secondary | ICD-10-CM

## 2017-05-15 DIAGNOSIS — J029 Acute pharyngitis, unspecified: Secondary | ICD-10-CM | POA: Diagnosis not present

## 2017-05-15 LAB — POCT RAPID STREP A (OFFICE): Rapid Strep A Screen: NEGATIVE

## 2017-05-15 MED ORDER — FLUTICASONE PROPIONATE 50 MCG/ACT NA SUSP: 2.0000 | Freq: Every day | NASAL | 12 refills | Status: DC

## 2017-05-15 NOTE — Progress Notes (Signed)
Ruth Walker  MRN: [id number] DOB: 1946/04/01  PCP: Ruth Knapp, MD  Subjective:  Pt is a 71 year old female PMH HTN, DVT, IBS, asthma anxiety who presents to clinic for sore throat x 4 days.  "scratchy" feeling. Denies fever, chills, difficulty breathing or swallowing, pain with swallowing, voice changes. She has not taken anything to feel better. She is staying well hydrated. No known sick contacts.   She endorses increased home stress. Her daughter is going through a "mid-life crisis". Daughter's husband was not paying enough attention to her x 1 year, she met a man in [place] and they are in love, she went down to see him for the first time last weekend and is thinking about moving. Pt is very upset and scared about the future of her three grandchildren. She helps take care of them.   Review of Systems  Constitutional: Negative for chills, diaphoresis, fatigue and fever.  HENT: Positive for postnasal drip and sore throat. Negative for congestion, ear discharge, ear pain, rhinorrhea, sinus pressure, sinus pain, sneezing, trouble swallowing and voice change.   Respiratory: Negative for cough, shortness of breath and wheezing.   Cardiovascular: Negative for palpitations.  Skin: Negative for rash.    Patient Active Problem List   Diagnosis Date Noted  . Perianal irritation 12/01/2016  . Allergic rhinitis 11/14/2016  . External hemorrhoids 03/04/2012  . DVT (deep venous thrombosis) ([place])   . Uterine polyp   . Osteoporosis   . Vertebral compression fracture ([place])   . IBS (irritable bowel syndrome) 09/05/2011  . Constipation, slow transit 09/05/2011  . Anxiety disorder 09/05/2011  . Diverticulosis 11/04/2010  . Gallstones 11/04/2010  . ANAL FISSURE 05/26/2008  . OBSESSIVE-COMPULSIVE PERSONALITY DISORDER 02/28/2008  . Essential hypertension 02/28/2008  . Hemorrhoids 02/28/2008  . ASTHMA 02/28/2008  . CYSTITIS, CHRONIC 02/28/2008    Current Outpatient Medications on File  Prior to Visit  Medication Sig Dispense Refill  . ALPRAZolam (XANAX) 0.25 MG tablet TAKE 1 TABLET BY MOUTH TWICE DAILY AS NEEDED FOR ANXIETY 90 tablet 0  . aspirin 81 MG tablet Take 81 mg by mouth daily.      . Calcium Carbonate (CALCIUM 600 PO) Take 1 tablet by mouth 2 (two) times daily.    . cycloSPORINE (RESTASIS) 0.05 % ophthalmic emulsion 1 drop 2 (two) times daily.      Marland Kitchen estradiol (ESTRACE) 0.1 MG/GM vaginal cream Place 3.50 Applicatorfuls vaginally 2 (two) times a week. 42.5 g 8  . hydrochlorothiazide (HYDRODIURIL) 25 MG tablet Take 1 tablet (25 mg total) by mouth daily. 90 tablet 3  . loratadine (CLARITIN) 10 MG tablet TAKE 1 TABLET(10 MG) BY MOUTH DAILY 30 tablet 11  . metoprolol tartrate (LOPRESSOR) 25 MG tablet Take 1 tablet (25 mg total) by mouth 2 (two) times daily. 180 tablet 0  . polyethylene glycol (MIRALAX / GLYCOLAX) packet Take 17 g by mouth daily. Reported on 07/10/2015    . PRESCRIPTION MEDICATION Allergy injection once a week.[facility] [employer]    . Probiotic Product (ALIGN) 4 MG CAPS Take 1 capsule by mouth daily. 21 capsule 0  . sodium chloride (OCEAN) 0.65 % SOLN nasal spray Place 1 spray into both nostrils every 2 (two) hours. 1 Bottle 11  . Wheat Dextrin (BENEFIBER DRINK MIX PO) Take 10 mLs by mouth every morning. Take 2 teaspoons every morning      . diltiazem 2 % GEL Apply 1 application topically 2 (two) times daily. (Patient not taking:  Reported on 05/15/2017) 30 g 0  . fluticasone (FLONASE) 50 MCG/ACT nasal spray Place 2 sprays into both nostrils daily. (Patient not taking: Reported on 05/15/2017) 16 g 0  . hydrocortisone (ANUSOL-HC) 25 MG suppository Use 1 suppository at bedtime. (Patient not taking: Reported on 05/15/2017) 10 suppository 2  . triamcinolone cream (KENALOG) 0.1 % Apply 1 application topically 2 (two) times daily. (Patient not taking: Reported on 05/15/2017) 30 g 0  . [DISCONTINUED] Linaclotide (LINZESS) 145 MCG CAPS Take 1 capsule by mouth  daily. 8 capsule 0   No current facility-administered medications on file prior to visit.     Allergies  Allergen Reactions  . Aloe   . Benadryl [Diphenhydramine Hcl] Other (See Comments)    Makes her feel "hyper"  . Cephalexin     REACTION: coarse rash  . Ciprofloxacin   . Mucinex [Guaifenesin Er] Other (See Comments)    Makes her feel worse  . Shellfish Allergy Hives  . Sudafed [Pseudoephedrine Hcl] Hives and Other (See Comments)    Itching and hives asthma  . Sulfonamide Derivatives      Objective:  BP (!) 185/91   Pulse 92   Temp 98.7 F (37.1 C) (Oral)   Resp 16   Ht 5' 5"  (1.651 m)   Wt 135 lb 12.8 oz (61.6 kg)   SpO2 100%   BMI 22.60 kg/m   Physical Exam  Constitutional: She is oriented to person, place, and time and well-developed, well-nourished, and in no distress. No distress.  HENT:  Right Ear: Tympanic membrane normal.  Left Ear: Tympanic membrane normal.  Nose: No mucosal edema or rhinorrhea. Right sinus exhibits no maxillary sinus tenderness and no frontal sinus tenderness. Left sinus exhibits no maxillary sinus tenderness and no frontal sinus tenderness.  Mouth/Throat: Oropharynx is clear and moist and mucous membranes are normal.  Cardiovascular: Normal rate, regular rhythm and normal heart sounds.  Neurological: She is alert and oriented to person, place, and time. GCS score is 15.  Skin: Skin is warm and dry.  Psychiatric: Mood, memory, affect and judgment normal.  Vitals reviewed.   Results for orders placed or performed in visit on 05/15/17  POCT rapid strep A  Result Value Ref Range   Rapid Strep A Screen Negative Negative    Assessment and Plan :  1. Sore throat - Culture, Group A Strep - POCT rapid strep A - Rapid strep is negative, culture is pending. Supportive care encouraged. Discussed at length pt's current home stressors. Advised her to speak with a therapist. RTC if symptoms worsen.  2. Nasal congestion - fluticasone (FLONASE)  50 MCG/ACT nasal spray; Place 2 sprays daily into both nostrils.  Dispense: 16 g; Refill: 12  3. Need for prophylactic vaccination and inoculation against influenza - Flu Vaccine QUAD 6+ mos PF IM (Fluarix Quad PF)   Mercer Pod, PA-C  [facility] 05/15/2017 10:14 AM

## 2017-05-15 NOTE — Patient Instructions (Addendum)
I agree with you about seeing a therapist. I think this will be very helpful to you. You may also consider keeping a journal.  Continue using your nasal rinses and taking antihistamines.    Stay well hydrated. Advil or ibuprofen for pain. Do not take Aspirin.  Throat lozenges (if you are not at risk for choking) or sprays may be used to soothe your throat. Drink enough water and fluids to keep your urine clear or pale yellow. For sore throat: ? Gargle with 8 oz of salt water ( tsp of salt per 1 qt of water) as often as every 1-2 hours to soothe your throat.  Gargle liquid benadryl.  Use Elderberry syrup.   For sore throat try using a honey-based tea. Use 3 teaspoons of honey with juice squeezed from half lemon. Place shaved pieces of ginger into 1/2-1 cup of water and warm over stove top. Then mix the ingredients and repeat every 4 hours as needed.  Cough Syrup Recipe: Sweet Lemon & Honey Thyme  Ingredients a handful of fresh thyme sprigs   1 pint of water (2 cups)  1/2 cup honey (raw is best, but regular will do)  1/2 lemon chopped Instructions 1. Place the lemon in the pint jar and cover with the honey. The honey will macerate the lemons and draw out liquids which taste so delicious! 2. Meanwhile, toss the thyme leaves into a saucepan and cover them with the water. 3. Bring the water to a gentle simmer and reduce it to half, about a cup of tea. 4. When the tea is reduced and cooled a bit, strain the sprigs & leaves, add it into the pint jar and stir it well. 5. Give it a shake and use a spoonful as needed. 6. Store your homemade cough syrup in the refrigerator for about a month.     IF you received an x-ray today, you will receive an invoice from [facility]. Please contact [facility] at [phone] with questions or concerns regarding your invoice.   IF you received labwork today, you will receive an invoice from [place]. Please contact [facility] at  [phone] with questions or concerns regarding your invoice.   Our billing staff will not be able to assist you with questions regarding bills from these companies.  You will be contacted with the lab results as soon as they are available. The fastest way to get your results is to activate your My Chart account. Instructions are located on the last page of this paperwork. If you have not heard from [place] regarding the results in 2 weeks, please contact this office.

## 2017-05-17 LAB — CULTURE, GROUP A STREP: Strep A Culture: NEGATIVE

## 2017-05-18 ENCOUNTER — Telehealth: Payer: Self-pay | Admitting: Physician Assistant

## 2017-05-18 NOTE — Telephone Encounter (Signed)
Copied from [place]. Topic: Quick Communication - See Telephone Encounter >> May 18, 2017 10:16 AM Ether Griffins B wrote: CRM for notification. See Telephone encounter for:  Pt would like results from her strep test. Results havent been released so [facility] nurse couldn't give those out. Pt states throat is still soar   05/18/17.

## 2017-05-21 NOTE — Telephone Encounter (Signed)
Patient informed. 

## 2017-06-01 ENCOUNTER — Other Ambulatory Visit: Payer: Self-pay | Admitting: Family Medicine

## 2017-06-01 DIAGNOSIS — I10 Essential (primary) hypertension: Secondary | ICD-10-CM

## 2017-06-04 ENCOUNTER — Ambulatory Visit: Payer: Medicare Other | Admitting: Physician Assistant

## 2017-06-04 ENCOUNTER — Encounter: Payer: Self-pay | Admitting: Physician Assistant

## 2017-06-04 ENCOUNTER — Other Ambulatory Visit: Payer: Self-pay

## 2017-06-04 VITALS — BP 158/88 | HR 87 | Temp 98.2°F | Resp 18 | Ht 65.43 in | Wt 134.4 lb

## 2017-06-04 DIAGNOSIS — R0981 Nasal congestion: Secondary | ICD-10-CM

## 2017-06-04 DIAGNOSIS — J329 Chronic sinusitis, unspecified: Secondary | ICD-10-CM | POA: Diagnosis not present

## 2017-06-04 DIAGNOSIS — J309 Allergic rhinitis, unspecified: Secondary | ICD-10-CM | POA: Diagnosis not present

## 2017-06-04 MED ORDER — AMOXICILLIN 500 MG PO CAPS: 500.0000 mg | ORAL_CAPSULE | Freq: Two times a day (BID) | ORAL | 0 refills | Status: AC

## 2017-06-04 NOTE — Patient Instructions (Addendum)
I believe that your symptoms are all related to your allergies.  I recommend you continue taking loratadine daily.  I suggest using Flonase for the next 3-4 days consistently.  As long as you tolerate it well and have no symptoms, use it after that as needed.  I also recommend you start using a coolmist humidifier in her room at nighttime.  The link for the humidifier on [place] is below.  [email or web address]   I also recommend you  start doing nasal saline rinses as tolerated.  I also suggest making the TM that was given to you at your last visit.  This will help with your sore throat.  If any of your symptoms worsen, or you develop new concerning symptoms like fever, chills, dark green sputum production, please contact our office or seek care.  Thank you for letting me participate in your health and well being.   Allergic Rhinitis Allergic rhinitis is when the mucous membranes in the nose respond to allergens. Allergens are particles in the air that cause your body to have an allergic reaction. This causes you to release allergic antibodies. Through a chain of events, these eventually cause you to release histamine into the blood stream. Although meant to protect the body, it is this release of histamine that causes your discomfort, such as frequent sneezing, congestion, and an itchy, runny nose. What are the causes? Seasonal allergic rhinitis (hay fever) is caused by pollen allergens that may come from grasses, trees, and weeds. Year-round allergic rhinitis (perennial allergic rhinitis) is caused by allergens such as house dust mites, pet dander, and mold spores. What are the signs or symptoms?  Nasal stuffiness (congestion).  Itchy, runny nose with sneezing and tearing of the eyes. How is this diagnosed? Your health care provider can help you  determine the allergen or allergens that trigger your symptoms. If you and your health care provider are unable to determine the allergen, skin or blood testing may be used. Your health care provider will diagnose your condition after taking your health history and performing a physical exam. Your health care provider may assess you for other related conditions, such as asthma, pink eye, or an ear infection. How is this treated? Allergic rhinitis does not have a cure, but it can be controlled by:  Medicines that block allergy symptoms. These may include allergy shots, nasal sprays, and oral antihistamines.  Avoiding the allergen.  Hay fever may often be treated with antihistamines in pill or nasal spray forms. Antihistamines block the effects of histamine. There are over-the-counter medicines that may help with nasal congestion and swelling around the eyes. Check with your health care provider before taking or giving this medicine. If avoiding the allergen or the medicine prescribed do not work, there are many new medicines your health care provider can prescribe. Stronger medicine may be used if initial measures are ineffective. Desensitizing injections can be used if medicine and avoidance does not work. Desensitization is when a patient is given ongoing shots until the body becomes less sensitive to the allergen. Make sure you follow up with your health care provider if problems continue. Follow these instructions at home: It is not possible to completely avoid allergens, but you can reduce your symptoms by taking steps to limit your exposure to them. It helps to know exactly what you are allergic to so that you can avoid your specific triggers. Contact a health care provider if:  You have a fever.  You develop a cough that does  not stop easily (persistent).  You have shortness of breath.  You start wheezing.  Symptoms interfere with normal daily activities. This information is not intended  to replace advice given to you by your health care provider. Make sure you discuss any questions you have with your health care provider. Document Released: 03/21/2001 Document Revised: 02/25/2016 Document Reviewed: 03/03/2013 Elsevier Interactive Patient Education  2017 [employer].

## 2017-06-04 NOTE — Progress Notes (Signed)
MRN: [id number] DOB: Nov 26, 1945  Subjective:   Ruth Walker is a 71 y.o. female presenting for chief complaint of Sinus Problem (X  weeks on and off) and Nasal Congestion .  Reports 4 day history of nasal congestion, post nasal drip, sneezing, and itchy watery eyes. Starting to have worsening sinus pressure. Notes her post nasal drainage is worse at night and first thing in the morning. Has tried loratadine with no full relief. Denies fever, ear pain, sore throat, wheezing, shortness of breath, myalgia and cough, night sweats, chills, nausea, vomiting, abdominal pain and diarrhea. Has had sick contact with grandchildren. Has extensive history of seasonal allergies. Used to use flonase for nasal congestion, which helps a lot but she stopped a while ago because she had one nose bleed. Has hx of sinus infections. Patient had had flu shot this season. Denies smoking. Denies any other aggravating or relieving factors, no other questions or concerns.  Skyley has a current medication list which includes the following prescription(s): alprazolam, aspirin, calcium carbonate, cyclosporine, estradiol, fluticasone, hydrochlorothiazide, loratadine, metoprolol tartrate, polyethylene glycol, PRESCRIPTION MEDICATION, align, sodium chloride, wheat dextrin, diltiazem, fluticasone, hydrocortisone, and triamcinolone cream. Also is allergic to aloe; benadryl [diphenhydramine hcl]; cephalexin; ciprofloxacin; mucinex [guaifenesin er]; shellfish allergy; sudafed [pseudoephedrine hcl]; and sulfonamide derivatives.  Dessire  has a past medical history of Allergy, Anal fissure, Anxiety states, Asthma, Cholelithiasis, Depression, Diverticulosis, Environmental allergies, Hemorrhoids, Irritable bowel syndrome (IBS), Osteoporosis, Skin cancer, Uterine polyp, and Vertebral compression fracture ([place]). Also  has a past surgical history that includes Cesarean section; Hemorrhoid surgery; Back surgery; Skin cancer excision;  Endometrial polypectomy; Hysteroscopy; Dilation and curettage of uterus; Mouth surgery; Fracture surgery; and IBS.   Objective:   Vitals: BP (!) 158/88 (BP Location: Left Arm, Patient Position: Sitting, Cuff Size: Normal)   Pulse 87   Temp 98.2 F (36.8 C) (Oral)   Resp 18   Ht 5' 5.43" (1.662 m)   Wt 134 lb 6.4 oz (61 kg)   SpO2 100%   BMI 22.07 kg/m   Physical Exam  Constitutional: She is oriented to person, place, and time. She appears well-developed and well-nourished.  HENT:  Head: Normocephalic and atraumatic.  Right Ear: Tympanic membrane, external ear and ear canal normal.  Left Ear: Tympanic membrane, external ear and ear canal normal.  Nose: Mucosal edema ( mild on right, moderate on left) present. Right sinus exhibits no maxillary sinus tenderness and no frontal sinus tenderness. Left sinus exhibits no maxillary sinus tenderness and no frontal sinus tenderness.  Mouth/Throat: Uvula is midline and mucous membranes are normal. Posterior oropharyngeal erythema (cobblestoning noted) present. No tonsillar exudate.  Eyes: Conjunctivae are normal.  Neck: Normal range of motion.  Cardiovascular: Normal rate, regular rhythm and normal heart sounds.  Pulmonary/Chest: Effort normal and breath sounds normal. She has no wheezes. She has no rhonchi. She has no rales.  Lymphadenopathy:       Head (right side): No submental, no submandibular, no tonsillar, no preauricular, no posterior auricular and no occipital adenopathy present.       Head (left side): No submental, no submandibular, no tonsillar, no preauricular, no posterior auricular and no occipital adenopathy present.    She has no cervical adenopathy.       Right: No supraclavicular adenopathy present.       Left: No supraclavicular adenopathy present.  Neurological: She is alert and oriented to person, place, and time.  Skin: Skin is warm and dry.  Psychiatric: She has a normal  mood and affect.  Vitals reviewed.   No  results found for this or any previous visit (from the past 24 hour(s)).  Assessment and Plan :  1. Nasal congestion 2. Allergic rhinitis, unspecified seasonality, unspecified trigger Pt is well appearing. Her symptoms are suggestive of allergies. She is afebrile.  Vital stable.  Recommended continuing oral antihistamine.  Encouraged to use Flonase for the next 3-4 days and if she tolerates it well, continue as needed.  Encouraged to get a cool mist humidifier for her room. Do nasal saline rinses as needed.   3. Sinusitis, unspecified chronicity, unspecified location Pt concerned for bacterial etiology. Educated pt that this is unlikely a bacterial infection at this time. Given Rx for pt to fill in 3 days if she has no improvement with conservative tx or if symptoms worsen.  - amoxicillin (AMOXIL) 500 MG capsule; Take 1 capsule (500 mg total) by mouth 2 (two) times daily for 5 days.  Dispense: 10 capsule; Refill: 0  Tenna Delaine, PA-C  [facility] 06/04/2017 6:50 PM

## 2017-06-14 ENCOUNTER — Telehealth: Payer: Self-pay | Admitting: Family Medicine

## 2017-06-14 NOTE — Telephone Encounter (Unsigned)
Copied from [place]. Topic: Quick Communication - See Telephone Encounter >> Jun 14, 2017 11:08 AM Aurelio Brash B wrote: CRM for notification. See Telephone encounter for:  Need to know over the counter vit d 3     25 mcg   1 a day is this sufficient  takes in morning with calcium  06/14/17.

## 2017-07-11 ENCOUNTER — Other Ambulatory Visit: Payer: Self-pay | Admitting: Family Medicine

## 2017-08-07 ENCOUNTER — Encounter: Payer: Self-pay | Admitting: Physician Assistant

## 2017-08-07 ENCOUNTER — Other Ambulatory Visit: Payer: Self-pay

## 2017-08-07 ENCOUNTER — Ambulatory Visit: Payer: Medicare Other | Admitting: Physician Assistant

## 2017-08-07 VITALS — BP 128/78 | HR 95 | Temp 98.1°F | Resp 16 | Ht 64.96 in | Wt 138.0 lb

## 2017-08-07 DIAGNOSIS — J029 Acute pharyngitis, unspecified: Secondary | ICD-10-CM | POA: Diagnosis not present

## 2017-08-07 DIAGNOSIS — R0982 Postnasal drip: Secondary | ICD-10-CM

## 2017-08-07 LAB — POCT RAPID STREP A (OFFICE): Rapid Strep A Screen: NEGATIVE

## 2017-08-07 NOTE — Patient Instructions (Addendum)
Post nasal drip is causing your sore throat.  Use nasal spray at night before bed.  Continue saline nasal rinses.  Get your allergy shot.  Hot shower in the morning to help clear your mucus.  Come back and see me in 7 days if you are not better.   Stay well hydrated. Get lost of rest. Wash your hands often.   -Foods that can help speed recovery: honey, garlic, chicken soup, elderberries, green tea.  -Supplements that can help speed recovery: vitamin C, zinc, elderberry extract, quercetin, ginseng, selenium -Supplement with prebiotics and probiotics:   Advil or ibuprofen for pain. Do not take Aspirin.  Drink enough water and fluids to keep your urine clear or pale yellow.  For sore throat: ? Gargle with 8 oz of salt water ( tsp of salt per 1 qt of water) as often as every 1-2 hours to soothe your throat.  Gargle liquid benadryl.  Cepacol throat lozenges (if you are not at risk for choking).  For sore throat try using a honey-based tea. Use 3 teaspoons of honey with juice squeezed from half lemon. Place shaved pieces of ginger into 1/2-1 cup of water and warm over stove top. Then mix the ingredients and repeat every 4 hours as needed.  Cough Syrup Recipe: Sweet Lemon & Honey Thyme  Ingredients a handful of fresh thyme sprigs   1 pint of water (2 cups)  1/2 cup honey (raw is best, but regular will do)  1/2 lemon chopped Instructions 1. Place the lemon in the pint jar and cover with the honey. The honey will macerate the lemons and draw out liquids which taste so delicious! 2. Meanwhile, toss the thyme leaves into a saucepan and cover them with the water. 3. Bring the water to a gentle simmer and reduce it to half, about a cup of tea. 4. When the tea is reduced and cooled a bit, strain the sprigs & leaves, add it into the pint jar and stir it well. 5. Give it a shake and use a spoonful as needed. 6. Store your homemade cough syrup in the refrigerator for about a month.  What causes  a cough? In adults, common causes of a cough include: ?An infection of the airways or lungs (such as the common cold) ?Postnasal drip - Postnasal drip is when mucus from the nose drips down or flows along the back of the throat. Postnasal drip can happen when people have: .A cold .Allergies .A sinus infection - The sinuses are hollow areas in the bones of the face that open into the nose. ?Lung conditions, like asthma and chronic obstructive pulmonary disease (COPD) - Both of these conditions can make it hard to breathe. COPD is usually caused by smoking. ?Acid reflux - Acid reflux is when the acid that is normally in your stomach backs up into your esophagus (the tube that carries food from your mouth to your stomach). ?A side effect from blood pressure medicines called "ACE inhibitors" ?Smoking cigarettes  Is there anything I can do on my own to get rid of my cough? Yes. To help get rid of your cough, you can: ?Use a humidifier in your bedroom ?Use an over-the-counter cough medicine, or suck on cough drops or hard candy ?Stop smoking, if you smoke ?If you have allergies, avoid the things you are allergic to (like pollen, dust, animals, or mold) If you have acid reflux, your doctor or nurse will tell you which lifestyle changes can help reduce symptoms.  IF you received an x-ray today, you will receive an invoice from [facility]. Please contact [facility] at [phone] with questions or concerns regarding your invoice.   IF you received labwork today, you will receive an invoice from [place]. Please contact [facility] at [phone] with questions or concerns regarding your invoice.   Our billing staff will not be able to assist you with questions regarding bills from these companies.  You will be contacted with the lab results as soon as they are available. The fastest way to get your results is to activate your My Chart account. Instructions are located on  the last page of this paperwork. If you have not heard from [place] regarding the results in 2 weeks, please contact this office.

## 2017-08-07 NOTE — Progress Notes (Signed)
Ruth Walker  MRN: [id number] DOB: [birth year]  PCP: Shawnee Knapp, MD  Subjective:  Pt is a 72 year old female PMH HTN, DVT, IBS, gall stones, osteoporosis, asthma anxiety who presents to clinic for sore throat x 3 days. C/o post nasal drip. Throat is worse in the morning. She coughs up phlegm in the morning.   She missed her allergy shot last week.   Denies fever, chills, nausea, difficulty swallowing or breathing. She is eating and drinking well.   Review of Systems  Constitutional: Negative for chills, diaphoresis, fatigue and fever.  HENT: Positive for postnasal drip and sore throat. Negative for congestion, rhinorrhea, sinus pressure and sinus pain.   Respiratory: Positive for cough. Negative for shortness of breath and wheezing.   Psychiatric/Behavioral: Negative for sleep disturbance.    Patient Active Problem List   Diagnosis Date Noted  . Perianal irritation 12/01/2016  . Allergic rhinitis 11/14/2016  . External hemorrhoids 03/04/2012  . DVT (deep venous thrombosis) ([place])   . Uterine polyp   . Osteoporosis   . Vertebral compression fracture ([place])   . IBS (irritable bowel syndrome) 09/05/2011  . Constipation, slow transit 09/05/2011  . Anxiety disorder 09/05/2011  . Diverticulosis 11/04/2010  . Gallstones 11/04/2010  . ANAL FISSURE 05/26/2008  . OBSESSIVE-COMPULSIVE PERSONALITY DISORDER 02/28/2008  . Essential hypertension 02/28/2008  . Hemorrhoids 02/28/2008  . ASTHMA 02/28/2008  . CYSTITIS, CHRONIC 02/28/2008    Current Outpatient Medications on File Prior to Visit  Medication Sig Dispense Refill  . ALPRAZolam (XANAX) 0.25 MG tablet TAKE 1 TABLET BY MOUTH TWICE DAILY AS NEEDED FOR ANXIETY 90 tablet 0  . aspirin 81 MG tablet Take 81 mg by mouth daily.      . Calcium Carbonate (CALCIUM 600 PO) Take 1 tablet by mouth 2 (two) times daily.    . cycloSPORINE (RESTASIS) 0.05 % ophthalmic emulsion 1 drop 2 (two) times daily.      Marland Kitchen diltiazem 2 % GEL Apply 1  application topically 2 (two) times daily. 30 g 0  . estradiol (ESTRACE) 0.1 MG/GM vaginal cream Place 0.35 Applicatorfuls vaginally 2 (two) times a week. 42.5 g 8  . fluticasone (FLONASE) 50 MCG/ACT nasal spray Place 2 sprays into both nostrils daily. 16 g 0  . fluticasone (FLONASE) 50 MCG/ACT nasal spray Place 2 sprays daily into both nostrils. 16 g 12  . hydrochlorothiazide (HYDRODIURIL) 25 MG tablet TAKE 1 TABLET(25 MG) BY MOUTH DAILY 90 tablet 0  . hydrocortisone (ANUSOL-HC) 25 MG suppository Use 1 suppository at bedtime. 10 suppository 2  . loratadine (CLARITIN) 10 MG tablet TAKE 1 TABLET(10 MG) BY MOUTH DAILY 30 tablet 11  . metoprolol tartrate (LOPRESSOR) 25 MG tablet TAKE 1 TABLET(25 MG) BY MOUTH TWICE DAILY 180 tablet 0  . polyethylene glycol (MIRALAX / GLYCOLAX) packet Take 17 g by mouth daily. Reported on 07/10/2015    . PRESCRIPTION MEDICATION Allergy injection once a week.[facility] [employer] [facility]    . Probiotic Product (ALIGN) 4 MG CAPS Take 1 capsule by mouth daily. 21 capsule 0  . sodium chloride (OCEAN) 0.65 % SOLN nasal spray Place 1 spray into both nostrils every 2 (two) hours. 1 Bottle 11  . triamcinolone cream (KENALOG) 0.1 % Apply 1 application topically 2 (two) times daily. 30 g 0  . Wheat Dextrin (BENEFIBER DRINK MIX PO) Take 10 mLs by mouth every morning. Take 2 teaspoons every morning      . [DISCONTINUED] Linaclotide (LINZESS) 145 MCG CAPS Take 1  capsule by mouth daily. 8 capsule 0   No current facility-administered medications on file prior to visit.     Allergies  Allergen Reactions  . Aloe   . Benadryl [Diphenhydramine Hcl] Other (See Comments)    Makes her feel "hyper"  . Cephalexin     REACTION: coarse rash  . Ciprofloxacin   . Mucinex [Guaifenesin Er] Other (See Comments)    Makes her feel worse  . Shellfish Allergy Hives  . Sudafed [Pseudoephedrine Hcl] Hives and Other (See Comments)    Itching and hives asthma  . Sulfonamide Derivatives       Objective:  BP 128/78   Pulse 95   Temp 98.1 F (36.7 C) (Oral)   Resp 16   Ht 5' 4.96" (1.65 m)   Wt 138 lb (62.6 kg)   SpO2 99%   BMI 22.99 kg/m   Physical Exam  Constitutional: She is oriented to person, place, and time and well-developed, well-nourished, and in no distress. No distress.  HENT:  Right Ear: Tympanic membrane normal.  Left Ear: Tympanic membrane normal.  Nose: Mucosal edema present. No rhinorrhea. Right sinus exhibits no maxillary sinus tenderness and no frontal sinus tenderness. Left sinus exhibits no maxillary sinus tenderness and no frontal sinus tenderness.  Mouth/Throat: Oropharynx is clear and moist and mucous membranes are normal.  Cardiovascular: Normal rate, regular rhythm and normal heart sounds.  Pulmonary/Chest: Effort normal and breath sounds normal. No respiratory distress. She has no wheezes. She has no rales.  Neurological: She is alert and oriented to person, place, and time. GCS score is 15.  Skin: Skin is warm and dry.  Psychiatric: Mood, memory, affect and judgment normal.  Vitals reviewed.  Results for orders placed or performed in visit on 08/07/17  POCT rapid strep A  Result Value Ref Range   Rapid Strep A Screen Negative Negative    Assessment and Plan :  1. Sore throat 2. Post-nasal drip - POCT rapid strep A - Culture, Group A Strep - Rapid strep is negative. Suspect sore throat 2/2 PND. Advised nasal spray and nasal rinses, esp HS. Encouraged pt to get allergy shot. RTC in 5-7 days if no improvement.   Mercer Pod, PA-C  [facility] 08/07/2017 11:52 AM

## 2017-08-09 ENCOUNTER — Encounter: Payer: Self-pay | Admitting: Physician Assistant

## 2017-08-09 LAB — CULTURE, GROUP A STREP: Strep A Culture: NEGATIVE

## 2017-08-09 NOTE — Progress Notes (Signed)
Please call pt and let her know she is negative for strep throat.  Thank you!

## 2017-08-28 ENCOUNTER — Other Ambulatory Visit: Payer: Self-pay | Admitting: Family Medicine

## 2017-08-28 DIAGNOSIS — I10 Essential (primary) hypertension: Secondary | ICD-10-CM

## 2017-09-11 ENCOUNTER — Telehealth: Payer: Self-pay | Admitting: Family Medicine

## 2017-09-11 NOTE — Telephone Encounter (Signed)
Copied from [place] [phone]. Topic: Quick Communication - See Telephone Encounter >> Sep 11, 2017  3:53 PM Lolita Rieger, RMA wrote: CRM for notification. See Telephone encounter for:   09/11/17.Debra from [facility] needs a BUN w/GFR ordered for pt during her CPE on 10/11/17 and results faxed to them @[phone] 

## 2017-09-14 NOTE — Telephone Encounter (Signed)
Will try to remember.

## 2017-09-14 NOTE — Telephone Encounter (Signed)
Please advise. Dgaddy, cma 

## 2017-09-20 ENCOUNTER — Ambulatory Visit: Payer: Medicare Other

## 2017-09-20 ENCOUNTER — Encounter: Payer: Medicare Other | Admitting: Family Medicine

## 2017-10-03 ENCOUNTER — Ambulatory Visit: Payer: Self-pay

## 2017-10-03 ENCOUNTER — Encounter: Payer: Self-pay | Admitting: Physician Assistant

## 2017-10-03 ENCOUNTER — Ambulatory Visit (INDEPENDENT_AMBULATORY_CARE_PROVIDER_SITE_OTHER): Payer: Medicare Other

## 2017-10-03 ENCOUNTER — Ambulatory Visit: Payer: Medicare Other | Admitting: Physician Assistant

## 2017-10-03 VITALS — BP 144/98 | HR 90 | Temp 99.1°F | Resp 17 | Ht 64.0 in | Wt 138.0 lb

## 2017-10-03 DIAGNOSIS — M545 Low back pain, unspecified: Secondary | ICD-10-CM

## 2017-10-03 DIAGNOSIS — W19XXXA Unspecified fall, initial encounter: Secondary | ICD-10-CM

## 2017-10-03 NOTE — Patient Instructions (Signed)
     IF you received an x-ray today, you will receive an invoice from [facility]. Please contact [facility] at [phone] with questions or concerns regarding your invoice.   IF you received labwork today, you will receive an invoice from [facility]. Please contact [facility] at [phone] with questions or concerns regarding your invoice.   Our billing staff will not be able to assist you with questions regarding bills from these companies.  You will be contacted with the lab results as soon as they are available. The fastest way to get your results is to activate your My Chart account. Instructions are located on the last page of this paperwork. If you have not heard from us regarding the results in 2 weeks, please contact this office.     

## 2017-10-03 NOTE — Progress Notes (Signed)
Ruth Walker  MRN: [id number] DOB: Feb 23, 1946  PCP: Shawnee Knapp, MD  Subjective:  Pt is a pleasant 72 year old female PMH HTN, DVT, IBS, osteoporosis who presents to clinic for low back pain s/p fall.   She was changing her underpants in the dark before bed 2 nights ago when her leg got caught in the fabric and she went down on her backside. She noticed a bruise today, which makes her nervous.  Declines difficulty walking, pain with ambulation, weakness, n/t, increasing pain.   Review of Systems  Musculoskeletal: Positive for back pain. Negative for arthralgias and gait problem.  Skin: Positive for color change.  Neurological: Negative for weakness and numbness.    Patient Active Problem List   Diagnosis Date Noted  . Perianal irritation 12/01/2016  . Allergic rhinitis 11/14/2016  . External hemorrhoids 03/04/2012  . DVT (deep venous thrombosis) ([place])   . Uterine polyp   . Osteoporosis   . Vertebral compression fracture ([place])   . IBS (irritable bowel syndrome) 09/05/2011  . Constipation, slow transit 09/05/2011  . Anxiety disorder 09/05/2011  . Diverticulosis 11/04/2010  . Gallstones 11/04/2010  . ANAL FISSURE 05/26/2008  . OBSESSIVE-COMPULSIVE PERSONALITY DISORDER 02/28/2008  . Essential hypertension 02/28/2008  . Hemorrhoids 02/28/2008  . ASTHMA 02/28/2008  . CYSTITIS, CHRONIC 02/28/2008    Current Outpatient Medications on File Prior to Visit  Medication Sig Dispense Refill  . ALPRAZolam (XANAX) 0.25 MG tablet TAKE 1 TABLET BY MOUTH TWICE DAILY AS NEEDED FOR ANXIETY 90 tablet 0  . aspirin 81 MG tablet Take 81 mg by mouth daily.      . Calcium Carbonate (CALCIUM 600 PO) Take 1 tablet by mouth 2 (two) times daily.    . cycloSPORINE (RESTASIS) 0.05 % ophthalmic emulsion 1 drop 2 (two) times daily.      Marland Kitchen estradiol (ESTRACE) 0.1 MG/GM vaginal cream Place 9.93 Applicatorfuls vaginally 2 (two) times a week. 42.5 g 8  . fluticasone (FLONASE) 50 MCG/ACT nasal spray Place  2 sprays into both nostrils daily. 16 g 0  . hydrochlorothiazide (HYDRODIURIL) 25 MG tablet TAKE 1 TABLET(25 MG) BY MOUTH DAILY 90 tablet 0  . loratadine (CLARITIN) 10 MG tablet TAKE 1 TABLET(10 MG) BY MOUTH DAILY 30 tablet 11  . metoprolol tartrate (LOPRESSOR) 25 MG tablet TAKE 1 TABLET(25 MG) BY MOUTH TWICE DAILY 180 tablet 0  . polyethylene glycol (MIRALAX / GLYCOLAX) packet Take 17 g by mouth daily. Reported on 07/10/2015    . PRESCRIPTION MEDICATION Allergy injection once a week.[facility] [employer]    . Probiotic Product (ALIGN) 4 MG CAPS Take 1 capsule by mouth daily. 21 capsule 0  . Wheat Dextrin (BENEFIBER DRINK MIX PO) Take 10 mLs by mouth every morning. Take 2 teaspoons every morning      . [DISCONTINUED] Linaclotide (LINZESS) 145 MCG CAPS Take 1 capsule by mouth daily. 8 capsule 0   No current facility-administered medications on file prior to visit.     Allergies  Allergen Reactions  . Aloe   . Benadryl [Diphenhydramine Hcl] Other (See Comments)    Makes her feel "hyper"  . Cephalexin     REACTION: coarse rash  . Ciprofloxacin   . Mucinex [Guaifenesin Er] Other (See Comments)    Makes her feel worse  . Shellfish Allergy Hives  . Sudafed [Pseudoephedrine Hcl] Hives and Other (See Comments)    Itching and hives asthma  . Sulfonamide Derivatives      Objective:  BP Marland Kitchen)  189/77   Pulse 90   Temp 99.1 F (37.3 C) (Oral)   Resp 17   Ht 5\' 4"  (1.626 m)   Wt 138 lb (62.6 kg)   SpO2 98%   BMI 23.69 kg/m   Physical Exam  Constitutional: She is oriented to person, place, and time and well-developed, well-nourished, and in no distress. No distress.  Musculoskeletal:       Lumbar back: She exhibits tenderness.       Back:  Neurological: She is alert and oriented to person, place, and time. GCS score is 15.  Skin: Skin is warm and dry. Bruising noted.     Psychiatric: Mood, memory, affect and judgment normal.  Vitals reviewed.  Dg Lumbar Spine  Complete  Result Date: 10/03/2017 CLINICAL DATA:  Fall x2 days EXAM: LUMBAR SPINE - COMPLETE 4+ VIEW COMPARISON:  01/14/2013 FINDINGS: Normal lumbar lordosis.  Mild lumbar dextroscoliosis. No evidence of fracture or dislocation. Vertebral body heights are maintained. Mild multilevel degenerative changes. Visualized bony pelvis appears intact. IMPRESSION: No fracture or dislocation is seen. Mild degenerative changes. Electronically Signed   By: Julian Hy M.D.   On: 10/03/2017 18:08   Dg Hips Bilat W Or W/o Pelvis 2v  Result Date: 10/03/2017 CLINICAL DATA:  Fall x2 days, bilateral hip pain EXAM: DG HIP (WITH OR WITHOUT PELVIS) 2V BILAT COMPARISON:  None. FINDINGS: No fracture or dislocation is seen. Bilateral hip joint spaces are preserved. Visualized bony pelvis appears intact. Degenerative changes of the lower lumbar spine. IMPRESSION: Negative. Electronically Signed   By: Julian Hy M.D.   On: 10/03/2017 18:08    Assessment and Plan :  1. Fall, initial encounter 2. Acute right-sided low back pain without sciatica - DG Lumbar Spine Complete; Future - DG HIPS BILAT W OR W/O PELVIS 2V; Future - pt presents with low back pain and bruising s/p fall 2 days ago. Her lumbar and hip x-rays are negative. Tylenol and ice/heat for pain. F/u with Dr. Brigitte Pulse next week.   Mercer Pod, PA-C  [facility] 10/03/2017 5:23 PM

## 2017-10-03 NOTE — Telephone Encounter (Addendum)
Pt calling to report a 4 inch bruise located to the lower back. Injury occurred Monday. Pt was putting on her underwear and foot got caught in a leg hole. She fell down and hit her back against the closet. Pt stated she did not notice the brusing until today. Husband stated that it is black and noted some yellow color to the bruise. Pt stated that she has a h/o blood clot and takes Ecotrin every day. Pt denies pain. Pt asking for appt due to her concern of blood clot. Appt made for today with Whitney McVey PA @ 1700.  Reason for Disposition . Taking Coumadin (warfarin) or other strong blood thinner, or known bleeding disorder (e.g., thrombocytopenia)    Pt on OTC Ecotrin and has a h/o blood clot- pt asking for appt today  Answer Assessment - Initial Assessment Questions 1. APPEARANCE of BRUISE: "Describe the bruise."      Black starting to yellow 2. SIZE: "How large is the bruise?"    4 inches around  3. NUMBER: "How many bruises are there?"      1 4. LOCATION: "Where is the bruise located?"     Black bruises small of the back to the right 5. ONSET: "How long ago did the bruise occur?"  Monday night 6. CAUSE: "Tell me how it happened."     Was trying to put on underwear and got foot caught in one of the leg holes 7. MEDICAL HISTORY: "Do you have any medical problems that can cause easy bruising or bleeding?" (e.g., leukemia, liver disease, recent chemotherapy)     H/o blood clot 8. MEDICATIONS : "Do you take any medications which thin the blood such as: aspirin, heparin, ibuprofen (NSAIDS), Plavix, or Coumadin?"     Ecotrin 81 mg 9. OTHER SYMPTOMS: "Do you have any other symptoms?"  (e.g., weakness, dizziness, pain, fever, nosebleed, blood in urine/stool)     no 10. PREGNANCY: "Is there any chance you are pregnant?" "When was your last menstrual period?"       n/a  Protocols used: BRUISES-A-AH

## 2017-10-06 ENCOUNTER — Other Ambulatory Visit: Payer: Self-pay | Admitting: Family Medicine

## 2017-10-09 ENCOUNTER — Ambulatory Visit (INDEPENDENT_AMBULATORY_CARE_PROVIDER_SITE_OTHER): Payer: Medicare Other

## 2017-10-09 ENCOUNTER — Ambulatory Visit: Payer: Medicare Other

## 2017-10-09 VITALS — BP 130/78 | HR 71 | Ht 64.0 in | Wt 137.0 lb

## 2017-10-09 DIAGNOSIS — Z0000 Encounter for general adult medical examination without abnormal findings: Secondary | ICD-10-CM

## 2017-10-09 NOTE — Progress Notes (Signed)
Subjective:   Ruth Walker is a 72 y.o. female who presents for Medicare Annual (Subsequent) preventive examination.  Review of Systems:  N/A Cardiac Risk Factors include: advanced age (>1men, >12 women);hypertension     Objective:     Vitals: BP 130/78   Pulse 71   Ht 5\' 4"  (1.626 m)   Wt 137 lb (62.1 kg)   SpO2 98%   BMI 23.52 kg/m   Body mass index is 23.52 kg/m.  Advanced Directives 10/09/2017 06/04/2017 06/22/2016  Does Patient Have a Medical Advance Directive? No No Yes  Type of Advance Directive - Public librarian;Living will  Does patient want to make changes to medical advance directive? - - Yes (MAU/Ambulatory/Procedural Areas - Information given)  Copy of [place] in Chart? - - No - copy requested  Would patient like information on creating a medical advance directive? Yes (MAU/Ambulatory/Procedural Areas - Information given) No - Patient declined -    Tobacco Social History   Tobacco Use  Smoking Status Never Smoker  Smokeless Tobacco Never Used     Counseling given: Not Answered   Clinical Intake:  Pre-visit preparation completed: Yes  Pain : No/denies pain     Nutritional Status: BMI of 19-24  Normal Nutritional Risks: None Diabetes: No  How often do you need to have someone help you when you read instructions, pamphlets, or other written materials from your doctor or pharmacy?: 1 - Never What is the last grade level you completed in school?: Some college  Interpreter Needed?: No  Information entered by :: Andrez Grime, LPN  Past Medical History:  Diagnosis Date  . Allergy   . Anal fissure   . Anxiety states   . Asthma    Gets allergy shots once a week   . Cholelithiasis   . Depression   . Diverticulosis   . Environmental allergies   . Hemorrhoids   . Irritable bowel syndrome (IBS)   . Osteoporosis   . Skin cancer    Leg  . Uterine polyp   . Vertebral compression fracture [facility])     Past Surgical History:  Procedure Laterality Date  . BACK SURGERY    . CESAREAN SECTION    . DILATION AND CURETTAGE OF UTERUS    . Endometrial polypectomy    . FRACTURE SURGERY     neck (vertebrae)  . HEMORRHOID SURGERY    . HYSTEROSCOPY    . IBS    . MOUTH SURGERY    . SKIN CANCER EXCISION     Leg    Family History  Problem Relation Age of Onset  . Diabetes Mother   . Hypertension Mother   . Thyroid disease Mother   . Hypertension Father   . Heart disease Father   . Thyroid disease Sister   . Colon cancer Neg Hx    Social History   Socioeconomic History  . Marital status: Married    Spouse name: Not on file  . Number of children: 2  . Years of education: some college  . Highest education level: Not on file  Occupational History  . Occupation: Retired  Scientific laboratory technician  . Financial resource strain: Not hard at all  . Food insecurity:    Worry: Never true    Inability: Never true  . Transportation needs:    Medical: No    Non-medical: No  Tobacco Use  . Smoking status: Never Smoker  . Smokeless tobacco: Never Used  Substance and Sexual Activity  . Alcohol use: No    Alcohol/week: 0.0 oz  . Drug use: No  . Sexual activity: Yes    Birth control/protection: Post-menopausal    Comment: number of sex partners in the last 12 months  1  Lifestyle  . Physical activity:    Days per week: 7 days    Minutes per session: 20 min  . Stress: Rather much  Relationships  . Social connections:    Talks on phone: More than three times a week    Gets together: More than three times a week    Attends religious service: Patient refused    Active member of club or organization: No    Attends meetings of clubs or organizations: Never    Relationship status: Married  Other Topics Concern  . Not on file  Social History Narrative   Patient gets regular exercise. Cares for her grandchildren.    Outpatient Encounter Medications as of 10/09/2017  Medication Sig  . ALPRAZolam  (XANAX) 0.25 MG tablet TAKE 1 TABLET BY MOUTH TWICE DAILY AS NEEDED FOR ANXIETY  . aspirin 81 MG tablet Take 81 mg by mouth daily.    . Calcium Carbonate (CALCIUM 600 PO) Take 1 tablet by mouth 2 (two) times daily.  . cycloSPORINE (RESTASIS) 0.05 % ophthalmic emulsion 1 drop 2 (two) times daily.    Marland Kitchen estradiol (ESTRACE) 0.1 MG/GM vaginal cream Place 1.60 Applicatorfuls vaginally 2 (two) times a week.  . fluticasone (FLONASE) 50 MCG/ACT nasal spray Place 2 sprays into both nostrils daily.  . hydrochlorothiazide (HYDRODIURIL) 25 MG tablet TAKE 1 TABLET(25 MG) BY MOUTH DAILY  . loratadine (CLARITIN) 10 MG tablet TAKE 1 TABLET(10 MG) BY MOUTH DAILY  . metoprolol tartrate (LOPRESSOR) 25 MG tablet TAKE 1 TABLET(25 MG) BY MOUTH TWICE DAILY  . polyethylene glycol (MIRALAX / GLYCOLAX) packet Take 17 g by mouth daily. Reported on 07/10/2015  . PRESCRIPTION MEDICATION Allergy injection once a week.[facility] [employer] [employer] [facility]  . Probiotic Product (ALIGN) 4 MG CAPS Take 1 capsule by mouth daily.  . Wheat Dextrin (BENEFIBER DRINK MIX PO) Take 10 mLs by mouth every morning. Take 2 teaspoons every morning    . [DISCONTINUED] Linaclotide (LINZESS) 145 MCG CAPS Take 1 capsule by mouth daily.   No facility-administered encounter medications on file as of 10/09/2017.     Activities of Daily Living In your present state of health, do you have any difficulty performing the following activities: 10/09/2017 06/04/2017  Hearing? N N  Vision? N N  Difficulty concentrating or making decisions? Y N  Comment Patient forgets small things sometimes -  Walking or climbing stairs? N N  Dressing or bathing? N N  Doing errands, shopping? N N  Preparing Food and eating ? N -  Using the Toilet? N -  In the past six months, have you accidently leaked urine? Y -  Comment Patient has some urine leakage -  Do you have problems with loss of bowel control? N -  Managing your Medications? N -  Managing your Finances? N -    Housekeeping or managing your Housekeeping? N -  Some recent data might be hidden    Patient Care Team: Shawnee Knapp, MD as PCP - General (Family Medicine)    Assessment:   This is a routine wellness examination for Ruth Walker.  Exercise Activities and Dietary recommendations Current Exercise Habits: Home exercise routine, Type of exercise: strength training/weights, Time (Minutes): 20, Frequency (Times/Week): 7, Weekly  Exercise (Minutes/Week): 140, Intensity: Mild, Exercise limited by: None identified  Goals    . improve stress     Patient states that she wants to try to improve the way she deals with stress       Fall Risk Fall Risk  10/09/2017 10/03/2017 08/07/2017 06/04/2017 05/15/2017  Falls in the past year? Yes Yes No No No  Number falls in past yr: 1 1 - - -  Injury with Fall? No Yes - - -  Risk for fall due to : (No Data) - - - -  Risk for fall due to: Comment tripped and fell  - - - -  Follow up Falls prevention discussed - - - -   Is the patient's home free of loose throw rugs in walkways, pet beds, electrical cords, etc?   yes      Grab bars in the bathroom? yes      Handrails on the stairs?   yes      Adequate lighting?   yes  Timed Get Up and Go performed: yes, completed within 30 seconds   Depression Screen PHQ 2/9 Scores 10/09/2017 10/03/2017 08/07/2017 06/04/2017  PHQ - 2 Score 0 0 0 2  PHQ- 9 Score - - - 4     Cognitive Function     6CIT Screen 10/09/2017  What Year? 0 points  What month? 0 points  What time? 0 points  Count back from 20 0 points  Months in reverse 0 points  Repeat phrase 0 points  Total Score 0    Immunization History  Administered Date(s) Administered  . Influenza Split 05/04/2012  . Influenza,inj,Quad PF,6+ Mos 03/29/2013, 04/19/2014, 05/15/2017  . Influenza-Unspecified 04/26/2015, 05/26/2016  . Pneumococcal Conjugate-13 06/22/2016  . Tdap 06/18/2013    Qualifies for Shingles Vaccine?Discussed Shingrix  Screening Tests Health  Maintenance  Topic Date Due  . PAP SMEAR  12/12/2012  . MAMMOGRAM  10/10/2018 (Originally 07/31/2015)  . PNA vac Low Risk Adult (2 of 2 - PPSV23) 10/10/2018 (Originally 06/22/2017)  . INFLUENZA VACCINE  02/07/2018  . COLONOSCOPY  07/11/2019  . TETANUS/TDAP  06/19/2023  . DEXA SCAN  Completed  . Hepatitis C Screening  Completed    Cancer Screenings: Lung: Low Dose CT Chest recommended if Age 39-80 years, 30 pack-year currently smoking OR have quit w/in 15years. Patient does not qualify. Breast:  Up to date on Mammogram? No, patient declined, states she will have this completed at a later date   Up to date of Bone Density/Dexa? No, Patient declined Colorectal: colonoscopy completed 07/10/2009  Additional Screenings: : Hepatitis C Screening: completed 04/28/2013 Patient declined Pneumovax 23 today    Plan:   I have personally reviewed and noted the following in the patient's chart:   . Medical and social history . Use of alcohol, tobacco or illicit drugs  . Current medications and supplements . Functional ability and status . Nutritional status . Physical activity . Advanced directives . List of other physicians . Hospitalizations, surgeries, and ER visits in previous 12 months . Vitals . Screenings to include cognitive, depression, and falls . Referrals and appointments  In addition, I have reviewed and discussed with patient certain preventive protocols, quality metrics, and best practice recommendations. A written personalized care plan for preventive services as well as general preventive health recommendations were provided to patient.   1. Encounter for Medicare annual wellness exam   Andrez Grime, LPN  08/16/3783

## 2017-10-09 NOTE — Patient Instructions (Addendum)
Ruth Walker , Thank you for taking time to come for your Medicare Wellness Visit. I appreciate your ongoing commitment to your health goals. Please review the following plan we discussed and let me know if I can assist you in the future.   Screening recommendations/referrals: Colonoscopy: up to date, next due 07/11/2019 Mammogram: declined  Bone Density: declined Recommended yearly ophthalmology/optometry visit for glaucoma screening and checkup Recommended yearly dental visit for hygiene and checkup  Vaccinations: Influenza vaccine: up to date  Pneumococcal vaccine: declined today  Tdap vaccine: up to date, next due 06/19/2023 Shingles vaccine: Discussed Shingrix    Advanced directives: Advance directive discussed with you today. I have provided a copy for you to complete at home and have notarized. Once this is complete please bring a copy in to our office so we can scan it into your chart.   Conditions/risks identified: Try to improve the way you deal with stress with more positive reinforcements   Next appointment: 10/11/17 @ 11 am with Dr. Brigitte Pulse, Next AWV in 1 year    Preventive Care 71 Years and Older, Female Preventive care refers to lifestyle choices and visits with your health care provider that can promote health and wellness. What does preventive care include?  A yearly physical exam. This is also called an annual well check.  Dental exams once or twice a year.  Routine eye exams. Ask your health care provider how often you should have your eyes checked.  Personal lifestyle choices, including:  Daily care of your teeth and gums.  Regular physical activity.  Eating a healthy diet.  Avoiding tobacco and drug use.  Limiting alcohol use.  Practicing safe sex.  Taking low-dose aspirin every day.  Taking vitamin and mineral supplements as recommended by your health care provider. What happens during an annual well check? The services and screenings done by your  health care provider during your annual well check will depend on your age, overall health, lifestyle risk factors, and family history of disease. Counseling  Your health care provider may ask you questions about your:  Alcohol use.  Tobacco use.  Drug use.  Emotional well-being.  Home and relationship well-being.  Sexual activity.  Eating habits.  History of falls.  Memory and ability to understand (cognition).  Work and work Statistician.  Reproductive health. Screening  You may have the following tests or measurements:  Height, weight, and BMI.  Blood pressure.  Lipid and cholesterol levels. These may be checked every 5 years, or more frequently if you are over 49 years old.  Skin check.  Lung cancer screening. You may have this screening every year starting at age 38 if you have a 30-pack-year history of smoking and currently smoke or have quit within the past 15 years.  Fecal occult blood test (FOBT) of the stool. You may have this test every year starting at age 108.  Flexible sigmoidoscopy or colonoscopy. You may have a sigmoidoscopy every 5 years or a colonoscopy every 10 years starting at age 49.  Hepatitis C blood test.  Hepatitis B blood test.  Sexually transmitted disease (STD) testing.  Diabetes screening. This is done by checking your blood sugar (glucose) after you have not eaten for a while (fasting). You may have this done every 1-3 years.  Bone density scan. This is done to screen for osteoporosis. You may have this done starting at age 72.  Mammogram. This may be done every 1-2 years. Talk to your health care provider about  how often you should have regular mammograms. Talk with your health care provider about your test results, treatment options, and if necessary, the need for more tests. Vaccines  Your health care provider may recommend certain vaccines, such as:  Influenza vaccine. This is recommended every year.  Tetanus, diphtheria, and  acellular pertussis (Tdap, Td) vaccine. You may need a Td booster every 10 years.  Zoster vaccine. You may need this after age 16.  Pneumococcal 13-valent conjugate (PCV13) vaccine. One dose is recommended after age 63.  Pneumococcal polysaccharide (PPSV23) vaccine. One dose is recommended after age 53. Talk to your health care provider about which screenings and vaccines you need and how often you need them. This information is not intended to replace advice given to you by your health care provider. Make sure you discuss any questions you have with your health care provider. Document Released: 07/23/2015 Document Revised: 03/15/2016 Document Reviewed: 04/27/2015 Elsevier Interactive Patient Education  2017 [place] Prevention in the Home Falls can cause injuries. They can happen to people of all ages. There are many things you can do to make your home safe and to help prevent falls. What can I do on the outside of my home?  Regularly fix the edges of walkways and driveways and fix any cracks.  Remove anything that might make you trip as you walk through a door, such as a raised step or threshold.  Trim any bushes or trees on the path to your home.  Use bright outdoor lighting.  Clear any walking paths of anything that might make someone trip, such as rocks or tools.  Regularly check to see if handrails are loose or broken. Make sure that both sides of any steps have handrails.  Any raised decks and porches should have guardrails on the edges.  Have any leaves, snow, or ice cleared regularly.  Use sand or salt on walking paths during winter.  Clean up any spills in your garage right away. This includes oil or grease spills. What can I do in the bathroom?  Use night lights.  Install grab bars by the toilet and in the tub and shower. Do not use towel bars as grab bars.  Use non-skid mats or decals in the tub or shower.  If you need to sit down in the shower, use  a plastic, non-slip stool.  Keep the floor dry. Clean up any water that spills on the floor as soon as it happens.  Remove soap buildup in the tub or shower regularly.  Attach bath mats securely with double-sided non-slip rug tape.  Do not have throw rugs and other things on the floor that can make you trip. What can I do in the bedroom?  Use night lights.  Make sure that you have a light by your bed that is easy to reach.  Do not use any sheets or blankets that are too big for your bed. They should not hang down onto the floor.  Have a firm chair that has side arms. You can use this for support while you get dressed.  Do not have throw rugs and other things on the floor that can make you trip. What can I do in the kitchen?  Clean up any spills right away.  Avoid walking on wet floors.  Keep items that you use a lot in easy-to-reach places.  If you need to reach something above you, use a strong step stool that has a grab bar.  Keep  electrical cords out of the way.  Do not use floor polish or wax that makes floors slippery. If you must use wax, use non-skid floor wax.  Do not have throw rugs and other things on the floor that can make you trip. What can I do with my stairs?  Do not leave any items on the stairs.  Make sure that there are handrails on both sides of the stairs and use them. Fix handrails that are broken or loose. Make sure that handrails are as long as the stairways.  Check any carpeting to make sure that it is firmly attached to the stairs. Fix any carpet that is loose or worn.  Avoid having throw rugs at the top or bottom of the stairs. If you do have throw rugs, attach them to the floor with carpet tape.  Make sure that you have a light switch at the top of the stairs and the bottom of the stairs. If you do not have them, ask someone to add them for you. What else can I do to help prevent falls?  Wear shoes that:  Do not have high heels.  Have  rubber bottoms.  Are comfortable and fit you well.  Are closed at the toe. Do not wear sandals.  If you use a stepladder:  Make sure that it is fully opened. Do not climb a closed stepladder.  Make sure that both sides of the stepladder are locked into place.  Ask someone to hold it for you, if possible.  Clearly mark and make sure that you can see:  Any grab bars or handrails.  First and last steps.  Where the edge of each step is.  Use tools that help you move around (mobility aids) if they are needed. These include:  Canes.  Walkers.  Scooters.  Crutches.  Turn on the lights when you go into a dark area. Replace any light bulbs as soon as they burn out.  Set up your furniture so you have a clear path. Avoid moving your furniture around.  If any of your floors are uneven, fix them.  If there are any pets around you, be aware of where they are.  Review your medicines with your doctor. Some medicines can make you feel dizzy. This can increase your chance of falling. Ask your doctor what other things that you can do to help prevent falls. This information is not intended to replace advice given to you by your health care provider. Make sure you discuss any questions you have with your health care provider. Document Released: 04/22/2009 Document Revised: 12/02/2015 Document Reviewed: 07/31/2014 Elsevier Interactive Patient Education  2017 [employer].

## 2017-10-11 ENCOUNTER — Ambulatory Visit (INDEPENDENT_AMBULATORY_CARE_PROVIDER_SITE_OTHER): Payer: Medicare Other | Admitting: Family Medicine

## 2017-10-11 ENCOUNTER — Other Ambulatory Visit: Payer: Self-pay

## 2017-10-11 ENCOUNTER — Encounter: Payer: Self-pay | Admitting: Family Medicine

## 2017-10-11 ENCOUNTER — Other Ambulatory Visit: Payer: Self-pay | Admitting: Family Medicine

## 2017-10-11 VITALS — BP 140/84 | HR 95 | Temp 97.7°F | Resp 18 | Ht 64.0 in | Wt 136.4 lb

## 2017-10-11 DIAGNOSIS — R7303 Prediabetes: Secondary | ICD-10-CM | POA: Diagnosis not present

## 2017-10-11 DIAGNOSIS — E782 Mixed hyperlipidemia: Secondary | ICD-10-CM

## 2017-10-11 DIAGNOSIS — Z130 Encounter for screening for diseases of the blood and blood-forming organs and certain disorders involving the immune mechanism: Secondary | ICD-10-CM | POA: Diagnosis not present

## 2017-10-11 DIAGNOSIS — Z5181 Encounter for therapeutic drug level monitoring: Secondary | ICD-10-CM

## 2017-10-11 DIAGNOSIS — Z20818 Contact with and (suspected) exposure to other bacterial communicable diseases: Secondary | ICD-10-CM

## 2017-10-11 DIAGNOSIS — I10 Essential (primary) hypertension: Secondary | ICD-10-CM

## 2017-10-11 DIAGNOSIS — K581 Irritable bowel syndrome with constipation: Secondary | ICD-10-CM | POA: Diagnosis not present

## 2017-10-11 DIAGNOSIS — M810 Age-related osteoporosis without current pathological fracture: Secondary | ICD-10-CM

## 2017-10-11 DIAGNOSIS — Z0000 Encounter for general adult medical examination without abnormal findings: Secondary | ICD-10-CM

## 2017-10-11 DIAGNOSIS — Z136 Encounter for screening for cardiovascular disorders: Secondary | ICD-10-CM | POA: Diagnosis not present

## 2017-10-11 DIAGNOSIS — J4520 Mild intermittent asthma, uncomplicated: Secondary | ICD-10-CM

## 2017-10-11 DIAGNOSIS — Z1389 Encounter for screening for other disorder: Secondary | ICD-10-CM

## 2017-10-11 DIAGNOSIS — Z1383 Encounter for screening for respiratory disorder NEC: Secondary | ICD-10-CM

## 2017-10-11 LAB — POCT URINALYSIS DIP (MANUAL ENTRY)
BILIRUBIN UA: NEGATIVE
BILIRUBIN UA: NEGATIVE mg/dL
Glucose, UA: NEGATIVE mg/dL
NITRITE UA: NEGATIVE
PH UA: 6 (ref 5.0–8.0)
Protein Ur, POC: NEGATIVE mg/dL
SPEC GRAV UA: 1.025 (ref 1.010–1.025)
Urobilinogen, UA: 0.2 E.U./dL

## 2017-10-11 NOTE — Patient Instructions (Signed)
     IF you received an x-ray today, you will receive an invoice from [facility]. Please contact [facility] at [phone] with questions or concerns regarding your invoice.   IF you received labwork today, you will receive an invoice from [facility]. Please contact [facility] at [phone] with questions or concerns regarding your invoice.   Our billing staff will not be able to assist you with questions regarding bills from these companies.  You will be contacted with the lab results as soon as they are available. The fastest way to get your results is to activate your My Chart account. Instructions are located on the last page of this paperwork. If you have not heard from us regarding the results in 2 weeks, please contact this office.     

## 2017-10-11 NOTE — Telephone Encounter (Signed)
Pharmacy called and spoke to [place], [facility] who verified receipt of metoprolol, but will need the refill for hydrochlorothiazide.

## 2017-10-11 NOTE — Progress Notes (Signed)
Subjective:  By signing my name below, I, [name], attest that this documentation has been prepared under the direction and in the presence of [name], MD Electronically Signed: Ladene Artist, ED Scribe 10/11/2017 at 11:35 AM.   Patient ID: Ruth Walker, female    DOB: 1945-08-31, 72 y.o.   MRN: [id number]  Chief Complaint  Patient presents with  . Annual Exam    No pap smear  . Medication Refill    Pt requests all meds to be refilled.   HPI Ruth Walker is a 72 y.o. female who presents to Primary Care at [facility] for an annual exam. Pt is fasting at this visit.  Primary Preventative Screenings: Cervical Cancer: no longer seeing GYN STI screening: Breast Cancer: plans to schedule a mammogram Colorectal Cancer: Tobacco use/EtOH/substances: Bone Density: Cardiac: Weight/Blood sugar/Diet/Exercise: reports recent poor eating habits as she has been eating her feelings lately and eating a lot of chocolate BMI Readings from Last 3 Encounters:  10/09/17 23.52 kg/m  10/03/17 23.69 kg/m  08/07/17 22.99 kg/m   Lab Results  Component Value Date   HGBA1C 5.8 (H) 06/19/2016   OTC/Vit/Supp/Herbal: Dentist/Optho: Immunizations:  Immunization History  Administered Date(s) Administered  . Influenza Split 05/04/2012  . Influenza,inj,Quad PF,6+ Mos 03/29/2013, 04/19/2014, 05/15/2017  . Influenza-Unspecified 04/26/2015, 05/26/2016  . Pneumococcal Conjugate-13 06/22/2016  . Tdap 06/18/2013   Chronic Medical Conditions: Recurrent Cystitis Pt states that her last UTI was 7 yrs ago prior to hematuria and dysuria in Dec. Previous urologist Dr. Gaynelle Arabian retired. Has an upcoming CT scan on 4/12 ordered by Dr. Matilde Sprang for recurrent cystitis. Pt would like blood work faxed to [facility] at [phone].  Past Medical History:  Diagnosis Date  . Allergy   . Anal fissure   . Anxiety states   . Asthma    Gets allergy shots once a week   . Cholelithiasis   . Depression   .  Diverticulosis   . Environmental allergies   . Hemorrhoids   . Irritable bowel syndrome (IBS)   . Osteoporosis   . Skin cancer    Leg  . Uterine polyp   . Vertebral compression fracture [facility])    Past Surgical History:  Procedure Laterality Date  . BACK SURGERY    . CESAREAN SECTION    . DILATION AND CURETTAGE OF UTERUS    . Endometrial polypectomy    . FRACTURE SURGERY     neck (vertebrae)  . HEMORRHOID SURGERY    . HYSTEROSCOPY    . IBS    . MOUTH SURGERY    . SKIN CANCER EXCISION     Leg    Current Outpatient Medications on File Prior to Visit  Medication Sig Dispense Refill  . ALPRAZolam (XANAX) 0.25 MG tablet TAKE 1 TABLET BY MOUTH TWICE DAILY AS NEEDED FOR ANXIETY 90 tablet 0  . aspirin 81 MG tablet Take 81 mg by mouth daily.      . Calcium Carbonate (CALCIUM 600 PO) Take 1 tablet by mouth 2 (two) times daily.    . cycloSPORINE (RESTASIS) 0.05 % ophthalmic emulsion 1 drop 2 (two) times daily.      Marland Kitchen estradiol (ESTRACE) 0.1 MG/GM vaginal cream Place 3.66 Applicatorfuls vaginally 2 (two) times a week. 42.5 g 8  . fluticasone (FLONASE) 50 MCG/ACT nasal spray Place 2 sprays into both nostrils daily. 16 g 0  . hydrochlorothiazide (HYDRODIURIL) 25 MG tablet TAKE 1 TABLET(25 MG) BY MOUTH DAILY 90 tablet 0  . loratadine (  CLARITIN) 10 MG tablet TAKE 1 TABLET(10 MG) BY MOUTH DAILY 30 tablet 11  . metoprolol tartrate (LOPRESSOR) 25 MG tablet TAKE 1 TABLET(25 MG) BY MOUTH TWICE DAILY 60 tablet 0  . polyethylene glycol (MIRALAX / GLYCOLAX) packet Take 17 g by mouth daily. Reported on 07/10/2015    . PRESCRIPTION MEDICATION Allergy injection once a week.[facility] [employer] [facility]    . Probiotic Product (ALIGN) 4 MG CAPS Take 1 capsule by mouth daily. 21 capsule 0  . Wheat Dextrin (BENEFIBER DRINK MIX PO) Take 10 mLs by mouth every morning. Take 2 teaspoons every morning      . [DISCONTINUED] Linaclotide (LINZESS) 145 MCG CAPS Take 1 capsule by mouth daily. 8 capsule 0   No  current facility-administered medications on file prior to visit.    Allergies  Allergen Reactions  . Aloe   . Benadryl [Diphenhydramine Hcl] Other (See Comments)    Makes her feel "hyper"  . Cephalexin     REACTION: coarse rash  . Ciprofloxacin   . Mucinex [Guaifenesin Er] Other (See Comments)    Makes her feel worse  . Shellfish Allergy Hives  . Sudafed [Pseudoephedrine Hcl] Hives and Other (See Comments)    Itching and hives asthma  . Sulfonamide Derivatives    Family History  Problem Relation Age of Onset  . Diabetes Mother   . Hypertension Mother   . Thyroid disease Mother   . Hypertension Father   . Heart disease Father   . Thyroid disease Sister   . Colon cancer Neg Hx    Social History   Socioeconomic History  . Marital status: Married    Spouse name: Not on file  . Number of children: 2  . Years of education: some college  . Highest education level: Not on file  Occupational History  . Occupation: Retired  Scientific laboratory technician  . Financial resource strain: Not hard at all  . Food insecurity:    Worry: Never true    Inability: Never true  . Transportation needs:    Medical: No    Non-medical: No  Tobacco Use  . Smoking status: Never Smoker  . Smokeless tobacco: Never Used  Substance and Sexual Activity  . Alcohol use: No    Alcohol/week: 0.0 oz  . Drug use: No  . Sexual activity: Yes    Birth control/protection: Post-menopausal    Comment: number of sex partners in the last 12 months  1  Lifestyle  . Physical activity:    Days per week: 7 days    Minutes per session: 20 min  . Stress: Rather much  Relationships  . Social connections:    Talks on phone: More than three times a week    Gets together: More than three times a week    Attends religious service: Patient refused    Active member of club or organization: No    Attends meetings of clubs or organizations: Never    Relationship status: Married  Other Topics Concern  . Not on file  Social  History Narrative   Patient gets regular exercise. Cares for her grandchildren.   Depression screen [facility] 2/9 10/09/2017 10/03/2017 08/07/2017 06/04/2017 05/15/2017  Decreased Interest 0 0 0 0 0  Down, Depressed, Hopeless 0 0 0 2 0  PHQ - 2 Score 0 0 0 2 0  Altered sleeping - - - 1 -  Tired, decreased energy - - - 1 -  Change in appetite - - - 0 -  Feeling bad or failure about yourself  - - - 0 -  Trouble concentrating - - - 0 -  Moving slowly or fidgety/restless - - - 0 -  Suicidal thoughts - - - 0 -  PHQ-9 Score - - - 4 -  Difficult doing work/chores - - - Somewhat difficult -   Review of Systems  All other systems reviewed and are negative. unless otherwise noted in the HPI    Objective:   Physical Exam  Constitutional: She is oriented to Walker, place, and time. She appears well-developed and well-nourished. No distress.  HENT:  Head: Normocephalic and atraumatic.  Eyes: Conjunctivae and EOM are normal.  Neck: Neck supple. No tracheal deviation present.  Cardiovascular: Normal rate.  Pulmonary/Chest: Effort normal. No respiratory distress.  Musculoskeletal: Normal range of motion.  R flank: large hematoma  Neurological: She is alert and oriented to Walker, place, and time.  Skin: Skin is warm and dry.  Psychiatric: She has a normal mood and affect. Her behavior is normal.  Nursing note and vitals reviewed.  BP 140/84 (BP Location: Left Arm, Patient Position: Sitting, Cuff Size: Normal)   Pulse 95   Temp 97.7 F (36.5 C) (Oral)   Resp 18   Ht 5\' 4"  (1.626 m)   Wt 136 lb 6.4 oz (61.9 kg)   SpO2 100%   BMI 23.41 kg/m     EKG: NSR, no acute ischemic changes noted. No prior EKG available for comparison in Epic but will pull paper chart to compare to.   I have personally reviewed the EKG tracing and agree with the computer interpretation.  Assessment & Plan:  [facility] needs BMP faxed over to them. See telephone note 3/5. Last labs done at prior CPE 06/2016. No prior  TSH. Vit D normal 2 yrs ago.  1. Annual physical exam   2. Screening for cardiovascular, respiratory, and genitourinary diseases   3. Screening for deficiency anemia   4. Essential hypertension   5. Irritable bowel syndrome with constipation   6. Age-related osteoporosis without current pathological fracture   7. Intermittent asthma without complication, unspecified asthma severity   8. Medication monitoring encounter   9. Exposure to strep throat   10. Prediabetes   11. Mixed hyperlipidemia     Orders Placed This Encounter  Procedures  . Comprehensive metabolic panel    Order Specific Question:   Has the patient fasted?    Answer:   Yes  . Hemoglobin A1c  . Lipid panel    Order Specific Question:   Has the patient fasted?    Answer:   Yes  . CBC with Differential/Platelet  . TSH  . POCT urinalysis dipstick  . EKG 12-Lead    No orders of the defined types were placed in this encounter.   I personally performed the services described in this documentation, which was scribed in my presence. The recorded information has been reviewed and considered, and addended by me as needed.   [name], M.D.  Primary Care at [facility] [address] [place], [place] [address] [phone] phone [phone] fax  10/14/17 10:16 AM

## 2017-10-11 NOTE — Telephone Encounter (Signed)
Attempted to call pt re: refill request. VM full. Lopressor filled 10/08/17.

## 2017-10-12 LAB — COMPREHENSIVE METABOLIC PANEL
ALT: 18 IU/L (ref 0–32)
AST: 21 IU/L (ref 0–40)
Albumin/Globulin Ratio: 1.7 (ref 1.2–2.2)
Albumin: 4.8 g/dL (ref 3.5–4.8)
Alkaline Phosphatase: 123 IU/L — ABNORMAL HIGH (ref 39–117)
BUN/Creatinine Ratio: 17 (ref 12–28)
BUN: 13 mg/dL (ref 8–27)
Bilirubin Total: 0.5 mg/dL (ref 0.0–1.2)
CALCIUM: 10.9 mg/dL — AB (ref 8.7–10.3)
CO2: 25 mmol/L (ref 20–29)
CREATININE: 0.77 mg/dL (ref 0.57–1.00)
Chloride: 99 mmol/L (ref 96–106)
GFR, EST AFRICAN AMERICAN: 90 mL/min/{1.73_m2} (ref >59)
GFR, EST NON AFRICAN AMERICAN: 78 mL/min/{1.73_m2} (ref >59)
GLUCOSE: 116 mg/dL — AB (ref 65–99)
Globulin, Total: 2.9 g/dL (ref 1.5–4.5)
Potassium: 4.3 mmol/L (ref 3.5–5.2)
Sodium: 140 mmol/L (ref 134–144)
TOTAL PROTEIN: 7.7 g/dL (ref 6.0–8.5)

## 2017-10-12 LAB — CBC WITH DIFFERENTIAL/PLATELET
BASOS: 0 %
Basophils Absolute: 0 10*3/uL (ref 0.0–0.2)
EOS (ABSOLUTE): 0.1 10*3/uL (ref 0.0–0.4)
EOS: 1 %
HEMOGLOBIN: 14.5 g/dL (ref 11.1–15.9)
Hematocrit: 44.1 % (ref 34.0–46.6)
IMMATURE GRANS (ABS): 0 10*3/uL (ref 0.0–0.1)
IMMATURE GRANULOCYTES: 0 %
LYMPHS: 15 %
Lymphocytes Absolute: 1.6 10*3/uL (ref 0.7–3.1)
MCH: 29.2 pg (ref 26.6–33.0)
MCHC: 32.9 g/dL (ref 31.5–35.7)
MCV: 89 fL (ref 79–97)
MONOCYTES: 7 %
Monocytes Absolute: 0.7 10*3/uL (ref 0.1–0.9)
NEUTROS ABS: 8 10*3/uL — AB (ref 1.4–7.0)
NEUTROS PCT: 77 %
Platelets: 331 10*3/uL (ref 150–379)
RBC: 4.97 x10E6/uL (ref 3.77–5.28)
RDW: 13.6 % (ref 12.3–15.4)
WBC: 10.5 10*3/uL (ref 3.4–10.8)

## 2017-10-12 LAB — LIPID PANEL
Chol/HDL Ratio: 2.5 ratio (ref 0.0–4.4)
Cholesterol, Total: 175 mg/dL (ref 100–199)
HDL: 69 mg/dL (ref >39)
LDL Calculated: 95 mg/dL (ref 0–99)
TRIGLYCERIDES: 54 mg/dL (ref 0–149)
VLDL Cholesterol Cal: 11 mg/dL (ref 5–40)

## 2017-10-12 LAB — HEMOGLOBIN A1C
ESTIMATED AVERAGE GLUCOSE: 123 mg/dL
HEMOGLOBIN A1C: 5.9 % — AB (ref 4.8–5.6)

## 2017-10-12 LAB — TSH: TSH: 0.715 u[IU]/mL (ref 0.450–4.500)

## 2017-10-18 ENCOUNTER — Encounter: Payer: Self-pay | Admitting: *Deleted

## 2017-11-06 ENCOUNTER — Other Ambulatory Visit: Payer: Self-pay | Admitting: Family Medicine

## 2017-11-19 ENCOUNTER — Ambulatory Visit (INDEPENDENT_AMBULATORY_CARE_PROVIDER_SITE_OTHER): Payer: Medicare Other

## 2017-11-19 ENCOUNTER — Encounter: Payer: Self-pay | Admitting: Physician Assistant

## 2017-11-19 ENCOUNTER — Ambulatory Visit: Payer: Medicare Other | Admitting: Physician Assistant

## 2017-11-19 ENCOUNTER — Other Ambulatory Visit: Payer: Self-pay

## 2017-11-19 VITALS — BP 150/86 | HR 96 | Temp 98.9°F | Resp 16 | Ht 66.5 in | Wt 135.6 lb

## 2017-11-19 DIAGNOSIS — R05 Cough: Secondary | ICD-10-CM | POA: Diagnosis not present

## 2017-11-19 DIAGNOSIS — R059 Cough, unspecified: Secondary | ICD-10-CM

## 2017-11-19 MED ORDER — FLUTICASONE FUROATE 27.5 MCG/SPRAY NA SUSP: 2.0000 | Freq: Every day | NASAL | 12 refills | Status: DC

## 2017-11-19 NOTE — Progress Notes (Signed)
11/19/2017 11:16 AM   DOB: 01-15-1946 / MRN: [id number]  SUBJECTIVE:  Ruth Walker is a 72 y.o. female presenting for cough.  This started 5 days ago and is worsening. She was at her sister's house and this is unkept.  Denies chest pain, leg swelling, DOE, orthopnea.  She has tried loratadine with minimal improvement in symptoms.  Never smoker.   She is allergic to aloe; benadryl [diphenhydramine hcl]; cephalexin; ciprofloxacin; mucinex [guaifenesin er]; shellfish allergy; sudafed [pseudoephedrine hcl]; and sulfonamide derivatives.   She  has a past medical history of Allergy, Anal fissure, Anxiety states, Asthma, Cholelithiasis, Depression, Diverticulosis, Environmental allergies, Hemorrhoids, Irritable bowel syndrome (IBS), Osteoporosis, Skin cancer, Uterine polyp, and Vertebral compression fracture ([place]).    She  reports that she has never smoked. She has never used smokeless tobacco. She reports that she does not drink alcohol or use drugs. She  reports that she currently engages in sexual activity. She reports using the following method of birth control/protection: Post-menopausal. The patient  has a past surgical history that includes Cesarean section; Hemorrhoid surgery; Back surgery; Skin cancer excision; Endometrial polypectomy; Hysteroscopy; Dilation and curettage of uterus; Mouth surgery; Fracture surgery; and IBS.  Her family history includes Diabetes in her mother; Heart disease in her father; Hypertension in her father and mother; Thyroid disease in her mother and sister.  Review of Systems  Constitutional: Negative for fever.  HENT: Positive for congestion and sore throat. Negative for sinus pain.   Respiratory: Positive for cough.   Cardiovascular: Negative for orthopnea.  Musculoskeletal: Negative for myalgias.  Skin: Negative for rash.  Neurological: Negative for dizziness.    The problem list and medications were reviewed and updated by myself where necessary and  exist elsewhere in the encounter.   OBJECTIVE:  BP (!) 150/86 (BP Location: Left Arm)   Pulse 96   Temp 98.9 F (37.2 C) (Oral)   Resp 16   Ht 5' 6.5" (1.689 m)   Wt 135 lb 9.6 oz (61.5 kg)   SpO2 99%   BMI 21.56 kg/m   Wt Readings from Last 3 Encounters:  11/19/17 135 lb 9.6 oz (61.5 kg)  10/11/17 136 lb 6.4 oz (61.9 kg)  10/09/17 137 lb (62.1 kg)   Temp Readings from Last 3 Encounters:  11/19/17 98.9 F (37.2 C) (Oral)  10/11/17 97.7 F (36.5 C) (Oral)  10/03/17 99.1 F (37.3 C) (Oral)   BP Readings from Last 3 Encounters:  11/19/17 (!) 150/86  10/11/17 140/84  10/09/17 130/78   Pulse Readings from Last 3 Encounters:  11/19/17 96  10/11/17 95  10/09/17 71     Physical Exam  Constitutional: She is oriented to person, place, and time. She appears well-nourished. No distress.  Eyes: Pupils are equal, round, and reactive to light. EOM are normal.  Cardiovascular: Normal rate, regular rhythm, S1 normal, S2 normal, normal heart sounds and intact distal pulses. Exam reveals no gallop, no friction rub and no decreased pulses.  No murmur heard. Pulmonary/Chest: Effort normal. No stridor. No respiratory distress. She has no wheezes. She has no rales. She exhibits no tenderness.  Abdominal: She exhibits no distension.  Musculoskeletal: She exhibits no edema.  Neurological: She is alert and oriented to person, place, and time. No cranial nerve deficit. Gait normal.  Skin: Skin is dry. She is not diaphoretic.  Psychiatric: She has a normal mood and affect.  Vitals reviewed.   No results found for this or any previous visit (from the  past 72 hour(s)).  Dg Chest 2 View  Result Date: 11/19/2017 CLINICAL DATA:  Cough. EXAM: CHEST - 2 VIEW COMPARISON:  Chest x-ray dated March 09, 2016. FINDINGS: The heart size and mediastinal contours are within normal limits. Normal pulmonary vascularity. No focal consolidation, pleural effusion, or pneumothorax. No acute osseous  abnormality. Old T3 compression fracture status post cement augmentation. IMPRESSION: No active cardiopulmonary disease. Electronically Signed   By: Titus Dubin M.D.   On: 11/19/2017 11:04    ASSESSMENT AND PLAN:  Karysa was seen today for cough.  Diagnoses and all orders for this visit:  Cough: Most likely allergic in nature.  Rads clear.  Flonase prescribed and advised delsym otc.  Continue claritin.  -     DG Chest 2 View; Future -     Care order/instruction: -     fluticasone (FLONASE SENSIMIST) 27.5 MCG/SPRAY nasal spray; Place 2 sprays into the nose daily.    The patient is advised to call or return to clinic if she does not see an improvement in symptoms, or to seek the care of the closest emergency department if she worsens with the above plan.   Philis Fendt, MHS, PA-C Primary [facility] at [facility] 11/19/2017 11:16 AM

## 2017-11-19 NOTE — Patient Instructions (Addendum)
Your chest xray is clear. Continue your claritin. Please take some delsym for you cough.  This is an over the counter medication. For the congestion in your nose I recommend flonase. You have been prescribed this in the past.  I am represcribing.  Please schedule an appointment with Dr. Brigitte Pulse for follow up and come back if you cough is getting worse.  Go to the ED if you begin having chest pain, shortness of breath.     IF you received an x-ray today, you will receive an invoice from [facility]. Please contact [facility] at [phone] with questions or concerns regarding your invoice.   IF you received labwork today, you will receive an invoice from [facility]. Please contact [facility] at [phone] with questions or concerns regarding your invoice.   Our billing staff will not be able to assist you with questions regarding bills from these companies.  You will be contacted with the lab results as soon as they are available. The fastest way to get your results is to activate your My Chart account. Instructions are located on the last page of this paperwork. If you have not heard from [place] regarding the results in 2 weeks, please contact this office.

## 2017-12-05 ENCOUNTER — Other Ambulatory Visit: Payer: Self-pay | Admitting: Family Medicine

## 2017-12-10 ENCOUNTER — Other Ambulatory Visit: Payer: Self-pay

## 2017-12-10 ENCOUNTER — Ambulatory Visit: Payer: Medicare Other | Admitting: Family Medicine

## 2017-12-10 ENCOUNTER — Encounter: Payer: Self-pay | Admitting: Family Medicine

## 2017-12-10 VITALS — BP 180/96 | HR 86 | Temp 99.2°F | Resp 16 | Ht 64.0 in | Wt 137.4 lb

## 2017-12-10 DIAGNOSIS — L821 Other seborrheic keratosis: Secondary | ICD-10-CM | POA: Diagnosis not present

## 2017-12-10 DIAGNOSIS — F411 Generalized anxiety disorder: Secondary | ICD-10-CM | POA: Diagnosis not present

## 2017-12-10 DIAGNOSIS — Z1231 Encounter for screening mammogram for malignant neoplasm of breast: Secondary | ICD-10-CM

## 2017-12-10 NOTE — Patient Instructions (Addendum)
I recommend that you schedule a mammogram for breast cancer screening. Typically, you do not need a referral to do this. Please contact a local imaging center to schedule your mammogram.  [facility] - [phone]  *ask for the Radiology [place] ([place]) - [phone] or [phone]  [facility] - [phone] [place] [phone] [facility] - [phone]  *ask for the [facility] - [phone]  *ask for the Radiology Department [facility] - [phone]  *ask for the [place] - [phone]  Let me know if you need to increase the xanax to 3 nights/week - maybe Mon, Thur, Sat?    IF you received an x-ray today, you will receive an invoice from [facility]. Please contact [facility] at [phone] with questions or concerns regarding your invoice.   IF you received labwork today, you will receive an invoice from [facility]. Please contact [facility] at [phone] with questions or concerns regarding your invoice.   Our billing staff will not be able to assist you with questions regarding bills from these companies.  You will be contacted with the lab results as soon as they are available. The fastest way to get your results is to activate your My Chart account. Instructions are located on the last page of this paperwork. If you have not heard from [place] regarding the results in 2 weeks, please contact this office.    Seborrheic Keratosis Seborrheic keratosis is a common, noncancerous (benign) skin growth. This condition causes waxy, rough, tan, brown, or black spots to appear on the skin. These skin growths can be flat or raised. What are the causes? The cause of this condition is not known. What increases the risk? This condition is more likely to develop in:  People who have a family history  of seborrheic keratosis.  People who are 74 or older.  People who are pregnant.  People who have had estrogen replacement therapy.  What are the signs or symptoms? This condition often occurs on the face, chest, shoulders, back, or other areas. These growths:  Are usually painless, but may become irritated and itchy.  Can be yellow, brown, black, or other colors.  Are slightly raised or have a flat surface.  Are sometimes rough or wart-like in texture.  Are often waxy on the surface.  Are round or oval-shaped.  Sometimes look like they are "stuck on."  Often occur in groups, but may occur as a single growth.  How is this diagnosed? This condition is diagnosed with a medical history and physical exam. A sample of the growth may be tested (skin biopsy). You may need to see a skin specialist (dermatologist). How is this treated? Treatment is not usually needed for this condition, unless the growths are irritated or are often bleeding. You may also choose to have the growths removed if you do not like their appearance. Most commonly, these growths are treated with a procedure in which liquid nitrogen is applied to "freeze" off the growth (cryosurgery). They may also be burned off with electricity or cut off. Follow these instructions at home:  Watch your growth for any changes.  Keep all follow-up visits as told by your health care provider. This is important.  Do not scratch or pick at the growth or growths. This can cause them to become irritated or infected. Contact a health care provider if:  You suddenly  have many new growths.  Your growth bleeds, itches, or hurts.  Your growth suddenly becomes larger or changes color. This information is not intended to replace advice given to you by your health care provider. Make sure you discuss any questions you have with your health care provider. Document Released: 07/29/2010 Document Revised: 12/02/2015 Document Reviewed:  11/11/2014 Elsevier Interactive Patient Education  2018 [employer].

## 2017-12-10 NOTE — Progress Notes (Signed)
Subjective:  By signing my name below, I, Ruth Walker, attest that this documentation has been prepared under the direction and in the presence of [name], MD Electronically Signed: Ladene Artist, ED Scribe 12/10/2017 at 6:21 PM.   Patient ID: Ruth Walker, female    DOB: 1946-01-01, 72 y.o.   MRN: [id number]  Chief Complaint  Patient presents with  . Mole    under left breast, pt states "she just noticed it on yesterday".   HPI Ruth Walker is a 72 y.o. female who presents to Primary Care at [facility] complaining of a discolored mole under her L breast. Pt states that she has always had a mole where the under wire of her bra rubs the area, however, the mole recently became discolored. States she first noticed discoloration yesterday. She did not have a mammogram done yesterday but requests referral at this visit.   Past Medical History:  Diagnosis Date  . Allergy   . Anal fissure   . Anxiety states   . Asthma    Gets allergy shots once a week   . Cholelithiasis   . Depression   . Diverticulosis   . Environmental allergies   . Hemorrhoids   . Irritable bowel syndrome (IBS)   . Osteoporosis   . Skin cancer    Leg  . Uterine polyp   . Vertebral compression fracture [facility])    Current Outpatient Medications on File Prior to Visit  Medication Sig Dispense Refill  . ALPRAZolam (XANAX) 0.25 MG tablet TAKE 1 TABLET BY MOUTH TWICE DAILY AS NEEDED FOR ANXIETY 90 tablet 0  . aspirin 81 MG tablet Take 81 mg by mouth daily.      . Calcium Carbonate (CALCIUM 600 PO) Take 1 tablet by mouth 2 (two) times daily.    . cycloSPORINE (RESTASIS) 0.05 % ophthalmic emulsion 1 drop 2 (two) times daily.      Marland Kitchen estradiol (ESTRACE) 0.1 MG/GM vaginal cream Place 5.95 Applicatorfuls vaginally 2 (two) times a week. 42.5 g 8  . fluticasone (FLONASE SENSIMIST) 27.5 MCG/SPRAY nasal spray Place 2 sprays into the nose daily. 10 g 12  . fluticasone (FLONASE) 50 MCG/ACT nasal spray Place 2 sprays into both  nostrils daily. 16 g 0  . hydrochlorothiazide (HYDRODIURIL) 25 MG tablet TAKE 1 TABLET(25 MG) BY MOUTH DAILY 90 tablet 0  . loratadine (CLARITIN) 10 MG tablet TAKE 1 TABLET(10 MG) BY MOUTH DAILY 30 tablet 11  . metoprolol tartrate (LOPRESSOR) 25 MG tablet TAKE 1 TABLET BY MOUTH TWICE DAILY 180 tablet 0  . polyethylene glycol (MIRALAX / GLYCOLAX) packet Take 17 g by mouth daily. Reported on 07/10/2015    . PRESCRIPTION MEDICATION Allergy injection once a week.[facility] [employer]    . Probiotic Product (ALIGN) 4 MG CAPS Take 1 capsule by mouth daily. 21 capsule 0  . Wheat Dextrin (BENEFIBER DRINK MIX PO) Take 10 mLs by mouth every morning. Take 2 teaspoons every morning      . [DISCONTINUED] Linaclotide (LINZESS) 145 MCG CAPS Take 1 capsule by mouth daily. 8 capsule 0   No current facility-administered medications on file prior to visit.    Allergies  Allergen Reactions  . Aloe   . Benadryl [Diphenhydramine Hcl] Other (See Comments)    Makes her feel "hyper"  . Cephalexin     REACTION: coarse rash  . Ciprofloxacin   . Mucinex [Guaifenesin Er] Other (See Comments)    Makes her feel worse  . Shellfish Allergy  Hives  . Sudafed [Pseudoephedrine Hcl] Hives and Other (See Comments)    Itching and hives asthma  . Sulfonamide Derivatives    Past Surgical History:  Procedure Laterality Date  . BACK SURGERY    . CESAREAN SECTION    . DILATION AND CURETTAGE OF UTERUS    . Endometrial polypectomy    . FRACTURE SURGERY     neck (vertebrae)  . HEMORRHOID SURGERY    . HYSTEROSCOPY    . IBS    . MOUTH SURGERY    . SKIN CANCER EXCISION     Leg    Family History  Problem Relation Age of Onset  . Diabetes Mother   . Hypertension Mother   . Thyroid disease Mother   . Hypertension Father   . Heart disease Father   . Thyroid disease Sister   . Colon cancer Neg Hx    Social History   Socioeconomic History  . Marital status: Married    Spouse name: Not on file  . Number of  children: 2  . Years of education: some college  . Highest education level: Not on file  Occupational History  . Occupation: Retired  Scientific laboratory technician  . Financial resource strain: Not hard at all  . Food insecurity:    Worry: Never true    Inability: Never true  . Transportation needs:    Medical: No    Non-medical: No  Tobacco Use  . Smoking status: Never Smoker  . Smokeless tobacco: Never Used  Substance and Sexual Activity  . Alcohol use: No    Alcohol/week: 0.0 standard drinks  . Drug use: No  . Sexual activity: Yes    Birth control/protection: Post-menopausal    Comment: number of sex partners in the last 12 months  1  Lifestyle  . Physical activity:    Days per week: 7 days    Minutes per session: 20 min  . Stress: Rather much  Relationships  . Social connections:    Talks on phone: More than three times a week    Gets together: More than three times a week    Attends religious service: Patient refused    Active member of club or organization: No    Attends meetings of clubs or organizations: Never    Relationship status: Married  Other Topics Concern  . Not on file  Social History Narrative   Patient gets regular exercise. Cares for her grandchildren.   Depression screen [facility] 2/9 01/14/2018 11/19/2017 10/11/2017 10/09/2017 10/03/2017  Decreased Interest 0 0 0 0 0  Down, Depressed, Hopeless 0 0 0 0 0  PHQ - 2 Score 0 0 0 0 0  Altered sleeping - - - - -  Tired, decreased energy - - - - -  Change in appetite - - - - -  Feeling bad or failure about yourself  - - - - -  Trouble concentrating - - - - -  Moving slowly or fidgety/restless - - - - -  Suicidal thoughts - - - - -  PHQ-9 Score - - - - -  Difficult doing work/chores - - - - -    Review of Systems  Skin: Positive for color change (Mole Under L Breast).      Objective:   Physical Exam  Constitutional: She is oriented to person, place, and time. She appears well-developed and well-nourished. No distress.    HENT:  Head: Normocephalic and atraumatic.  Eyes: Conjunctivae and EOM are normal.  Neck: Neck supple. No tracheal deviation present.  Cardiovascular: Normal rate.  Pulmonary/Chest: Effort normal. No respiratory distress.  Seborrheic keratosis.  Musculoskeletal: Normal range of motion.  Neurological: She is alert and oriented to person, place, and time.  Skin: Skin is warm and dry.  Psychiatric: She has a normal mood and affect. Her behavior is normal.  Nursing note and vitals reviewed.  BP (!) 180/96 (BP Location: Left Arm, Patient Position: Sitting, Cuff Size: Normal)   Pulse 86   Temp 99.2 F (37.3 C) (Oral)   Resp 16   Ht 5\' 4"  (1.626 m)   Wt 137 lb 6.4 oz (62.3 kg)   SpO2 97%   BMI 23.58 kg/m     Assessment & Plan:   1. Seborrheic keratosis - pt reassured benign - offered removal but pt declined - just wanted clinical reassurance.  2. Encounter for screening mammogram for breast cancer   3. Anxiety state     Orders Placed This Encounter  Procedures  . MM Digital Screening    Standing Status:   Future    Standing Expiration Date:   02/10/2019    Order Specific Question:   Reason for Exam (SYMPTOM  OR DIAGNOSIS REQUIRED)    Answer:   screening for breast cancer    Order Specific Question:   Preferred imaging location?    Answer:   [facility]    I personally performed the services described in this documentation, which was scribed in my presence. The recorded information has been reviewed and considered, and addended by me as needed.   [name], M.D.  Primary Care at [facility] [address] [place], [place] [address] [phone] phone [phone] fax  04/18/18 1:14 AM

## 2018-01-14 ENCOUNTER — Other Ambulatory Visit: Payer: Self-pay

## 2018-01-14 ENCOUNTER — Encounter: Payer: Self-pay | Admitting: Family Medicine

## 2018-01-14 ENCOUNTER — Ambulatory Visit: Payer: Medicare Other | Admitting: Family Medicine

## 2018-01-14 VITALS — BP 132/84 | HR 100 | Temp 98.4°F | Ht 64.0 in | Wt 135.0 lb

## 2018-01-14 DIAGNOSIS — J029 Acute pharyngitis, unspecified: Secondary | ICD-10-CM

## 2018-01-14 LAB — POCT RAPID STREP A (OFFICE): Rapid Strep A Screen: NEGATIVE

## 2018-01-14 NOTE — Progress Notes (Signed)
7/8/20195:19 PM  SARAHANNE NOVAKOWSKI 27-May-1946, 72 y.o. female [id number]  Chief Complaint  Patient presents with  . Sore Throat    thinks she may have strep    HPI:   Patient is a 72 y.o. female  who presents today for 2-3 days of mild sore throat  She reports spending most of last week with her grandchildren Her daughter just called to inform her that her grandson was treated for strep throat with amox after cx came back positive She denies any fever, chills, swollen glands She denies any URI sx She does have allergies and postnasal drip which gives her intermittent sore throats  Fall Risk  01/14/2018 11/19/2017 10/11/2017 10/09/2017 10/03/2017  Falls in the past year? No - Yes Yes Yes  Number falls in past yr: - 1 1 1 1   Injury with Fall? - Yes No No Yes  Risk for fall due to : - - - (No Data) -  Risk for fall due to: Comment - - - tripped and fell  -  Follow up - - Falls evaluation completed Falls prevention discussed -     Depression screen [facility] 2/9 01/14/2018 11/19/2017 10/11/2017  Decreased Interest 0 0 0  Down, Depressed, Hopeless 0 0 0  PHQ - 2 Score 0 0 0  Altered sleeping - - -  Tired, decreased energy - - -  Change in appetite - - -  Feeling bad or failure about yourself  - - -  Trouble concentrating - - -  Moving slowly or fidgety/restless - - -  Suicidal thoughts - - -  PHQ-9 Score - - -  Difficult doing work/chores - - -    Allergies  Allergen Reactions  . Aloe   . Benadryl [Diphenhydramine Hcl] Other (See Comments)    Makes her feel "hyper"  . Cephalexin     REACTION: coarse rash  . Ciprofloxacin   . Mucinex [Guaifenesin Er] Other (See Comments)    Makes her feel worse  . Shellfish Allergy Hives  . Sudafed [Pseudoephedrine Hcl] Hives and Other (See Comments)    Itching and hives asthma  . Sulfonamide Derivatives     Prior to Admission medications   Medication Sig Start Date End Date Taking? Authorizing Provider  ALPRAZolam Duanne Moron) 0.25 MG tablet TAKE 1  TABLET BY MOUTH TWICE DAILY AS NEEDED FOR ANXIETY 05/10/17  Yes Shawnee Knapp, MD  aspirin 81 MG tablet Take 81 mg by mouth daily.     Yes [provider]  Calcium Carbonate (CALCIUM 600 PO) Take 1 tablet by mouth 2 (two) times daily.   Yes [provider]  cycloSPORINE (RESTASIS) 0.05 % ophthalmic emulsion 1 drop 2 (two) times daily.     Yes [provider]  estradiol (ESTRACE) 0.1 MG/GM vaginal cream Place 8.18 Applicatorfuls vaginally 2 (two) times a week. 02/24/15  Yes Roselee Culver, MD  fluticasone (FLONASE SENSIMIST) 27.5 MCG/SPRAY nasal spray Place 2 sprays into the nose daily. 11/19/17  Yes Tereasa Coop, PA-C  fluticasone (FLONASE) 50 MCG/ACT nasal spray Place 2 sprays into both nostrils daily. 03/02/17  Yes Timmothy Euler, [place] D, PA-C  hydrochlorothiazide (HYDRODIURIL) 25 MG tablet TAKE 1 TABLET(25 MG) BY MOUTH DAILY 10/11/17  Yes Shawnee Knapp, MD  loratadine (CLARITIN) 10 MG tablet TAKE 1 TABLET(10 MG) BY MOUTH DAILY 03/27/17  Yes Shawnee Knapp, MD  metoprolol tartrate (LOPRESSOR) 25 MG tablet TAKE 1 TABLET BY MOUTH TWICE DAILY 12/05/17  Yes Shawnee Knapp,  MD  polyethylene glycol (MIRALAX / GLYCOLAX) packet Take 17 g by mouth daily. Reported on 07/10/2015   Yes [provider]  PRESCRIPTION MEDICATION Allergy injection once a week.[facility]   Yes [provider]  Probiotic Product (ALIGN) 4 MG CAPS Take 1 capsule by mouth daily. 07/05/11  Yes Sable Feil, MD  Wheat Dextrin (BENEFIBER DRINK MIX PO) Take 10 mLs by mouth every morning. Take 2 teaspoons every morning     Yes [provider]  Linaclotide (LINZESS) 145 MCG CAPS Take 1 capsule by mouth daily. 09/05/11 10/03/11  Sable Feil, MD    Past Medical History:  Diagnosis Date  . Allergy   . Anal fissure   . Anxiety states   . Asthma    Gets allergy shots once a week   . Cholelithiasis   . Depression   . Diverticulosis   . Environmental allergies     . Hemorrhoids   . Irritable bowel syndrome (IBS)   . Osteoporosis   . Skin cancer    Leg  . Uterine polyp   . Vertebral compression fracture [facility])     Past Surgical History:  Procedure Laterality Date  . BACK SURGERY    . CESAREAN SECTION    . DILATION AND CURETTAGE OF UTERUS    . Endometrial polypectomy    . FRACTURE SURGERY     neck (vertebrae)  . HEMORRHOID SURGERY    . HYSTEROSCOPY    . IBS    . MOUTH SURGERY    . SKIN CANCER EXCISION     Leg     Social History   Tobacco Use  . Smoking status: Never Smoker  . Smokeless tobacco: Never Used  Substance Use Topics  . Alcohol use: No    Alcohol/week: 0.0 oz    Family History  Problem Relation Age of Onset  . Diabetes Mother   . Hypertension Mother   . Thyroid disease Mother   . Hypertension Father   . Heart disease Father   . Thyroid disease Sister   . Colon cancer Neg Hx     ROS Per  hpi  OBJECTIVE:  Blood pressure 132/84, pulse 100, temperature 98.4 F (36.9 C), temperature source Oral, height 5\' 4"  (1.626 m), weight 135 lb (61.2 kg), SpO2 100 %.  Physical Exam  Constitutional: She is oriented to person, place, and time. She appears well-developed and well-nourished.  HENT:  Head: Normocephalic and atraumatic.  Right Ear: Hearing, tympanic membrane, external ear and ear canal normal.  Left Ear: Hearing, tympanic membrane, external ear and ear canal normal.  Mouth/Throat: Oropharynx is clear and moist. No oropharyngeal exudate, posterior oropharyngeal edema or posterior oropharyngeal erythema.  Eyes: Pupils are equal, round, and reactive to light. EOM are normal.  Neck: Neck supple.  Cardiovascular: Normal rate, regular rhythm and normal heart sounds. Exam reveals no gallop and no friction rub.  No murmur heard. Pulmonary/Chest: Effort normal and breath sounds normal. She has no wheezes. She has no rales.  Lymphadenopathy:    She has no cervical adenopathy.  Neurological: She is alert and oriented  to person, place, and time.  Skin: Skin is warm and dry.  Nursing note and vitals reviewed.    ASSESSMENT and PLAN  1. Sore throat  Exam reassuring afebrile, rapid strep a neg. However has exposure and worried about reinfection, therefore will send throat cx. Continue with supportive measures. - POCT rapid strep A - Culture, Group A Strep  Return if symptoms worsen or fail to improve.    Rutherford Guys, MD Primary Care at [place],  [address] Ph.  [phone] Fax [phone]

## 2018-01-14 NOTE — Patient Instructions (Addendum)
     IF you received an x-ray today, you will receive an invoice from [facility]. Please contact [facility] at [phone] with questions or concerns regarding your invoice.   IF you received labwork today, you will receive an invoice from [facility]. Please contact [facility] at [phone] with questions or concerns regarding your invoice.   Our billing staff will not be able to assist you with questions regarding bills from these companies.  You will be contacted with the lab results as soon as they are available. The fastest way to get your results is to activate your My Chart account. Instructions are located on the last page of this paperwork. If you have not heard from us regarding the results in 2 weeks, please contact this office.     Sore Throat A sore throat is pain, burning, irritation, or scratchiness in the throat. When you have a sore throat, you may feel pain or tenderness in your throat when you swallow or talk. Many things can cause a sore throat, including:  An infection.  Seasonal allergies.  Dryness in the air.  Irritants, such as smoke or pollution.  Gastroesophageal reflux disease (GERD).  A tumor.  A sore throat is often the first sign of another sickness. It may happen with other symptoms, such as coughing, sneezing, fever, and swollen neck glands. Most sore throats go away without medical treatment. Follow these instructions at home:  Take over-the-counter medicines only as told by your health care provider.  Drink enough fluids to keep your urine clear or pale yellow.  Rest as needed.  To help with pain, try: ? Sipping warm liquids, such as broth, herbal tea, or warm water. ? Eating or drinking cold or frozen liquids, such as frozen ice pops. ? Gargling with a salt-water mixture 3-4 times a day or as needed. To make a salt-water mixture, completely dissolve -1 tsp of salt in 1 cup of warm water. ? Sucking on hard candy or throat  lozenges. ? Putting a cool-mist humidifier in your bedroom at night to moisten the air. ? Sitting in the bathroom with the door closed for 5-10 minutes while you run hot water in the shower.  Do not use any tobacco products, such as cigarettes, chewing tobacco, and e-cigarettes. If you need help quitting, ask your health care provider. Contact a health care provider if:  You have a fever for more than 2-3 days.  You have symptoms that last (are persistent) for more than 2-3 days.  Your throat does not get better within 7 days.  You have a fever and your symptoms suddenly get worse. Get help right away if:  You have difficulty breathing.  You cannot swallow fluids, soft foods, or your saliva.  You have increased swelling in your throat or neck.  You have persistent nausea and vomiting. This information is not intended to replace advice given to you by your health care provider. Make sure you discuss any questions you have with your health care provider. Document Released: 08/03/2004 Document Revised: 02/20/2016 Document Reviewed: 04/16/2015 Elsevier Interactive Patient Education  2018 [employer] Inc[employer]  

## 2018-01-16 LAB — CULTURE, GROUP A STREP: Strep A Culture: NEGATIVE

## 2018-01-21 ENCOUNTER — Telehealth: Payer: Self-pay | Admitting: *Deleted

## 2018-01-21 NOTE — Telephone Encounter (Signed)
Pt calling for results of Strep test; not released to [facility]. CB # [phone]

## 2018-01-21 NOTE — Telephone Encounter (Signed)
Pt message re: lab/strep results sent to Dr. Pamella Pert

## 2018-01-22 NOTE — Telephone Encounter (Signed)
Patient checking status.

## 2018-01-22 NOTE — Telephone Encounter (Signed)
Please let patient know that her throat culture came back negative

## 2018-01-22 NOTE — Telephone Encounter (Signed)
Left message for patient to call back crm done

## 2018-02-01 ENCOUNTER — Other Ambulatory Visit: Payer: Self-pay | Admitting: Family Medicine

## 2018-02-01 DIAGNOSIS — I10 Essential (primary) hypertension: Secondary | ICD-10-CM

## 2018-02-20 ENCOUNTER — Other Ambulatory Visit: Payer: Self-pay | Admitting: Family Medicine

## 2018-03-01 ENCOUNTER — Other Ambulatory Visit: Payer: Self-pay | Admitting: Family Medicine

## 2018-03-01 DIAGNOSIS — F411 Generalized anxiety disorder: Secondary | ICD-10-CM

## 2018-03-01 NOTE — Telephone Encounter (Signed)
alprazolam refill Last Refill:05/12/17 # 90 Last OV: 12/10/17 PCP: Dr [name] Pharmacy: [facility] [place], [place]

## 2018-03-04 ENCOUNTER — Other Ambulatory Visit: Payer: Self-pay | Admitting: Family Medicine

## 2018-03-04 DIAGNOSIS — F411 Generalized anxiety disorder: Secondary | ICD-10-CM

## 2018-03-05 NOTE — Telephone Encounter (Signed)
Patient is requesting a refill of the following medications:  Date of patient request: 03/01/18 Last office visit: 12/10/2017 Date of last refill: 05/20/2017 Last refill amount:90 Follow up time period per chart: n/a  Please advise. Dgaddy, CMA

## 2018-03-05 NOTE — Telephone Encounter (Signed)
alprazolam refill Last Refill:03/01/18 # 90 Last OV: 11/30/17 PCP: Dr [name] [facility]acy: [facility]   Please see initial request sent on 03/01/18

## 2018-03-05 NOTE — Telephone Encounter (Signed)
Patient is requesting a refill of the following medications: Requested Prescriptions   Pending Prescriptions Disp Refills  . ALPRAZolam (XANAX) 0.25 MG tablet [Pharmacy Med Name: ALPRAZOLAM 0.25MG  TABLETS] 90 tablet 0    Sig: TAKE 1 TABLET BY MOUTH TWICE DAILY AS NEEDED FOR ANXIETY    Date of patient request: 03/04/2018 Last office visit: 12/10/2017 Date of last refill: 05/10/2017 Last refill amount: 90 Follow up time period per chart:

## 2018-03-08 NOTE — Telephone Encounter (Signed)
Patient called to check the status of her refill request for Alprazolam.  PEC called the office and was told that Lexine Baton, the clinical manager would call patient and explain why it has taken so long to get this prescription refilled.  Patient left a number for office to call if they call after 4pm [phone]).  Patient was very upset because she only has a few pills left and feels that she cannot be without this medication for a period of time.  Patient stated that if she does not hear back from someone by the end of day today 03/08/18, she will be calling the office again. Please advise.

## 2018-03-13 ENCOUNTER — Telehealth: Payer: Self-pay | Admitting: Family Medicine

## 2018-03-13 NOTE — Telephone Encounter (Signed)
PA was not required. Spoke with pharmacy and patient picked up the medication (Alprazolam) on 03/10/2018.

## 2018-03-28 ENCOUNTER — Other Ambulatory Visit: Payer: Self-pay | Admitting: Family Medicine

## 2018-03-29 NOTE — Telephone Encounter (Signed)
Claritin10mg  refill Last Refill:02/20/18 # 30 Last OV: 03/02/18 PCP: Brigitte Pulse Pharmacy:[facility] [phone]

## 2018-04-01 NOTE — Telephone Encounter (Signed)
Spoke with Truman Hayward from pharmacy today who states prescription for lloratadine (CLARITIN) 10 MG tablet  is ready for pick up

## 2018-05-01 ENCOUNTER — Other Ambulatory Visit: Payer: Self-pay | Admitting: Family Medicine

## 2018-05-02 ENCOUNTER — Other Ambulatory Visit: Payer: Self-pay | Admitting: Family Medicine

## 2018-05-02 DIAGNOSIS — I10 Essential (primary) hypertension: Secondary | ICD-10-CM

## 2018-05-02 NOTE — Telephone Encounter (Signed)
Requested Prescriptions  Pending Prescriptions Disp Refills  . hydrochlorothiazide (HYDRODIURIL) 25 MG tablet [Pharmacy Med Name: HYDROCHLOROTHIAZIDE 25MG  TABLETS] 90 tablet 0    Sig: TAKE 1 TABLET(25 MG) BY MOUTH DAILY     Cardiovascular: Diuretics - Thiazide Failed - 05/02/2018  6:13 AM      Failed - Ca in normal range and within 360 days    Calcium  Date Value Ref Range Status  10/11/2017 10.9 (H) 8.7 - 10.3 mg/dL Final         Passed - Cr in normal range and within 360 days    Creat  Date Value Ref Range Status  09/23/2014 0.70 0.50 - 1.10 mg/dL Final   Creatinine, Ser  Date Value Ref Range Status  10/11/2017 0.77 0.57 - 1.00 mg/dL Final         Passed - K in normal range and within 360 days    Potassium  Date Value Ref Range Status  10/11/2017 4.3 3.5 - 5.2 mmol/L Final         Passed - Na in normal range and within 360 days    Sodium  Date Value Ref Range Status  10/11/2017 140 134 - 144 mmol/L Final         Passed - Last BP in normal range    BP Readings from Last 1 Encounters:  01/14/18 132/84         Passed - Valid encounter within last 6 months    Recent Outpatient Visits          3 months ago Sore throat   [facility], Lilia Argue, MD   4 months ago Seborrheic keratosis   [facility], Laurey Arrow, MD   5 months ago Cough   [facility] at [place], PA-C   6 months ago Annual physical exam   [facility], Laurey Arrow, MD   7 months ago Fall, initial encounter   [facility] at [facility], [place], [place]

## 2018-05-27 ENCOUNTER — Telehealth: Payer: Self-pay | Admitting: Gastroenterology

## 2018-05-27 NOTE — Progress Notes (Signed)
Referring Provider: Shawnee Knapp, MD Primary Care Physician:  Shawnee Knapp, MD   Reason for Office Visit:  hemorrhoids    IMPRESSION:  Anal fissure with associated bleeding Rectal bleeding from internal hemorrhoids Prior hemorrhoidectomy from thrombosed hemorrhoids Chronic constipation on Miralax Last colonoscopy 2010 Mild diverticulosis   Anal fissure.  Exacerbated by severe anxiety. Surveillance colonoscopy due 2020.   PLAN: -Nitroglycerin ointment 0.125% gel applied to the rectum TID x 6-8 weeks  -Constipation recommendations including increasing daily water intake -Continue Miralax, Benefiber, and Align -Surveillance colonoscopy 2020 -Improved control of her anxiety would improve her GI health, I encouraged her to review treatment options with her primary care provider.  -Follow-up in the office PRN - I asked her to discuss her questions about calcium and vitamin D with her primary care provider.   HPI: Ruth Walker is a 72 y.o. female. This is my first visit with the patient. She was previously seen by Dr. [name] and Dr. Carlis Abbott.  Seen earlier this fall with 5/2018with an anal fissure by urgent care, but, not seen on rectal exam here. Extremely aggressive perianal hygiene.  Has constipation, which has overall been stable on Miralax daily but occasionally still has mild issues. Prescribed anusol suppositories for internal hemorrhoids.   Seen today for and concerns for rectal bleeding. She has chronic abdominal pain with associated bloating. Controlling her diet improves her symptoms. She uses Benefiber, Miralax, and Align on a daily basis to control her symptoms.  48 hours ago could not defecate. Severe straining with an associated feeling of tearing. Blood on the toilet paper and on the stool since that time.  Associated anxiety which increases her straining. No sense of incomplete evacuation. No tenesmus. Stressful situation at home. Takes Xanax. She associates her stress  level with IBS.  Does not like to take medications related to it. Worried about a thrombosed hemorrhoid. No other associated symptoms. No identified exacerbating or relieving features.   She has questions about the dosing of her calcium and vitamin D.   Past Medical History:  Diagnosis Date  . Allergy   . Anal fissure   . Anxiety states   . Asthma    Gets allergy shots once a week   . Cholelithiasis   . Depression   . Diverticulosis   . Environmental allergies   . Hemorrhoids   . Irritable bowel syndrome (IBS)   . Osteoporosis   . Skin cancer    Leg  . Uterine polyp   . Vertebral compression fracture [facility])     Past Surgical History:  Procedure Laterality Date  . BACK SURGERY    . CESAREAN SECTION    . DILATION AND CURETTAGE OF UTERUS    . Endometrial polypectomy    . FRACTURE SURGERY     neck (vertebrae)  . HEMORRHOID SURGERY    . HYSTEROSCOPY    . IBS    . MOUTH SURGERY    . SKIN CANCER EXCISION     Leg     Current Outpatient Medications  Medication Sig Dispense Refill  . ALPRAZolam (XANAX) 0.25 MG tablet TAKE 1 TABLET BY MOUTH TWICE DAILY AS NEEDED FOR ANXIETY 60 tablet 0  . aspirin 81 MG tablet Take 81 mg by mouth daily.      . Calcium Carbonate (CALCIUM 600 PO) Take 1 tablet by mouth 2 (two) times daily.    . cycloSPORINE (RESTASIS) 0.05 % ophthalmic emulsion 1 drop 2 (two) times daily.      Marland Kitchen  estradiol (ESTRACE) 0.1 MG/GM vaginal cream Place 4.85 Applicatorfuls vaginally 2 (two) times a week. 42.5 g 8  . fluticasone (FLONASE) 50 MCG/ACT nasal spray Place 2 sprays into both nostrils daily. (Patient taking differently: Place 2 sprays into both nostrils as needed. ) 16 g 0  . hydrochlorothiazide (HYDRODIURIL) 25 MG tablet TAKE 1 TABLET(25 MG) BY MOUTH DAILY 90 tablet 0  . loratadine (CLARITIN) 10 MG tablet TAKE 1 TABLET(10 MG) BY MOUTH DAILY 90 tablet 2  . metoprolol tartrate (LOPRESSOR) 25 MG tablet TAKE 1 TABLET BY MOUTH TWICE DAILY 180 tablet 0  . polyethylene  glycol (MIRALAX / GLYCOLAX) packet Take 17 g by mouth daily. Reported on 07/10/2015    . PRESCRIPTION MEDICATION Allergy injection once a week.[facility] [employer] [facility]    . Probiotic Product (ALIGN) 4 MG CAPS Take 1 capsule by mouth daily. 21 capsule 0  . Wheat Dextrin (BENEFIBER DRINK MIX PO) Take 10 mLs by mouth every morning. Take 2 teaspoons every morning       No current facility-administered medications for this visit.     Allergies as of 05/28/2018 - Review Complete 05/28/2018  Allergen Reaction Noted  . Aloe  12/06/2011  . Benadryl [diphenhydramine hcl] Other (See Comments) 06/24/2012  . Cephalexin  08/25/2008  . Ciprofloxacin  12/06/2011  . Mucinex [guaifenesin er] Other (See Comments) 06/24/2012  . Shellfish allergy Hives 12/06/2011  . Sudafed [pseudoephedrine hcl] Hives and Other (See Comments) 06/24/2012  . Sulfonamide derivatives  02/28/2008    Family History  Problem Relation Age of Onset  . Diabetes Mother   . Hypertension Mother   . Thyroid disease Mother   . Hypertension Father   . Heart disease Father   . Thyroid disease Sister   . Colon cancer Neg Hx     Social History   Socioeconomic History  . Marital status: Married    Spouse name: Not on file  . Number of children: 2  . Years of education: some college  . Highest education level: Not on file  Occupational History  . Occupation: Retired  Scientific laboratory technician  . Financial resource strain: Not hard at all  . Food insecurity:    Worry: Never true    Inability: Never true  . Transportation needs:    Medical: No    Non-medical: No  Tobacco Use  . Smoking status: Never Smoker  . Smokeless tobacco: Never Used  Substance and Sexual Activity  . Alcohol use: No    Alcohol/week: 0.0 standard drinks  . Drug use: No  . Sexual activity: Yes    Birth control/protection: Post-menopausal    Comment: number of sex partners in the last 12 months  1  Lifestyle  . Physical activity:    Days per week: 7  days    Minutes per session: 20 min  . Stress: Rather much  Relationships  . Social connections:    Talks on phone: More than three times a week    Gets together: More than three times a week    Attends religious service: Patient refused    Active member of club or organization: No    Attends meetings of clubs or organizations: Never    Relationship status: Married  . Intimate partner violence:    Fear of current or ex partner: No    Emotionally abused: No    Physically abused: No    Forced sexual activity: No  Other Topics Concern  . Not on file  Social History Narrative  Patient gets regular exercise. Cares for her grandchildren.    Review of Systems: 12 system ROS is negative except as noted above.  Filed Weights   05/28/18 1032  Weight: 137 lb 6 oz (62.3 kg)    Physical Exam: Vital signs were reviewed. General:   Alert, well-nourished, pleasant and cooperative in NAD Head:  Normocephalic and atraumatic. Eyes:  Sclera clear, no icterus.   Conjunctiva pink. Mouth:  No deformity or lesions.   Neck:  Supple; no thyromegaly. Lungs:  Clear throughout to auscultation.   No wheezes. Heart:  Regular rate and rhythm; no murmurs Abdomen:  Soft, nontender, normal bowel sounds. No rebound or guarding. No hepatosplenomegaly Rectal:  Perianal chemical dermatitis. Fissure present at 6 0'clock. No prolapsing hemorrhoids or thrombosed hemorrhoids. No rectal prolapse. Normal anocutaneous reflex. No stool in the rectal vault. No mass or fecal impaction. Normal anal resting tone.   Msk:  Symmetrical without gross deformities. Extremities:  No gross deformities or edema. Neurologic:  Alert and  oriented x4;  grossly nonfocal Skin:  No rash or bruise. Psych:  Anxious. Alert and cooperative. Normal mood and affect.   Ruth Walker L. Tarri Glenn Md, MPH [facility] 05/28/2018, 10:58 AM

## 2018-05-27 NOTE — Telephone Encounter (Signed)
Patient wants to know what to do states that she has rectal bleeding and painful bms to the point that she is afraid to use the restroom. Pt is scheduled for tomorrow

## 2018-05-27 NOTE — Telephone Encounter (Signed)
Patient had some BRB with bm yesterday, history of hemorrhoids. Advised to try the Miralax BID, stop the morning Benefiber. Advised her to keep her appointment with Dr. Payton Emerald tomorrow and ask about medications going forward.

## 2018-05-28 ENCOUNTER — Ambulatory Visit (INDEPENDENT_AMBULATORY_CARE_PROVIDER_SITE_OTHER): Payer: Medicare Other | Admitting: Gastroenterology

## 2018-05-28 ENCOUNTER — Encounter: Payer: Self-pay | Admitting: Gastroenterology

## 2018-05-28 VITALS — BP 126/84 | HR 78 | Ht 65.0 in | Wt 137.4 lb

## 2018-05-28 DIAGNOSIS — K5909 Other constipation: Secondary | ICD-10-CM | POA: Diagnosis not present

## 2018-05-28 DIAGNOSIS — K625 Hemorrhage of anus and rectum: Secondary | ICD-10-CM | POA: Diagnosis not present

## 2018-05-28 DIAGNOSIS — K602 Anal fissure, unspecified: Secondary | ICD-10-CM

## 2018-05-28 MED ORDER — AMBULATORY NON FORMULARY MEDICATION: 1 refills | Status: DC

## 2018-05-28 NOTE — Patient Instructions (Addendum)
We have sent a prescription for nitroglycerin 0.125% gel to [facility]. You should apply a pea size amount to your rectum three times daily x 6-8 weeks.  [facility]'s information is below: Address: [address] [place], [place] [address]  [phone]  *Please DO NOT go directly from our office to pick up this medication! Give the pharmacy 1 day to process the prescription as this is compounded at takes time to make.  Use Recticare with lidocaine Ointment this is an over the counter ointment that can be found at most pharmacies on the hemorrhoid aisle.     Toileting tips to help with your constipation  - Drink at least 64 ounces of water daily.   - Continue daily Benefiber, Miralax, and Align.  - Establish a time to try to move your bowels every day. For many people, this is after a cup of coffee.  - Sit all of the way back on the toilet keeping your back fairly straight and lean forward bending from your hips, resting your forearms on your thighs. Raising your feet with a step stool can be helpful.  - Relax the rectum feeling it bulge toward the toilet water. If you feel your rectum raising toward your body, you are contracting rather than relaxing.  - Breathe in and slowly exhale. "Belly breath" by expanding your belly towards your belly button. Keep belly expanded as you gently direct pressure down and back to the anus. A low pitched GRRR sound can assist with increasing intra-abdominal pressure.   - Repeat 3-4 times. If unsuccessful, contract the pelvic floor to restore normal tone and get off the toilet. Avoid excessive straining.  - To reduce excessive wiping by teaching your anus to normally contract, place hands on outer aspect of knees and resist knee movement outward. Hold 5-10 second then place hands just inside of knees and resist inward movement of knees. Hold 5 seconds. Repeat a few times each way.

## 2018-05-29 ENCOUNTER — Telehealth: Payer: Self-pay | Admitting: Gastroenterology

## 2018-05-29 ENCOUNTER — Other Ambulatory Visit: Payer: Self-pay | Admitting: Family Medicine

## 2018-05-29 NOTE — Telephone Encounter (Signed)
Spoke to patient and informed her that Rx has been changed but that lidocaine is just to help the pain. She is ok with not having lidocaine. Spoke to [place] at [facility] she states it is only a $2 difference but she will make it without lidocaine per patients preference.

## 2018-06-11 ENCOUNTER — Other Ambulatory Visit: Payer: Self-pay

## 2018-06-11 ENCOUNTER — Ambulatory Visit (INDEPENDENT_AMBULATORY_CARE_PROVIDER_SITE_OTHER): Payer: Medicare Other

## 2018-06-11 ENCOUNTER — Encounter: Payer: Self-pay | Admitting: Family Medicine

## 2018-06-11 ENCOUNTER — Ambulatory Visit: Payer: Medicare Other | Admitting: Family Medicine

## 2018-06-11 VITALS — BP 160/73 | HR 75 | Temp 98.3°F | Resp 14 | Ht 65.0 in | Wt 140.0 lb

## 2018-06-11 DIAGNOSIS — Y92009 Unspecified place in unspecified non-institutional (private) residence as the place of occurrence of the external cause: Principal | ICD-10-CM

## 2018-06-11 DIAGNOSIS — W19XXXA Unspecified fall, initial encounter: Secondary | ICD-10-CM

## 2018-06-11 DIAGNOSIS — S300XXA Contusion of lower back and pelvis, initial encounter: Secondary | ICD-10-CM | POA: Diagnosis not present

## 2018-06-11 NOTE — Patient Instructions (Addendum)
I do not see any bruising on exam today, but sometimes that can take a few days.  I will check an x-ray, but I do not think he broke any bones.  Soreness in other muscles should also improve with a little bit of time.  Tylenol if needed, occasional ibuprofen if Tylenol is not helpful.  Heat or ice to affected areas.  Shoulders, arms, wrist all look okay today. Return to the clinic or go to the nearest emergency room if any of your symptoms worsen or new symptoms occur.   Contusion A contusion is a deep bruise. Contusions are the result of a blunt injury to tissues and muscle fibers under the skin. The injury causes bleeding under the skin. The skin overlying the contusion may turn blue, purple, or yellow. Minor injuries will give you a painless contusion, but more severe contusions may stay painful and swollen for a few weeks. What are the causes? This condition is usually caused by a blow, trauma, or direct force to an area of the body. What are the signs or symptoms? Symptoms of this condition include:  Swelling of the injured area.  Pain and tenderness in the injured area.  Discoloration. The area may have redness and then turn blue, purple, or yellow.  How is this diagnosed? This condition is diagnosed based on a physical exam and medical history. An X-ray, CT scan, or MRI may be needed to determine if there are any associated injuries, such as broken bones (fractures). How is this treated? Specific treatment for this condition depends on what area of the body was injured. In general, the best treatment for a contusion is resting, icing, applying pressure to (compression), and elevating the injured area. This is often called the RICE strategy. Over-the-counter anti-inflammatory medicines may also be recommended for pain control. Follow these instructions at home:  Rest the injured area.  If directed, apply ice to the injured area: ? Put ice in a plastic bag. ? Place a towel between  your skin and the bag. ? Leave the ice on for 20 minutes, 2-3 times per day.  If directed, apply light compression to the injured area using an elastic bandage. Make sure the bandage is not wrapped too tightly. Remove and reapply the bandage as directed by your health care provider.  If possible, raise (elevate) the injured area above the level of your heart while you are sitting or lying down.  Take over-the-counter and prescription medicines only as told by your health care provider. Contact a health care provider if:  Your symptoms do not improve after several days of treatment.  Your symptoms get worse.  You have difficulty moving the injured area. Get help right away if:  You have severe pain.  You have numbness in a hand or foot.  Your hand or foot turns pale or cold. This information is not intended to replace advice given to you by your health care provider. Make sure you discuss any questions you have with your health care provider. Document Released: 04/05/2005 Document Revised: 11/04/2015 Document Reviewed: 11/11/2014 Elsevier Interactive Patient Education  [employer].   If you have lab work done today you will be contacted with your lab results within the next 2 weeks.  If you have not heard from [place] then please contact us. The fastest way to get your results is to register for My Chart.   IF you received an x-ray today, you will receive an invoice from [facility]. Please contact  [facility] at [phone] with questions or concerns regarding your invoice.   IF you received labwork today, you will receive an invoice from [place]. Please contact [facility] at [phone] with questions or concerns regarding your invoice.   Our billing staff will not be able to assist you with questions regarding bills from these companies.  You will be contacted with the lab results as soon as they are available. The fastest way to get your results is to  activate your My Chart account. Instructions are located on the last page of this paperwork. If you have not heard from [place] regarding the results in 2 weeks, please contact this office.

## 2018-06-11 NOTE — Progress Notes (Signed)
Subjective:    Patient ID: Ruth Walker, female    DOB: Oct 10, 1945, 72 y.o.   MRN: [id number]  HPI  Ruth Walker is a 72 y.o. female Presents today for: Chief Complaint  Patient presents with  . Fall    lost footing and fell injury to right butt cheek, shoulders are sore due to caught self as i was falling   Fall last night in her den last night. Bending down to plug phone in. Lost balance and fell backwards onto wood and metal bakers rack. R buttock pain.  Caught self with both hands. Sore in shoulders and arms, but moving ok. No weakness. No wrist injury/pains. Hx of osteoporosis.  Sore in some muscles, but no pain with weightbearing or weakness with standing. No hip pain. No visible bruising.   No CP/dyspnea, headache or dizziness at time of fall. No head injury.   Tx: ibuprofen few times last night and ice pack.   Patient Active Problem List   Diagnosis Date Noted  . Perianal irritation 12/01/2016  . Allergic rhinitis 11/14/2016  . External hemorrhoids 03/04/2012  . DVT (deep venous thrombosis) ([place])   . Uterine polyp   . Osteoporosis   . Vertebral compression fracture ([place])   . IBS (irritable bowel syndrome) 09/05/2011  . Constipation, slow transit 09/05/2011  . Anxiety disorder 09/05/2011  . Diverticulosis 11/04/2010  . Gallstones 11/04/2010  . ANAL FISSURE 05/26/2008  . OBSESSIVE-COMPULSIVE PERSONALITY DISORDER 02/28/2008  . Essential hypertension 02/28/2008  . Hemorrhoids 02/28/2008  . Asthma 02/28/2008  . CYSTITIS, CHRONIC 02/28/2008   Past Medical History:  Diagnosis Date  . Allergy   . Anal fissure   . Anxiety states   . Asthma    Gets allergy shots once a week   . Cholelithiasis   . Depression   . Diverticulosis   . Environmental allergies   . Hemorrhoids   . Irritable bowel syndrome (IBS)   . Osteoporosis   . Skin cancer    Leg  . Uterine polyp   . Vertebral compression fracture [facility])    Past Surgical History:  Procedure Laterality Date    . BACK SURGERY    . CESAREAN SECTION    . DILATION AND CURETTAGE OF UTERUS    . Endometrial polypectomy    . FRACTURE SURGERY     neck (vertebrae)  . HEMORRHOID SURGERY    . HYSTEROSCOPY    . IBS    . MOUTH SURGERY    . SKIN CANCER EXCISION     Leg    Allergies  Allergen Reactions  . Aloe   . Benadryl [Diphenhydramine Hcl] Other (See Comments)    Makes her feel "hyper"  . Cephalexin     REACTION: coarse rash  . Ciprofloxacin   . Mucinex [Guaifenesin Er] Other (See Comments)    Makes her feel worse  . Shellfish Allergy Hives  . Sudafed [Pseudoephedrine Hcl] Hives and Other (See Comments)    Itching and hives asthma  . Sulfonamide Derivatives    Prior to Admission medications   Medication Sig Start Date End Date Taking? Authorizing Provider  ALPRAZolam Duanne Moron) 0.25 MG tablet TAKE 1 TABLET BY MOUTH TWICE DAILY AS NEEDED FOR ANXIETY 03/08/18  Yes Weber, Sarah L, PA-C  AMBULATORY NON FORMULARY MEDICATION Medication Name: Nitroglycerin 0.125% gel with 2% lidocaine apply rectally tid for 6-8 weeks 05/28/18  Yes Thornton Park, MD  aspirin 81 MG tablet Take 81 mg by mouth daily.  Yes [provider]  Calcium Carbonate (CALCIUM 600 PO) Take 1 tablet by mouth 2 (two) times daily.   Yes [provider]  cycloSPORINE (RESTASIS) 0.05 % ophthalmic emulsion 1 drop 2 (two) times daily.     Yes [provider]  estradiol (ESTRACE) 0.1 MG/GM vaginal cream Place 0.92 Applicatorfuls vaginally 2 (two) times a week. 02/24/15  Yes Roselee Culver, MD  fluticasone [facility]) 50 MCG/ACT nasal spray Place 2 sprays into both nostrils daily. Patient taking differently: Place 2 sprays into both nostrils as needed.  03/02/17  Yes Timmothy Euler, [place] D, PA-C  hydrochlorothiazide (HYDRODIURIL) 25 MG tablet TAKE 1 TABLET(25 MG) BY MOUTH DAILY 05/02/18  Yes Shawnee Knapp, MD  loratadine (CLARITIN) 10 MG tablet TAKE 1 TABLET(10 MG) BY MOUTH DAILY 05/02/18  Yes Shawnee Knapp, MD   metoprolol tartrate (LOPRESSOR) 25 MG tablet TAKE 1 TABLET BY MOUTH TWICE DAILY 05/29/18  Yes Shawnee Knapp, MD  polyethylene glycol (MIRALAX / GLYCOLAX) packet Take 17 g by mouth daily. Reported on 07/10/2015   Yes [provider]  PRESCRIPTION MEDICATION Allergy injection once a week.[facility]   Yes [provider]  Probiotic Product (ALIGN) 4 MG CAPS Take 1 capsule by mouth daily. 07/05/11  Yes Sable Feil, MD  Wheat Dextrin (BENEFIBER DRINK MIX PO) Take 10 mLs by mouth every morning. Take 2 teaspoons every morning     Yes [provider]  Linaclotide (LINZESS) 145 MCG CAPS Take 1 capsule by mouth daily. 09/05/11 10/03/11  Sable Feil, MD   Social History   Socioeconomic History  . Marital status: Married    Spouse name: Not on file  . Number of children: 2  . Years of education: some college  . Highest education level: Not on file  Occupational History  . Occupation: Retired  Scientific laboratory technician  . Financial resource strain: Not hard at all  . Food insecurity:    Worry: Never true    Inability: Never true  . Transportation needs:    Medical: No    Non-medical: No  Tobacco Use  . Smoking status: Never Smoker  . Smokeless tobacco: Never Used  Substance and Sexual Activity  . Alcohol use: No    Alcohol/week: 0.0 standard drinks  . Drug use: No  . Sexual activity: Yes    Birth control/protection: Post-menopausal    Comment: number of sex partners in the last 12 months  1  Lifestyle  . Physical activity:    Days per week: 7 days    Minutes per session: 20 min  . Stress: Rather much  Relationships  . Social connections:    Talks on phone: More than three times a week    Gets together: More than three times a week    Attends religious service: Patient refused    Active member of club or organization: No    Attends meetings of clubs or organizations: Never    Relationship status: Married  . Intimate partner violence:     Fear of current or ex partner: No    Emotionally abused: No    Physically abused: No    Forced sexual activity: No  Other Topics Concern  . Not on file  Social History Narrative   Patient gets regular exercise. Cares for her grandchildren.    Review of Systems Per hpi    Objective:   Physical Exam  Constitutional: She is oriented to person, place, and time. She appears well-developed  and well-nourished. No distress.  HENT:  Head: Normocephalic and atraumatic.  Cardiovascular: Normal rate.  Pulmonary/Chest: Effort normal.  Musculoskeletal:       Right shoulder: She exhibits normal range of motion, no tenderness and normal strength.       Left shoulder: She exhibits normal range of motion and normal strength.       Right wrist: She exhibits no tenderness and no bony tenderness.       Left wrist: She exhibits no tenderness and no bony tenderness.       Right hip: She exhibits normal range of motion, normal strength and no tenderness.       Left hip: She exhibits normal range of motion, normal strength and no tenderness.       Lumbar back: She exhibits tenderness. She exhibits no bony tenderness, no swelling and no deformity.       Back:  Neurological: She is alert and oriented to person, place, and time.  Psychiatric: She has a normal mood and affect.   Vitals:   06/11/18 1416 06/11/18 1418  BP: (!) 179/82 (!) 160/73  Pulse: 75   Resp: 14   Temp: 98.3 F (36.8 C)   TempSrc: Oral   SpO2: 100%   Weight: 140 lb (63.5 kg)   Height: 5\' 5"  (1.651 m)     Dg Pelvis 1-2 Views  Result Date: 06/11/2018 CLINICAL DATA:  Contusion of the right buttock. Ischial pain following a fall last night. EXAM: PELVIS - 1-2 VIEW COMPARISON:  Abdominal and pelvic CT scan of October 19, 2017 FINDINGS: The bony pelvis is subjectively adequately mineralized. No acute iliac or ischial fracture is observed. The pubic rami are intact. The sacrum and SI joints are grossly normal for age. There is mild  symmetric narrowing of the hip joint spaces greatest on the right. IMPRESSION: There is no acute bony abnormality of the pelvis. There is mild degenerative narrowing of the hip joints bilaterally. Electronically Signed   By: David  Martinique M.D.   On: 06/11/2018 15:47         Assessment & Plan:   Ruth Walker is a 72 y.o. female Fall in home, initial encounter - Plan: DG Pelvis 1-2 Views  Contusion of buttock, initial encounter - Plan: DG Pelvis 1-2 Views  Mechanical fall without other concerning preceding symptoms. Buttock contusion without sign of fracture on x-ray.  Has full range of motion and strength of upper extremities, no focal bony tenderness, imaging was deferred.  Symptomatic care was discussed with RTC precautions.  No orders of the defined types were placed in this encounter.  Patient Instructions   I do not see any bruising on exam today, but sometimes that can take a few days.  I will check an x-ray, but I do not think he broke any bones.  Soreness in other muscles should also improve with a little bit of time.  Tylenol if needed, occasional ibuprofen if Tylenol is not helpful.  Heat or ice to affected areas.  Shoulders, arms, wrist all look okay today. Return to the clinic or go to the nearest emergency room if any of your symptoms worsen or new symptoms occur.   Contusion A contusion is a deep bruise. Contusions are the result of a blunt injury to tissues and muscle fibers under the skin. The injury causes bleeding under the skin. The skin overlying the contusion may turn blue, purple, or yellow. Minor injuries will give you a painless contusion, but more severe  contusions may stay painful and swollen for a few weeks. What are the causes? This condition is usually caused by a blow, trauma, or direct force to an area of the body. What are the signs or symptoms? Symptoms of this condition include:  Swelling of the injured area.  Pain and tenderness in the injured  area.  Discoloration. The area may have redness and then turn blue, purple, or yellow.  How is this diagnosed? This condition is diagnosed based on a physical exam and medical history. An X-ray, CT scan, or MRI may be needed to determine if there are any associated injuries, such as broken bones (fractures). How is this treated? Specific treatment for this condition depends on what area of the body was injured. In general, the best treatment for a contusion is resting, icing, applying pressure to (compression), and elevating the injured area. This is often called the RICE strategy. Over-the-counter anti-inflammatory medicines may also be recommended for pain control. Follow these instructions at home:  Rest the injured area.  If directed, apply ice to the injured area: ? Put ice in a plastic bag. ? Place a towel between your skin and the bag. ? Leave the ice on for 20 minutes, 2-3 times per day.  If directed, apply light compression to the injured area using an elastic bandage. Make sure the bandage is not wrapped too tightly. Remove and reapply the bandage as directed by your health care provider.  If possible, raise (elevate) the injured area above the level of your heart while you are sitting or lying down.  Take over-the-counter and prescription medicines only as told by your health care provider. Contact a health care provider if:  Your symptoms do not improve after several days of treatment.  Your symptoms get worse.  You have difficulty moving the injured area. Get help right away if:  You have severe pain.  You have numbness in a hand or foot.  Your hand or foot turns pale or cold. This information is not intended to replace advice given to you by your health care provider. Make sure you discuss any questions you have with your health care provider. Document Released: 04/05/2005 Document Revised: 11/04/2015 Document Reviewed: 11/11/2014 Elsevier Interactive Patient  Education  [employer].   If you have lab work done today you will be contacted with your lab results within the next 2 weeks.  If you have not heard from [place] then please contact us. The fastest way to get your results is to register for My Chart.   IF you received an x-ray today, you will receive an invoice from [facility]. Please contact [facility] at [phone] with questions or concerns regarding your invoice.   IF you received labwork today, you will receive an invoice from [facility]. Please contact [facility] at [phone] with questions or concerns regarding your invoice.   Our billing staff will not be able to assist you with questions regarding bills from these companies.  You will be contacted with the lab results as soon as they are available. The fastest way to get your results is to activate your My Chart account. Instructions are located on the last page of this paperwork. If you have not heard from [place] regarding the results in 2 weeks, please contact this office.       Signed,   Merri Ray, MD [facility] [place].  06/12/18 9:52 AM

## 2018-06-28 ENCOUNTER — Ambulatory Visit
Admission: RE | Admit: 2018-06-28 | Discharge: 2018-06-28 | Disposition: A | Discharge status: Home / Self Care | Payer: Medicare Other | Source: Ambulatory Visit | Attending: Family Medicine | Admitting: Family Medicine

## 2018-06-28 DIAGNOSIS — Z1231 Encounter for screening mammogram for malignant neoplasm of breast: Secondary | ICD-10-CM

## 2018-07-31 ENCOUNTER — Other Ambulatory Visit: Payer: Self-pay | Admitting: Family Medicine

## 2018-07-31 DIAGNOSIS — I10 Essential (primary) hypertension: Secondary | ICD-10-CM

## 2018-07-31 NOTE — Telephone Encounter (Signed)
Requested Prescriptions  Pending Prescriptions Disp Refills  . hydrochlorothiazide (HYDRODIURIL) 25 MG tablet [Pharmacy Med Name: HYDROCHLOROTHIAZIDE 25MG  TABLETS] 90 tablet 0    Sig: TAKE 1 TABLET(25 MG) BY MOUTH DAILY     Cardiovascular: Diuretics - Thiazide Failed - 07/31/2018  6:40 AM      Failed - Ca in normal range and within 360 days    Calcium  Date Value Ref Range Status  10/11/2017 10.9 (H) 8.7 - 10.3 mg/dL Final         Failed - Last BP in normal range    BP Readings from Last 1 Encounters:  06/11/18 (!) 160/73         Passed - Cr in normal range and within 360 days    Creat  Date Value Ref Range Status  09/23/2014 0.70 0.50 - 1.10 mg/dL Final   Creatinine, Ser  Date Value Ref Range Status  10/11/2017 0.77 0.57 - 1.00 mg/dL Final         Passed - K in normal range and within 360 days    Potassium  Date Value Ref Range Status  10/11/2017 4.3 3.5 - 5.2 mmol/L Final         Passed - Na in normal range and within 360 days    Sodium  Date Value Ref Range Status  10/11/2017 140 134 - 144 mmol/L Final         Passed - Valid encounter within last 6 months    Recent Outpatient Visits          1 month ago Fall in home, initial encounter   [facility], Ranell Patrick, MD   6 months ago Sore throat   [facility], Lilia Argue, MD   7 months ago Seborrheic keratosis   [facility], Laurey Arrow, MD   8 months ago Cough   [facility], Audrie Lia, PA-C   9 months ago Annual physical exam   [facility], Laurey Arrow, MD

## 2018-08-20 ENCOUNTER — Other Ambulatory Visit: Payer: Self-pay | Admitting: Family Medicine

## 2018-09-05 ENCOUNTER — Other Ambulatory Visit: Payer: Self-pay | Admitting: Family Medicine

## 2018-09-05 DIAGNOSIS — F411 Generalized anxiety disorder: Secondary | ICD-10-CM

## 2018-09-05 NOTE — Telephone Encounter (Signed)
Requested medication (s) are due for refill today: yes  Requested medication (s) are on the active medication list: yes  Last refill:  03/08/18  Future visit scheduled: no  Notes to clinic:  Not delegated    Requested Prescriptions  Pending Prescriptions Disp Refills   ALPRAZolam (XANAX) 0.25 MG tablet [Pharmacy Med Name: ALPRAZOLAM 0.25MG  TABLETS] 60 tablet     Sig: TAKE 1 TABLET BY MOUTH TWICE DAILY AS NEEDED FOR ANXIETY     Not Delegated - Psychiatry:  Anxiolytics/Hypnotics Failed - 09/05/2018 12:58 PM      Failed - This refill cannot be delegated      Failed - Urine Drug Screen completed in last 360 days.      Passed - Valid encounter within last 6 months    Recent Outpatient Visits          2 months ago Fall in home, initial encounter   [facility], Ranell Patrick, MD   7 months ago Sore throat   [facility], Lilia Argue, MD   8 months ago Seborrheic keratosis   [facility], Laurey Arrow, MD   9 months ago Cough   [facility] [place], PA-C   10 months ago Annual physical exam   [facility], Laurey Arrow, MD

## 2018-09-09 ENCOUNTER — Other Ambulatory Visit: Payer: Self-pay | Admitting: Family Medicine

## 2018-09-09 DIAGNOSIS — F411 Generalized anxiety disorder: Secondary | ICD-10-CM

## 2018-09-09 NOTE — Telephone Encounter (Signed)
Copied from [place] [phone]. Topic: Quick Communication - Rx Refill/Question >> Sep 09, 2018 11:56 AM Blase Mess A wrote: Medication: ALPRAZolam Duanne Moron) 0.25 MG tablet [[id number]]   Has the patient contacted their pharmacy? Yes (Agent: If no, request that the patient contact the pharmacy for the refill.) (Agent: If yes, when and what did the pharmacy advise?)  Preferred Pharmacy (with phone number or street name): [facility] [place] [facility] [address] [phone] (Phone) [phone] (Fax)    Agent: Please be advised that RX refills may take up to 3 business days. We ask that you follow-up with your pharmacy.

## 2018-09-10 NOTE — Telephone Encounter (Signed)
Pt made an appt for 10/14/2018 with Dr. Mitchel Honour and would like to see if enough medication can be sent in to last until that appt. Please advise.

## 2018-09-10 NOTE — Telephone Encounter (Signed)
I talked with pt. I stated that we are unable to refill her medication until her next appointment ( 10/14/2018) with Dr. Mitchel Honour . I stated that we can see her this week for the medication refill appointment for Xanax. Pt is very up-set and states that she is going to find her another PCP after she get this medication refilled and that she's been trying to get this medication refilled for a month with Dr. Brigitte Pulse. Pt was transferred to the front desk to schedule a sooner  Appointment.

## 2018-09-16 ENCOUNTER — Other Ambulatory Visit: Payer: Self-pay

## 2018-09-16 ENCOUNTER — Telehealth: Payer: Self-pay | Admitting: Family Medicine

## 2018-09-16 ENCOUNTER — Encounter: Payer: Self-pay | Admitting: Emergency Medicine

## 2018-09-16 ENCOUNTER — Ambulatory Visit: Payer: Medicare Other | Admitting: Emergency Medicine

## 2018-09-16 DIAGNOSIS — I10 Essential (primary) hypertension: Secondary | ICD-10-CM

## 2018-09-16 DIAGNOSIS — J309 Allergic rhinitis, unspecified: Secondary | ICD-10-CM | POA: Diagnosis not present

## 2018-09-16 DIAGNOSIS — F411 Generalized anxiety disorder: Secondary | ICD-10-CM

## 2018-09-16 MED ORDER — ALPRAZOLAM 0.25 MG PO TABS: 0.2500 mg | ORAL_TABLET | Freq: Every day | ORAL | 0 refills | Status: DC | PRN

## 2018-09-16 MED ORDER — METOPROLOL TARTRATE 25 MG PO TABS: 25.0000 mg | ORAL_TABLET | Freq: Two times a day (BID) | ORAL | 3 refills | Status: DC

## 2018-09-16 MED ORDER — FLUTICASONE PROPIONATE 50 MCG/ACT NA SUSP: 2.0000 | NASAL | 3 refills | Status: DC | PRN

## 2018-09-16 MED ORDER — LORATADINE 10 MG PO TABS: 10.0000 mg | ORAL_TABLET | Freq: Every day | ORAL | 3 refills | Status: DC

## 2018-09-16 MED ORDER — HYDROCHLOROTHIAZIDE 25 MG PO TABS: 25.0000 mg | ORAL_TABLET | Freq: Every day | ORAL | 3 refills | Status: DC

## 2018-09-16 NOTE — Progress Notes (Signed)
[name] 73 y.o.   Chief Complaint  Patient presents with  . Medication Refill    all meds     HISTORY OF PRESENT ILLNESS: This is a 73 y.o. female here for medication refills. Has a history of hypertension, needs refill for Lopressor and hydrochlorothiazide. Has history of anxiety, needs refill for Xanax. Has a history of allergies, needs refill for Claritin and Flonase. No complaints or medical concerns at this time.  HPI   Prior to Admission medications   Medication Sig Start Date End Date Taking? Authorizing Provider  ALPRAZolam (XANAX) 0.25 MG tablet Take 1 tablet (0.25 mg total) by mouth daily as needed. for anxiety 09/16/18  Yes [place], [place], MD  AMBULATORY NON FORMULARY MEDICATION Medication Name: Nitroglycerin 0.125% gel with 2% lidocaine apply rectally tid for 6-8 weeks 05/28/18  Yes [place], MD  aspirin 81 MG tablet Take 81 mg by mouth daily.     Yes [provider]  Calcium Carbonate (CALCIUM 600 PO) Take 1 tablet by mouth 2 (two) times daily.   Yes [provider]  cycloSPORINE (RESTASIS) 0.05 % ophthalmic emulsion 1 drop 2 (two) times daily.     Yes [provider]  estradiol (ESTRACE) 0.1 MG/GM vaginal cream Place 5.00 Applicatorfuls vaginally 2 (two) times a week. 02/24/15  Yes Roselee Culver, MD  fluticasone [facility]) 50 MCG/ACT nasal spray Place 2 sprays into both nostrils as needed. 09/16/18  Yes Sagardia, Ines Bloomer, MD  hydrochlorothiazide (HYDRODIURIL) 25 MG tablet Take 1 tablet (25 mg total) by mouth daily. 09/16/18 12/15/18 Yes Sagardia, Ines Bloomer, MD  loratadine (CLARITIN) 10 MG tablet Take 1 tablet (10 mg total) by mouth daily. 09/16/18 12/15/18 Yes Sagardia, Ines Bloomer, MD  metoprolol tartrate (LOPRESSOR) 25 MG tablet Take 1 tablet (25 mg total) by mouth 2 (two) times daily. 09/16/18 12/15/18 Yes Sagardia, Ines Bloomer, MD  polyethylene glycol [facility] / GLYCOLAX) packet Take 17 g by mouth daily. Reported on  07/10/2015   Yes [provider]  PRESCRIPTION MEDICATION Allergy injection once a week.[facility]   Yes [provider]  Probiotic Product (ALIGN) 4 MG CAPS Take 1 capsule by mouth daily. 07/05/11  Yes Sable Feil, MD  Wheat Dextrin (BENEFIBER DRINK MIX PO) Take 10 mLs by mouth every morning. Take 2 teaspoons every morning     Yes [provider]  Linaclotide (LINZESS) 145 MCG CAPS Take 1 capsule by mouth daily. 09/05/11 10/03/11  Sable Feil, MD    Allergies  Allergen Reactions  . Aloe   . Benadryl [Diphenhydramine Hcl] Other (See Comments)    Makes her feel "hyper"  . Cephalexin     REACTION: coarse rash  . Ciprofloxacin   . Mucinex [Guaifenesin Er] Other (See Comments)    Makes her feel worse  . Shellfish Allergy Hives  . Sudafed [Pseudoephedrine Hcl] Hives and Other (See Comments)    Itching and hives asthma  . Sulfonamide Derivatives     Patient Active Problem List   Diagnosis Date Noted  . Allergic rhinitis 11/14/2016  . External hemorrhoids 03/04/2012  . DVT (deep venous thrombosis) ([place])   . Uterine polyp   . Osteoporosis   . Vertebral compression fracture ([place])   . IBS (irritable bowel syndrome) 09/05/2011  . Constipation, slow transit 09/05/2011  . Anxiety disorder 09/05/2011  . Diverticulosis 11/04/2010  . Gallstones 11/04/2010  . ANAL FISSURE 05/26/2008  . OBSESSIVE-COMPULSIVE PERSONALITY DISORDER 02/28/2008  . Essential hypertension 02/28/2008  .  Hemorrhoids 02/28/2008  . Asthma 02/28/2008  . CYSTITIS, CHRONIC 02/28/2008    Past Medical History:  Diagnosis Date  . Allergy   . Anal fissure   . Anxiety states   . Asthma    Gets allergy shots once a week   . Cholelithiasis   . Depression   . Diverticulosis   . Environmental allergies   . Hemorrhoids   . Irritable bowel syndrome (IBS)   . Osteoporosis   . Skin cancer    Leg  . Uterine polyp   . Vertebral compression fracture [facility])      Past Surgical History:  Procedure Laterality Date  . BACK SURGERY    . CESAREAN SECTION    . DILATION AND CURETTAGE OF UTERUS    . Endometrial polypectomy    . FRACTURE SURGERY     neck (vertebrae)  . HEMORRHOID SURGERY    . HYSTEROSCOPY    . IBS    . MOUTH SURGERY    . SKIN CANCER EXCISION     Leg     Social History   Socioeconomic History  . Marital status: Married    Spouse name: Not on file  . Number of children: 2  . Years of education: some college  . Highest education level: Not on file  Occupational History  . Occupation: Retired  Scientific laboratory technician  . Financial resource strain: Not hard at all  . Food insecurity:    Worry: Never true    Inability: Never true  . Transportation needs:    Medical: No    Non-medical: No  Tobacco Use  . Smoking status: Never Smoker  . Smokeless tobacco: Never Used  Substance and Sexual Activity  . Alcohol use: No    Alcohol/week: 0.0 standard drinks  . Drug use: No  . Sexual activity: Yes    Birth control/protection: Post-menopausal    Comment: number of sex partners in the last 12 months  1  Lifestyle  . Physical activity:    Days per week: 7 days    Minutes per session: 20 min  . Stress: Rather much  Relationships  . Social connections:    Talks on phone: More than three times a week    Gets together: More than three times a week    Attends religious service: Patient refused    Active member of club or organization: No    Attends meetings of clubs or organizations: Never    Relationship status: Married  . Intimate partner violence:    Fear of current or ex partner: No    Emotionally abused: No    Physically abused: No    Forced sexual activity: No  Other Topics Concern  . Not on file  Social History Narrative   Patient gets regular exercise. Cares for her grandchildren.    Family History  Problem Relation Age of Onset  . Diabetes Mother   . Hypertension Mother   . Thyroid disease Mother   . Hypertension  Father   . Heart disease Father   . Thyroid disease Sister   . Colon cancer Neg Hx      Review of Systems  Constitutional: Negative.  Negative for chills and fever.  HENT: Negative for sore throat.   Respiratory: Negative for shortness of breath.   Cardiovascular: Negative for chest pain and palpitations.  Gastrointestinal: Negative for abdominal pain, nausea and vomiting.  Musculoskeletal: Positive for back pain.  Neurological: Negative for dizziness and headaches.  Endo/Heme/Allergies: Positive for environmental  allergies.   Vitals:   09/16/18 1057  BP: (!) 149/77  Pulse: 82  Resp: 18  Temp: 98.5 F (36.9 C)  SpO2: 100%     Physical Exam Vitals signs reviewed.  Constitutional:      Appearance: Normal appearance.  HENT:     Head: Normocephalic and atraumatic.     Nose: Nose normal.     Mouth/Throat:     Mouth: Mucous membranes are moist.     Pharynx: Oropharynx is clear.  Eyes:     Extraocular Movements: Extraocular movements intact.     Conjunctiva/sclera: Conjunctivae normal.     Pupils: Pupils are equal, round, and reactive to light.  Neck:     Musculoskeletal: Normal range of motion and neck supple.  Cardiovascular:     Rate and Rhythm: Normal rate.     Pulses: Normal pulses.     Heart sounds: Normal heart sounds.  Pulmonary:     Effort: Pulmonary effort is normal.     Breath sounds: Normal breath sounds.  Musculoskeletal: Normal range of motion.  Skin:    General: Skin is warm and dry.     Capillary Refill: Capillary refill takes less than 2 seconds.  Neurological:     General: No focal deficit present.     Mental Status: She is alert and oriented to person, place, and time.  Psychiatric:        Mood and Affect: Mood normal.        Behavior: Behavior normal.      ASSESSMENT & PLAN: Rajanae was seen today for medication refill.  Diagnoses and all orders for this visit:  Generalized anxiety disorder -     ALPRAZolam (XANAX) 0.25 MG tablet;  Take 1 tablet (0.25 mg total) by mouth daily as needed. for anxiety  Essential hypertension -     hydrochlorothiazide (HYDRODIURIL) 25 MG tablet; Take 1 tablet (25 mg total) by mouth daily.  Allergic rhinitis, unspecified seasonality, unspecified trigger -     fluticasone (FLONASE) 50 MCG/ACT nasal spray; Place 2 sprays into both nostrils as needed.  Other orders -     loratadine (CLARITIN) 10 MG tablet; Take 1 tablet (10 mg total) by mouth daily. -     metoprolol tartrate (LOPRESSOR) 25 MG tablet; Take 1 tablet (25 mg total) by mouth 2 (two) times daily.   Patient Instructions       If you have lab work done today you will be contacted with your lab results within the next 2 weeks.  If you have not heard from [place] then please contact us. The fastest way to get your results is to register for My Chart.   IF you received an x-ray today, you will receive an invoice from [facility]. Please contact [facility] at [phone] with questions or concerns regarding your invoice.   IF you received labwork today, you will receive an invoice from [facility]. Please contact [facility] at [phone] with questions or concerns regarding your invoice.   Our billing staff will not be able to assist you with questions regarding bills from these companies.  You will be contacted with the lab results as soon as they are available. The fastest way to get your results is to activate your My Chart account. Instructions are located on the last page of this paperwork. If you have not heard from [place] regarding the results in 2 weeks, please contact this office.    Health Maintenance, Female Adopting a healthy lifestyle and getting preventive  care can go a long way to promote health and wellness. Talk with your health care provider about what schedule of regular examinations is right for you. This is a good chance for you to check in with your provider about disease prevention and staying  healthy. In between checkups, there are plenty of things you can do on your own. Experts have done a lot of research about which lifestyle changes and preventive measures are most likely to keep you healthy. Ask your health care provider for more information. Weight and diet Eat a healthy diet  Be sure to include plenty of vegetables, fruits, low-fat dairy products, and lean protein.  Do not eat a lot of foods high in solid fats, added sugars, or salt.  Get regular exercise. This is one of the most important things you can do for your health. ? Most adults should exercise for at least 150 minutes each week. The exercise should increase your heart rate and make you sweat (moderate-intensity exercise). ? Most adults should also do strengthening exercises at least twice a week. This is in addition to the moderate-intensity exercise. Maintain a healthy weight  Body mass index (BMI) is a measurement that can be used to identify possible weight problems. It estimates body fat based on height and weight. Your health care provider can help determine your BMI and help you achieve or maintain a healthy weight.  For females 51 years of age and older: ? A BMI below 18.5 is considered underweight. ? A BMI of 18.5 to 24.9 is normal. ? A BMI of 25 to 29.9 is considered overweight. ? A BMI of 30 and above is considered obese. Watch levels of cholesterol and blood lipids  You should start having your blood tested for lipids and cholesterol at 72 years of age, then have this test every 5 years.  You may need to have your cholesterol levels checked more often if: ? Your lipid or cholesterol levels are high. ? You are older than 72 years of age. ? You are at high risk for heart disease. Cancer screening Lung Cancer  Lung cancer screening is recommended for adults 70-72 years old who are at high risk for lung cancer because of a history of smoking.  A yearly low-dose CT scan of the lungs is recommended  for people who: ? Currently smoke. ? Have quit within the past 15 years. ? Have at least a 30-pack-year history of smoking. A pack year is smoking an average of one pack of cigarettes a day for 1 year.  Yearly screening should continue until it has been 15 years since you quit.  Yearly screening should stop if you develop a health problem that would prevent you from having lung cancer treatment. Breast Cancer  Practice breast self-awareness. This means understanding how your breasts normally appear and feel.  It also means doing regular breast self-exams. Let your health care provider know about any changes, no matter how small.  If you are in your 20s or 30s, you should have a clinical breast exam (CBE) by a health care provider every 1-3 years as part of a regular health exam.  If you are 28 or older, have a CBE every year. Also consider having a breast X-ray (mammogram) every year.  If you have a family history of breast cancer, talk to your health care provider about genetic screening.  If you are at high risk for breast cancer, talk to your health care provider about having an MRI  and a mammogram every year.  Breast cancer gene (BRCA) assessment is recommended for women who have family members with BRCA-related cancers. BRCA-related cancers include: ? Breast. ? Ovarian. ? Tubal. ? Peritoneal cancers.  Results of the assessment will determine the need for genetic counseling and BRCA1 and BRCA2 testing. Cervical Cancer Your health care provider may recommend that you be screened regularly for cancer of the pelvic organs (ovaries, uterus, and vagina). This screening involves a pelvic examination, including checking for microscopic changes to the surface of your cervix (Pap test). You may be encouraged to have this screening done every 3 years, beginning at age 72.  For women ages 49-65, health care providers may recommend pelvic exams and Pap testing every 3 years, or they may  recommend the Pap and pelvic exam, combined with testing for human papilloma virus (HPV), every 5 years. Some types of HPV increase your risk of cervical cancer. Testing for HPV may also be done on women of any age with unclear Pap test results.  Other health care providers may not recommend any screening for nonpregnant women who are considered low risk for pelvic cancer and who do not have symptoms. Ask your health care provider if a screening pelvic exam is right for you.  If you have had past treatment for cervical cancer or a condition that could lead to cancer, you need Pap tests and screening for cancer for at least 20 years after your treatment. If Pap tests have been discontinued, your risk factors (such as having a new sexual partner) need to be reassessed to determine if screening should resume. Some women have medical problems that increase the chance of getting cervical cancer. In these cases, your health care provider may recommend more frequent screening and Pap tests. Colorectal Cancer  This type of cancer can be detected and often prevented.  Routine colorectal cancer screening usually begins at 72 years of age and continues through 72 years of age.  Your health care provider may recommend screening at an earlier age if you have risk factors for colon cancer.  Your health care provider may also recommend using home test kits to check for hidden blood in the stool.  A small camera at the end of a tube can be used to examine your colon directly (sigmoidoscopy or colonoscopy). This is done to check for the earliest forms of colorectal cancer.  Routine screening usually begins at age 71.  Direct examination of the colon should be repeated every 5-10 years through 71 years of age. However, you may need to be screened more often if early forms of precancerous polyps or small growths are found. Skin Cancer  Check your skin from head to toe regularly.  Tell your health care provider  about any new moles or changes in moles, especially if there is a change in a mole's shape or color.  Also tell your health care provider if you have a mole that is larger than the size of a pencil eraser.  Always use sunscreen. Apply sunscreen liberally and repeatedly throughout the day.  Protect yourself by wearing long sleeves, pants, a wide-brimmed hat, and sunglasses whenever you are outside. Heart disease, diabetes, and high blood pressure  High blood pressure causes heart disease and increases the risk of stroke. High blood pressure is more likely to develop in: ? People who have blood pressure in the high end of the normal range (130-139/85-89 mm Hg). ? People who are overweight or obese. ? People who are  African American.  If you are 36-44 years of age, have your blood pressure checked every 3-5 years. If you are 63 years of age or older, have your blood pressure checked every year. You should have your blood pressure measured twice-once when you are at a hospital or clinic, and once when you are not at a hospital or clinic. Record the average of the two measurements. To check your blood pressure when you are not at a hospital or clinic, you can use: ? An automated blood pressure machine at a pharmacy. ? A home blood pressure monitor.  If you are between 50 years and 55 years old, ask your health care provider if you should take aspirin to prevent strokes.  Have regular diabetes screenings. This involves taking a blood sample to check your fasting blood sugar level. ? If you are at a normal weight and have a low risk for diabetes, have this test once every three years after 72 years of age. ? If you are overweight and have a high risk for diabetes, consider being tested at a younger age or more often. Preventing infection Hepatitis B  If you have a higher risk for hepatitis B, you should be screened for this virus. You are considered at high risk for hepatitis B if: ? You were  born in a country where hepatitis B is common. Ask your health care provider which countries are considered high risk. ? Your parents were born in a high-risk country, and you have not been immunized against hepatitis B (hepatitis B vaccine). ? You have HIV or AIDS. ? You use needles to inject street drugs. ? You live with someone who has hepatitis B. ? You have had sex with someone who has hepatitis B. ? You get hemodialysis treatment. ? You take certain medicines for conditions, including cancer, organ transplantation, and autoimmune conditions. Hepatitis C  Blood testing is recommended for: ? Everyone born from [birth year] through 1964. ? Anyone with known risk factors for hepatitis C. Sexually transmitted infections (STIs)  You should be screened for sexually transmitted infections (STIs) including gonorrhea and chlamydia if: ? You are sexually active and are younger than 72 years of age. ? You are older than 72 years of age and your health care provider tells you that you are at risk for this type of infection. ? Your sexual activity has changed since you were last screened and you are at an increased risk for chlamydia or gonorrhea. Ask your health care provider if you are at risk.  If you do not have HIV, but are at risk, it may be recommended that you take a prescription medicine daily to prevent HIV infection. This is called pre-exposure prophylaxis (PrEP). You are considered at risk if: ? You are sexually active and do not regularly use condoms or know the HIV status of your partner(s). ? You take drugs by injection. ? You are sexually active with a partner who has HIV. Talk with your health care provider about whether you are at high risk of being infected with HIV. If you choose to begin PrEP, you should first be tested for HIV. You should then be tested every 3 months for as long as you are taking PrEP. Pregnancy  If you are premenopausal and you may become pregnant, ask your health  care provider about preconception counseling.  If you may become pregnant, take 400 to 800 micrograms (mcg) of folic acid every day.  If you want to prevent pregnancy, talk  to your health care provider about birth control (contraception). Osteoporosis and menopause  Osteoporosis is a disease in which the bones lose minerals and strength with aging. This can result in serious bone fractures. Your risk for osteoporosis can be identified using a bone density scan.  If you are 14 years of age or older, or if you are at risk for osteoporosis and fractures, ask your health care provider if you should be screened.  Ask your health care provider whether you should take a calcium or vitamin D supplement to lower your risk for osteoporosis.  Menopause may have certain physical symptoms and risks.  Hormone replacement therapy may reduce some of these symptoms and risks. Talk to your health care provider about whether hormone replacement therapy is right for you. Follow these instructions at home:  Schedule regular health, dental, and eye exams.  Stay current with your immunizations.  Do not use any tobacco products including cigarettes, chewing tobacco, or electronic cigarettes.  If you are pregnant, do not drink alcohol.  If you are breastfeeding, limit how much and how often you drink alcohol.  Limit alcohol intake to no more than 1 drink per day for nonpregnant women. One drink equals 12 ounces of beer, 5 ounces of wine, or 1 ounces of hard liquor.  Do not use street drugs.  Do not share needles.  Ask your health care provider for help if you need support or information about quitting drugs.  Tell your health care provider if you often feel depressed.  Tell your health care provider if you have ever been abused or do not feel safe at home. This information is not intended to replace advice given to you by your health care provider. Make sure you discuss any questions you have with  your health care provider. Document Released: 01/09/2011 Document Revised: 12/02/2015 Document Reviewed: 03/30/2015 Elsevier Interactive Patient Education  2019 [employer]      Agustina Caroli, MD [facility]

## 2018-09-16 NOTE — Patient Instructions (Addendum)
If you have lab work done today you will be contacted with your lab results within the next 2 weeks.  If you have not heard from [place] then please contact us. The fastest way to get your results is to register for My Chart.   IF you received an x-ray today, you will receive an invoice from [facility]. Please contact [facility] at [phone] with questions or concerns regarding your invoice.   IF you received labwork today, you will receive an invoice from [place]. Please contact [facility] at [phone] with questions or concerns regarding your invoice.   Our billing staff will not be able to assist you with questions regarding bills from these companies.  You will be contacted with the lab results as soon as they are available. The fastest way to get your results is to activate your My Chart account. Instructions are located on the last page of this paperwork. If you have not heard from [place] regarding the results in 2 weeks, please contact this office.    Health Maintenance, Female Adopting a healthy lifestyle and getting preventive care can go a long way to promote health and wellness. Talk with your health care provider about what schedule of regular examinations is right for you. This is a good chance for you to check in with your provider about disease prevention and staying healthy. In between checkups, there are plenty of things you can do on your own. Experts have done a lot of research about which lifestyle changes and preventive measures are most likely to keep you healthy. Ask your health care provider for more information. Weight and diet Eat a healthy diet  Be sure to include plenty of vegetables, fruits, low-fat dairy products, and lean protein.  Do not eat a lot of foods high in solid fats, added sugars, or salt.  Get regular exercise. This is one of the most important things you can do for your health. ? Most adults should exercise for at least 150  minutes each week. The exercise should increase your heart rate and make you sweat (moderate-intensity exercise). ? Most adults should also do strengthening exercises at least twice a week. This is in addition to the moderate-intensity exercise. Maintain a healthy weight  Body mass index (BMI) is a measurement that can be used to identify possible weight problems. It estimates body fat based on height and weight. Your health care provider can help determine your BMI and help you achieve or maintain a healthy weight.  For females 12 years of age and older: ? A BMI below 18.5 is considered underweight. ? A BMI of 18.5 to 24.9 is normal. ? A BMI of 25 to 29.9 is considered overweight. ? A BMI of 30 and above is considered obese. Watch levels of cholesterol and blood lipids  You should start having your blood tested for lipids and cholesterol at 72 years of age, then have this test every 5 years.  You may need to have your cholesterol levels checked more often if: ? Your lipid or cholesterol levels are high. ? You are older than 72 years of age. ? You are at high risk for heart disease. Cancer screening Lung Cancer  Lung cancer screening is recommended for adults 58-36 years old who are at high risk for lung cancer because of a history of smoking.  A yearly low-dose CT scan of the lungs is recommended for people who: ? Currently smoke. ? Have quit within the past 15  years. ? Have at least a 30-pack-year history of smoking. A pack year is smoking an average of one pack of cigarettes a day for 1 year.  Yearly screening should continue until it has been 15 years since you quit.  Yearly screening should stop if you develop a health problem that would prevent you from having lung cancer treatment. Breast Cancer  Practice breast self-awareness. This means understanding how your breasts normally appear and feel.  It also means doing regular breast self-exams. Let your health care provider  know about any changes, no matter how small.  If you are in your 20s or 30s, you should have a clinical breast exam (CBE) by a health care provider every 1-3 years as part of a regular health exam.  If you are 3 or older, have a CBE every year. Also consider having a breast X-ray (mammogram) every year.  If you have a family history of breast cancer, talk to your health care provider about genetic screening.  If you are at high risk for breast cancer, talk to your health care provider about having an MRI and a mammogram every year.  Breast cancer gene (BRCA) assessment is recommended for women who have family members with BRCA-related cancers. BRCA-related cancers include: ? Breast. ? Ovarian. ? Tubal. ? Peritoneal cancers.  Results of the assessment will determine the need for genetic counseling and BRCA1 and BRCA2 testing. Cervical Cancer Your health care provider may recommend that you be screened regularly for cancer of the pelvic organs (ovaries, uterus, and vagina). This screening involves a pelvic examination, including checking for microscopic changes to the surface of your cervix (Pap test). You may be encouraged to have this screening done every 3 years, beginning at age 63.  For women ages 71-65, health care providers may recommend pelvic exams and Pap testing every 3 years, or they may recommend the Pap and pelvic exam, combined with testing for human papilloma virus (HPV), every 5 years. Some types of HPV increase your risk of cervical cancer. Testing for HPV may also be done on women of any age with unclear Pap test results.  Other health care providers may not recommend any screening for nonpregnant women who are considered low risk for pelvic cancer and who do not have symptoms. Ask your health care provider if a screening pelvic exam is right for you.  If you have had past treatment for cervical cancer or a condition that could lead to cancer, you need Pap tests and  screening for cancer for at least 20 years after your treatment. If Pap tests have been discontinued, your risk factors (such as having a new sexual partner) need to be reassessed to determine if screening should resume. Some women have medical problems that increase the chance of getting cervical cancer. In these cases, your health care provider may recommend more frequent screening and Pap tests. Colorectal Cancer  This type of cancer can be detected and often prevented.  Routine colorectal cancer screening usually begins at 72 years of age and continues through 72 years of age.  Your health care provider may recommend screening at an earlier age if you have risk factors for colon cancer.  Your health care provider may also recommend using home test kits to check for hidden blood in the stool.  A small camera at the end of a tube can be used to examine your colon directly (sigmoidoscopy or colonoscopy). This is done to check for the earliest forms of colorectal cancer.  Routine screening usually begins at age 50.  Direct examination of the colon should be repeated every 5-10 years through 72 years of age. However, you may need to be screened more often if early forms of precancerous polyps or small growths are found. Skin Cancer  Check your skin from head to toe regularly.  Tell your health care provider about any new moles or changes in moles, especially if there is a change in a mole's shape or color.  Also tell your health care provider if you have a mole that is larger than the size of a pencil eraser.  Always use sunscreen. Apply sunscreen liberally and repeatedly throughout the day.  Protect yourself by wearing long sleeves, pants, a wide-brimmed hat, and sunglasses whenever you are outside. Heart disease, diabetes, and high blood pressure  High blood pressure causes heart disease and increases the risk of stroke. High blood pressure is more likely to develop in: ? People who  have blood pressure in the high end of the normal range (130-139/85-89 mm Hg). ? People who are overweight or obese. ? People who are African American.  If you are 18-39 years of age, have your blood pressure checked every 3-5 years. If you are 40 years of age or older, have your blood pressure checked every year. You should have your blood pressure measured twice-once when you are at a hospital or clinic, and once when you are not at a hospital or clinic. Record the average of the two measurements. To check your blood pressure when you are not at a hospital or clinic, you can use: ? An automated blood pressure machine at a pharmacy. ? A home blood pressure monitor.  If you are between 55 years and 79 years old, ask your health care provider if you should take aspirin to prevent strokes.  Have regular diabetes screenings. This involves taking a blood sample to check your fasting blood sugar level. ? If you are at a normal weight and have a low risk for diabetes, have this test once every three years after 72 years of age. ? If you are overweight and have a high risk for diabetes, consider being tested at a younger age or more often. Preventing infection Hepatitis B  If you have a higher risk for hepatitis B, you should be screened for this virus. You are considered at high risk for hepatitis B if: ? You were born in a country where hepatitis B is common. Ask your health care provider which countries are considered high risk. ? Your parents were born in a high-risk country, and you have not been immunized against hepatitis B (hepatitis B vaccine). ? You have HIV or AIDS. ? You use needles to inject street drugs. ? You live with someone who has hepatitis B. ? You have had sex with someone who has hepatitis B. ? You get hemodialysis treatment. ? You take certain medicines for conditions, including cancer, organ transplantation, and autoimmune conditions. Hepatitis C  Blood testing is  recommended for: ? Everyone born from 1945 through 1965. ? Anyone with known risk factors for hepatitis C. Sexually transmitted infections (STIs)  You should be screened for sexually transmitted infections (STIs) including gonorrhea and chlamydia if: ? You are sexually active and are younger than 72 years of age. ? You are older than 72 years of age and your health care provider tells you that you are at risk for this type of infection. ? Your sexual activity has changed since you   were last screened and you are at an increased risk for chlamydia or gonorrhea. Ask your health care provider if you are at risk.  If you do not have HIV, but are at risk, it may be recommended that you take a prescription medicine daily to prevent HIV infection. This is called pre-exposure prophylaxis (PrEP). You are considered at risk if: ? You are sexually active and do not regularly use condoms or know the HIV status of your partner(s). ? You take drugs by injection. ? You are sexually active with a partner who has HIV. Talk with your health care provider about whether you are at high risk of being infected with HIV. If you choose to begin PrEP, you should first be tested for HIV. You should then be tested every 3 months for as long as you are taking PrEP. Pregnancy  If you are premenopausal and you may become pregnant, ask your health care provider about preconception counseling.  If you may become pregnant, take 400 to 800 micrograms (mcg) of folic acid every day.  If you want to prevent pregnancy, talk to your health care provider about birth control (contraception). Osteoporosis and menopause  Osteoporosis is a disease in which the bones lose minerals and strength with aging. This can result in serious bone fractures. Your risk for osteoporosis can be identified using a bone density scan.  If you are 65 years of age or older, or if you are at risk for osteoporosis and fractures, ask your health care  provider if you should be screened.  Ask your health care provider whether you should take a calcium or vitamin D supplement to lower your risk for osteoporosis.  Menopause may have certain physical symptoms and risks.  Hormone replacement therapy may reduce some of these symptoms and risks. Talk to your health care provider about whether hormone replacement therapy is right for you. Follow these instructions at home:  Schedule regular health, dental, and eye exams.  Stay current with your immunizations.  Do not use any tobacco products including cigarettes, chewing tobacco, or electronic cigarettes.  If you are pregnant, do not drink alcohol.  If you are breastfeeding, limit how much and how often you drink alcohol.  Limit alcohol intake to no more than 1 drink per day for nonpregnant women. One drink equals 12 ounces of beer, 5 ounces of wine, or 1 ounces of hard liquor.  Do not use street drugs.  Do not share needles.  Ask your health care provider for help if you need support or information about quitting drugs.  Tell your health care provider if you often feel depressed.  Tell your health care provider if you have ever been abused or do not feel safe at home. This information is not intended to replace advice given to you by your health care provider. Make sure you discuss any questions you have with your health care provider. Document Released: 01/09/2011 Document Revised: 12/02/2015 Document Reviewed: 03/30/2015 Elsevier Interactive Patient Education  2019 Elsevier Inc.  

## 2018-10-09 ENCOUNTER — Other Ambulatory Visit: Payer: Self-pay

## 2018-10-09 DIAGNOSIS — R7303 Prediabetes: Secondary | ICD-10-CM

## 2018-10-09 DIAGNOSIS — I10 Essential (primary) hypertension: Secondary | ICD-10-CM

## 2018-10-09 DIAGNOSIS — F411 Generalized anxiety disorder: Secondary | ICD-10-CM

## 2018-10-14 ENCOUNTER — Other Ambulatory Visit: Payer: Self-pay

## 2018-10-14 ENCOUNTER — Ambulatory Visit (INDEPENDENT_AMBULATORY_CARE_PROVIDER_SITE_OTHER): Payer: Medicare Other | Admitting: Emergency Medicine

## 2018-10-14 ENCOUNTER — Encounter: Payer: Self-pay | Admitting: Emergency Medicine

## 2018-10-14 DIAGNOSIS — J309 Allergic rhinitis, unspecified: Secondary | ICD-10-CM | POA: Diagnosis not present

## 2018-10-14 DIAGNOSIS — I10 Essential (primary) hypertension: Secondary | ICD-10-CM | POA: Diagnosis not present

## 2018-10-14 DIAGNOSIS — F411 Generalized anxiety disorder: Secondary | ICD-10-CM | POA: Diagnosis not present

## 2018-10-14 MED ORDER — ALPRAZOLAM 0.25 MG PO TABS: 0.2500 mg | ORAL_TABLET | Freq: Every day | ORAL | 0 refills | Status: DC | PRN

## 2018-10-14 MED ORDER — LORATADINE 10 MG PO TABS: 10.0000 mg | ORAL_TABLET | Freq: Every day | ORAL | 3 refills | Status: DC

## 2018-10-14 MED ORDER — HYDROCHLOROTHIAZIDE 25 MG PO TABS: 25.0000 mg | ORAL_TABLET | Freq: Every day | ORAL | 3 refills | Status: DC

## 2018-10-14 MED ORDER — METOPROLOL TARTRATE 25 MG PO TABS: 25.0000 mg | ORAL_TABLET | Freq: Two times a day (BID) | ORAL | 3 refills | Status: DC

## 2018-10-14 NOTE — Progress Notes (Signed)
Telemedicine Encounter- SOAP NOTE Established Patient  This telephone encounter was conducted with the patient's (or proxy's) verbal consent via audio telecommunications: yes/no: Yes Patient was instructed to have this encounter in a suitably private space; and to only have persons present to whom they give permission to participate. In addition, patient identity was confirmed by use of name plus two identifiers (DOB and address).  I discussed the limitations, risks, security and privacy concerns of performing an evaluation and management service by telephone and the availability of in person appointments. I also discussed with the patient that there may be a patient responsible charge related to this service. The patient expressed understanding and agreed to proceed.  I spent a total of TIME; 0 MIN TO 60 MIN: 15 minutes talking with the patient or their proxy.  Chief Complaint  Patient presents with  . Establish Care    Need refill on mediation     Subjective   Ruth Walker is a 73 y.o. female established patient. Telephone visit today for medication refills. No complaints or medical concerns today.  HPI   Patient Active Problem List   Diagnosis Date Noted  . Allergic rhinitis 11/14/2016  . External hemorrhoids 03/04/2012  . DVT (deep venous thrombosis) ([place])   . Uterine polyp   . Osteoporosis   . Vertebral compression fracture ([place])   . IBS (irritable bowel syndrome) 09/05/2011  . Constipation, slow transit 09/05/2011  . Anxiety disorder 09/05/2011  . Diverticulosis 11/04/2010  . Gallstones 11/04/2010  . ANAL FISSURE 05/26/2008  . OBSESSIVE-COMPULSIVE PERSONALITY DISORDER 02/28/2008  . Essential hypertension 02/28/2008  . Hemorrhoids 02/28/2008  . Asthma 02/28/2008  . CYSTITIS, CHRONIC 02/28/2008    Past Medical History:  Diagnosis Date  . Allergy   . Anal fissure   . Anxiety states   . Asthma    Gets allergy shots once a week   . Cholelithiasis   .  Depression   . Diverticulosis   . Environmental allergies   . Hemorrhoids   . Irritable bowel syndrome (IBS)   . Osteoporosis   . Skin cancer    Leg  . Uterine polyp   . Vertebral compression fracture ([facility])     Current Outpatient Medications  Medication Sig Dispense Refill  . AMBULATORY NON FORMULARY MEDICATION Medication Name: Nitroglycerin 0.125% gel with 2% lidocaine apply rectally tid for 6-8 weeks 1 Tube 1  . aspirin 81 MG tablet Take 81 mg by mouth daily.      . Calcium Carbonate (CALCIUM 600 PO) Take 1 tablet by mouth 2 (two) times daily.    . cycloSPORINE (RESTASIS) 0.05 % ophthalmic emulsion 1 drop 2 (two) times daily.      Marland Kitchen estradiol (ESTRACE) 0.1 MG/GM vaginal cream Place 6.27 Applicatorfuls vaginally 2 (two) times a week. 42.5 g 8  . fluticasone (FLONASE) 50 MCG/ACT nasal spray Place 2 sprays into both nostrils as needed. 16 g 3  . polyethylene glycol (MIRALAX / GLYCOLAX) packet Take 17 g by mouth daily. Reported on 07/10/2015    . PRESCRIPTION MEDICATION Allergy injection once a week.[facility] [employer]    . Probiotic Product (ALIGN) 4 MG CAPS Take 1 capsule by mouth daily. 21 capsule 0  . Wheat Dextrin (BENEFIBER DRINK MIX PO) Take 10 mLs by mouth every morning. Take 2 teaspoons every morning      . ALPRAZolam (XANAX) 0.25 MG tablet Take 1 tablet (0.25 mg total) by mouth daily as needed. for anxiety 60 tablet 0  .  hydrochlorothiazide (HYDRODIURIL) 25 MG tablet Take 1 tablet (25 mg total) by mouth daily. 90 tablet 3  . loratadine (CLARITIN) 10 MG tablet Take 1 tablet (10 mg total) by mouth daily. 90 tablet 3  . metoprolol tartrate (LOPRESSOR) 25 MG tablet Take 1 tablet (25 mg total) by mouth 2 (two) times daily. 180 tablet 3   No current facility-administered medications for this visit.     Allergies  Allergen Reactions  . Aloe   . Benadryl [Diphenhydramine Hcl] Other (See Comments)    Makes her feel "hyper"  . Cephalexin     REACTION: coarse rash  .  Ciprofloxacin   . Mucinex [Guaifenesin Er] Other (See Comments)    Makes her feel worse  . Shellfish Allergy Hives  . Sudafed [Pseudoephedrine Hcl] Hives and Other (See Comments)    Itching and hives asthma  . Sulfonamide Derivatives     Social History   Socioeconomic History  . Marital status: Married    Spouse name: Not on file  . Number of children: 2  . Years of education: some college  . Highest education level: Not on file  Occupational History  . Occupation: Retired  Scientific laboratory technician  . Financial resource strain: Not hard at all  . Food insecurity:    Worry: Never true    Inability: Never true  . Transportation needs:    Medical: No    Non-medical: No  Tobacco Use  . Smoking status: Never Smoker  . Smokeless tobacco: Never Used  Substance and Sexual Activity  . Alcohol use: No    Alcohol/week: 0.0 standard drinks  . Drug use: No  . Sexual activity: Yes    Birth control/protection: Post-menopausal    Comment: number of sex partners in the last 12 months  1  Lifestyle  . Physical activity:    Days per week: 7 days    Minutes per session: 20 min  . Stress: Rather much  Relationships  . Social connections:    Talks on phone: More than three times a week    Gets together: More than three times a week    Attends religious service: Patient refused    Active member of club or organization: No    Attends meetings of clubs or organizations: Never    Relationship status: Married  . Intimate partner violence:    Fear of current or ex partner: No    Emotionally abused: No    Physically abused: No    Forced sexual activity: No  Other Topics Concern  . Not on file  Social History Narrative   Patient gets regular exercise. Cares for her grandchildren.    Review of Systems  Constitutional: Negative.   HENT: Negative.  Negative for congestion and sore throat.   Eyes: Negative.   Respiratory: Negative for cough and shortness of breath.   Cardiovascular: Negative.   Negative for chest pain and palpitations.  Gastrointestinal: Negative for abdominal pain, diarrhea, nausea and vomiting.  Genitourinary: Negative for dysuria and hematuria.  Musculoskeletal: Negative for myalgias.  Skin: Negative.   Neurological: Negative for dizziness and headaches.  Psychiatric/Behavioral: The patient is nervous/anxious.   All other systems reviewed and are negative.   Objective   Vitals as reported by the patient: None available There were no vitals filed for this visit.  Spencer was seen today for establish care.  Diagnoses and all orders for this visit:  Essential hypertension -     metoprolol tartrate (LOPRESSOR) 25 MG tablet; Take  1 tablet (25 mg total) by mouth 2 (two) times daily. -     hydrochlorothiazide (HYDRODIURIL) 25 MG tablet; Take 1 tablet (25 mg total) by mouth daily.  Generalized anxiety disorder -     ALPRAZolam (XANAX) 0.25 MG tablet; Take 1 tablet (0.25 mg total) by mouth daily as needed. for anxiety  Allergic rhinitis, unspecified seasonality, unspecified trigger -     loratadine (CLARITIN) 10 MG tablet; Take 1 tablet (10 mg total) by mouth daily.  No medical concerns identified today.   I discussed the assessment and treatment plan with the patient. The patient was provided an opportunity to ask questions and all were answered. The patient agreed with the plan and demonstrated an understanding of the instructions.   The patient was advised to call back or seek an in-person evaluation if the symptoms worsen or if the condition fails to improve as anticipated.  I provided 15 minutes of non-face-to-face time during this encounter.  Horald Pollen, MD  Primary Care at [facility]

## 2018-10-14 NOTE — Patient Instructions (Signed)
° ° ° °  If you have lab work done today you will be contacted with your lab results within the next 2 weeks.  If you have not heard from us then please contact us. The fastest way to get your results is to register for My Chart. ° ° °IF you received an x-ray today, you will receive an invoice from [facility]. Please contact  Radiology at [phone] with questions or concerns regarding your invoice.  ° °IF you received labwork today, you will receive an invoice from [facility]. Please contact [facility] at [phone] with questions or concerns regarding your invoice.  ° °Our billing staff will not be able to assist you with questions regarding bills from these companies. ° °You will be contacted with the lab results as soon as they are available. The fastest way to get your results is to activate your My Chart account. Instructions are located on the last page of this paperwork. If you have not heard from us regarding the results in 2 weeks, please contact this office. °  ° ° ° °

## 2018-10-29 ENCOUNTER — Encounter: Payer: Self-pay | Admitting: Gastroenterology

## 2018-11-08 ENCOUNTER — Telehealth: Payer: Self-pay | Admitting: Gastroenterology

## 2018-11-08 NOTE — Telephone Encounter (Signed)
Patient set up for virtual visit on 11/12/18 10:30

## 2018-11-08 NOTE — Telephone Encounter (Signed)
Ideally, would have follow-up encounter with me next week to discuss. Otherwise, nitroglycerin ointment 0.125% gel applied to the rectum TID x 6-8 weeks, increase daily water intake, and continue her normal regimen of Miralax, Benefiber, and Align. Thank you.

## 2018-11-08 NOTE — Telephone Encounter (Signed)
Pt stated that she has a hx of anal fissure and that she has been constipated, experiencing some bright red blood in stool.  Please advise.

## 2018-11-12 ENCOUNTER — Other Ambulatory Visit: Payer: Self-pay

## 2018-11-12 ENCOUNTER — Ambulatory Visit (INDEPENDENT_AMBULATORY_CARE_PROVIDER_SITE_OTHER): Payer: Medicare Other | Admitting: Gastroenterology

## 2018-11-12 ENCOUNTER — Encounter: Payer: Self-pay | Admitting: Gastroenterology

## 2018-11-12 VITALS — Ht 67.0 in | Wt 132.0 lb

## 2018-11-12 DIAGNOSIS — K625 Hemorrhage of anus and rectum: Secondary | ICD-10-CM | POA: Diagnosis not present

## 2018-11-12 DIAGNOSIS — K602 Anal fissure, unspecified: Secondary | ICD-10-CM | POA: Diagnosis not present

## 2018-11-12 NOTE — Patient Instructions (Addendum)
Move It Magic that we discsused: Equal parts of applesauce, bran, and prune juice. Store in a sealed container in the [place]. Use 2 tablespoons nightly. Increase as needed. Sit on the potty after having your morning cup of coffee to try to have a bowel movement.   Continue with Miralax and Benefiber.  We refilled your Nitroglycerin ointment today so that you can have it at home if needed.  Resume drinking more water. You should drink at least 64 ounces of water daily.  You are due a colonoscopy this spring. We will schedule this when the coronavirus restrictions are lifted.   Call me with any additional questions or concerns before then.

## 2018-11-12 NOTE — Progress Notes (Signed)
TELEHEALTH VISIT  Referring Provider: Horald Pollen, * Primary Care Physician:  Horald Pollen, MD   Tele-visit due to COVID-19 pandemic Patient requested visit virtually, consented to the virtual encounter via audio enabled telemedicine application Contact made at: 10:35 11/12/18 Patient verified by name and date of birth Location of patient: Home Location provider: Office Names of persons participating: Me, patient, Magdalene River CMA Time spent on telehealth visit: 14 minutes I discussed the limitations of evaluation and management by telemedicine. The patient expressed understanding and agreed to proceed.  Chief complaint:  Colonoscopy   IMPRESSION:  Anal fissure with associated bleeding Rectal bleeding from internal hemorrhoids Prior hemorrhoidectomy from thrombosed hemorrhoids Chronic constipation on Miralax Last colonoscopy 2010 Mild diverticulosis   Anal fissure.  Exacerbated by severe anxiety. Surveillance colonoscopy due 2020.   PLAN: Nitroglycerin ointment 0.125% gel applied to the rectum TID x 6-8 weeks (refilled today, no lidocaine in the script) Continue Miralax, Benefiber, and Align Reviewed Move It Magic Potion (applesauce:bran:prune juice) Routine colonoscopy when Covid19 restrictions have been lifted   HPI: Ruth Walker is a 73 y.o. female with an anal fissure 05/28/18. Symptoms controlled after 6-8 weeks of Nitroglycerin 0.125% gel applied to the rectum TID. She was doing well until the coronavirus restrictions started. Not drinking as much water and not eating as well.  Constipated BM on Saturday. Hard. Strained. Small tear with noted bleeding. She became concerned about being stuck in quarantine and having something bad happen. No further bleeding after the [facility] Saturday. No rectal pain.  No other associated symptoms. No identified exacerbating or relieving features.   Last colonoscopy 09/16/2008 with Dr. [name]. She is very concerned  about coronavirus and is anxious about having a colonoscopy.    Past Medical History:  Diagnosis Date  . Allergy   . Anal fissure   . Anxiety states   . Asthma    Gets allergy shots once a week   . Cholelithiasis   . Depression   . Diverticulosis   . Environmental allergies   . Hemorrhoids   . Irritable bowel syndrome (IBS)   . Osteoporosis   . Skin cancer    Leg  . Uterine polyp   . Vertebral compression fracture [facility])     Past Surgical History:  Procedure Laterality Date  . BACK SURGERY    . CESAREAN SECTION    . DILATION AND CURETTAGE OF UTERUS    . Endometrial polypectomy    . FRACTURE SURGERY     neck (vertebrae)  . HEMORRHOID SURGERY    . HYSTEROSCOPY    . IBS    . MOUTH SURGERY    . SKIN CANCER EXCISION     Leg     Current Outpatient Medications  Medication Sig Dispense Refill  . ALPRAZolam (XANAX) 0.25 MG tablet Take 1 tablet (0.25 mg total) by mouth daily as needed. for anxiety 60 tablet 0  . aspirin 81 MG tablet Take 81 mg by mouth daily.      . Calcium Carbonate (CALCIUM 600 PO) Take 1 tablet by mouth 2 (two) times daily.    . cycloSPORINE (RESTASIS) 0.05 % ophthalmic emulsion 1 drop 2 (two) times daily.      Marland Kitchen estradiol (ESTRACE) 0.1 MG/GM vaginal cream Place 4.14 Applicatorfuls vaginally 2 (two) times a week. 42.5 g 8  . fluticasone (FLONASE) 50 MCG/ACT nasal spray Place 2 sprays into both nostrils as needed. 16 g 3  . hydrochlorothiazide (HYDRODIURIL) 25 MG tablet Take 1  tablet (25 mg total) by mouth daily. 90 tablet 3  . loratadine (CLARITIN) 10 MG tablet Take 1 tablet (10 mg total) by mouth daily. 90 tablet 3  . metoprolol tartrate (LOPRESSOR) 25 MG tablet Take 1 tablet (25 mg total) by mouth 2 (two) times daily. 180 tablet 3  . polyethylene glycol (MIRALAX / GLYCOLAX) packet Take 17 g by mouth daily. Reported on 07/10/2015    . Probiotic Product (ALIGN) 4 MG CAPS Take 1 capsule by mouth daily. 21 capsule 0  . Wheat Dextrin (BENEFIBER DRINK MIX PO)  Take 10 mLs by mouth every morning. Take 2 teaspoons every morning      . AMBULATORY NON FORMULARY MEDICATION Medication Name: Nitroglycerin 0.125% gel with 2% lidocaine apply rectally tid for 6-8 weeks (Patient not taking: Reported on 11/12/2018) 1 Tube 1  . PRESCRIPTION MEDICATION Allergy injection once a week.[facility] [place]     No current facility-administered medications for this visit.     Allergies as of 11/12/2018 - Review Complete 11/12/2018  Allergen Reaction Noted  . Aloe  12/06/2011  . Benadryl [diphenhydramine hcl] Other (See Comments) 06/24/2012  . Cephalexin  08/25/2008  . Ciprofloxacin  12/06/2011  . Mucinex [guaifenesin er] Other (See Comments) 06/24/2012  . Shellfish allergy Hives 12/06/2011  . Sudafed [pseudoephedrine hcl] Hives and Other (See Comments) 06/24/2012  . Sulfonamide derivatives  02/28/2008    Family History  Problem Relation Age of Onset  . Diabetes Mother   . Hypertension Mother   . Thyroid disease Mother   . Hypertension Father   . Heart disease Father   . Thyroid disease Sister   . Colon cancer Neg Hx       Physical Exam: General: in no acute distress Neuro: Alert and appropriate Psych: Normal affect and normal insight   Kesi Perrow L. Tarri Glenn, MD, MPH [facility] 11/12/2018, 10:24 AM

## 2018-12-12 ENCOUNTER — Telehealth: Payer: Self-pay | Admitting: Emergency Medicine

## 2018-12-12 NOTE — Telephone Encounter (Signed)
Patient states she is not going to have any procedures or even come into a doctors office until spring 2021. PLEASE DO NOT ASK HER ANYMORE. SHE DOES NOT WANT TO BE CALLED AGAIN.

## 2018-12-17 ENCOUNTER — Telehealth: Payer: Self-pay | Admitting: *Deleted

## 2018-12-17 NOTE — Telephone Encounter (Signed)
Schedule AWV.  

## 2019-02-25 ENCOUNTER — Telehealth: Payer: Self-pay | Admitting: Gastroenterology

## 2019-02-25 NOTE — Telephone Encounter (Signed)
Called the patient from both home and cell, no answer, unable to leave messages on both.

## 2019-02-26 ENCOUNTER — Ambulatory Visit (INDEPENDENT_AMBULATORY_CARE_PROVIDER_SITE_OTHER): Payer: Medicare Other | Admitting: Gastroenterology

## 2019-02-26 ENCOUNTER — Encounter: Payer: Self-pay | Admitting: Gastroenterology

## 2019-02-26 VITALS — BP 130/80 | HR 92 | Temp 99.1°F | Ht 65.0 in | Wt 140.5 lb

## 2019-02-26 DIAGNOSIS — K625 Hemorrhage of anus and rectum: Secondary | ICD-10-CM | POA: Diagnosis not present

## 2019-02-26 DIAGNOSIS — K602 Anal fissure, unspecified: Secondary | ICD-10-CM | POA: Diagnosis not present

## 2019-02-26 MED ORDER — AMBULATORY NON FORMULARY MEDICATION: 1 refills | Status: AC

## 2019-02-26 NOTE — Patient Instructions (Addendum)
You have a small anal fissure on rectal exam.   High fiber diet recommended.  Please drink at least 1.5-2 liters of water every day.  Continue to use Miralax and a stool bulking agent such as Benefiber every day.   I have prescribed Nitroglycerin ointment 0.125%  Sitz Baths may provide some additional relief.  It's time for a colonoscopy. We should proceed with scheduling your exam.    How to Take a Sitz Bath A sitz bath is a warm water bath that may be used to care for your rectum, genital area, or the area between your rectum and genitals (perineum). For a sitz bath, the water only comes up to your hips and covers your buttocks. A sitz bath may done at home in a bathtub or with a portable sitz bath that fits over the toilet. Your health care provider may recommend a sitz bath to help:  Relieve pain and discomfort after delivering a baby.  Relieve pain and itching from hemorrhoids or anal fissures.  Relieve pain after certain surgeries.  Relax muscles that are sore or tight. How to take a sitz bath Take 3-4 sitz baths a day, or as many as told by your health care provider. Bathtub sitz bath To take a sitz bath in a bathtub: 1. Partially fill a bathtub with warm water. The water should be deep enough to cover your hips and buttocks when you are sitting in the tub. 2. If your health care provider told you to put medicine in the water, follow his or her instructions. 3. Sit in the water. 4. Open the tub drain a little, and leave it open during your bath. 5. Turn on the warm water again, enough to replace the water that is draining out. Keep the water running throughout your bath. This helps keep the water at the right level and the right temperature. 6. Soak in the water for 15-20 minutes, or as long as told by your health care provider. 7. When you are done, be careful when you stand up. You may feel dizzy. 8. After the sitz bath, pat yourself dry. Do not rub your skin to dry  it.  Over-the-toilet sitz bath To take a sitz bath with an over-the-toilet basin: 1. Follow the manufacturer's instructions. 2. Fill the basin with warm water. 3. If your health care provider told you to put medicine in the water, follow his or her instructions. 4. Sit on the seat. Make sure the water covers your buttocks and perineum. 5. Soak in the water for 15-20 minutes, or as long as told by your health care provider. 6. After the sitz bath, pat yourself dry. Do not rub your skin to dry it. 7. Clean and dry the basin between uses. 8. Discard the basin if it cracks, or according to the manufacturer's instructions. Contact a health care provider if:  Your symptoms get worse. Do not continue with sitz baths if your symptoms get worse.  You have new symptoms. If this happens, do not continue with sitz baths until you talk with your health care provider. Summary  A sitz bath is a warm water bath in which the water only comes up to your hips and covers your buttocks.  A sitz bath may help relieve itching, relieve pain, and relax muscles that are sore or tight in the lower part of your body, including your genital area.  Take 3-4 sitz baths a day, or as many as told by your health care provider. Soak  in the water for 15-20 minutes.  Do not continue with sitz baths if your symptoms get worse. This information is not intended to replace advice given to you by your health care provider. Make sure you discuss any questions you have with your health care provider. Document Released: 03/18/2004 Document Revised: 06/28/2017 Document Reviewed: 06/28/2017 Elsevier Patient Education  2020 [employer].

## 2019-02-26 NOTE — Telephone Encounter (Signed)
Unable to speak to the patient, patient arrived for in office appt with Dr. Tarri Glenn.

## 2019-02-26 NOTE — Progress Notes (Signed)
Referring Provider: Horald Pollen, * Primary Care Physician:  Horald Pollen, MD  Chief complaint:  Colonoscopy   IMPRESSION:  Recurrent anal fissure with associated bleeding History of rectal bleeding from internal hemorrhoids Prior hemorrhoidectomy for thrombosed hemorrhoids Chronic constipation on Miralax and Benefiber Last colonoscopy 2010 Ruth Walker) Mild diverticulosis   Recurrent anal fissure.  Exacerbated by severe anxiety. I strongly encouraged her to schedule her surveillance colonoscopy that is due this year.  PLAN: Nitroglycerin ointment 0.125% gel applied to the rectum TID x 6-8 weeks (refilled today, no lidocaine in the script) Continue Miralax, Benefiber, and Align Drink plenty of water Routine colonoscopy recommended - she wants to wait until coronavirus numbers improve   HPI: Ruth Walker is a 73 y.o. female who requested an appointment for constipation with rectal bleeding. She was seen by me for an anal fissure 05/28/18. Symptoms controlled after 6-8 weeks of Nitroglycerin 0.125% gel applied to the rectum TID. She was doing well until the coronavirus restrictions started. Not drinking as much water and not eating as well.  43 year old mother died 3 weeks ago. Under significant stress. Staying with her daughter and her bowel habits have changed. Has exacerbation in her constipation. Episode of rectal pain with a sense of tearing after straining to have a large bowel movement followed by rectal bleeding that occurred yesterday.  No other associated symptoms. No identified exacerbating or relieving features.   Last colonoscopy 09/16/2008 with Dr. Sharlett Walker. She is very concerned about coronavirus and remains anxious about having a colonoscopy. Declined scheduling her colonoscopy at this time.    Past Medical History:  Diagnosis Date  . Allergy   . Anal fissure   . Anxiety states   . Asthma    Gets allergy shots once a week   .  Cholelithiasis   . Depression   . Diverticulosis   . Environmental allergies   . Hemorrhoids   . Irritable bowel syndrome (IBS)   . Osteoporosis   . Skin cancer    Leg  . Uterine polyp   . Vertebral compression fracture [facility])     Past Surgical History:  Procedure Laterality Date  . BACK SURGERY    . CESAREAN SECTION    . DILATION AND CURETTAGE OF UTERUS    . Endometrial polypectomy    . FRACTURE SURGERY     neck (vertebrae)  . HEMORRHOID SURGERY    . HYSTEROSCOPY    . IBS    . MOUTH SURGERY    . SKIN CANCER EXCISION     Leg     Current Outpatient Medications  Medication Sig Dispense Refill  . ALPRAZolam (XANAX) 0.25 MG tablet Take 1 tablet (0.25 mg total) by mouth daily as needed. for anxiety 60 tablet 0  . AMBULATORY NON FORMULARY MEDICATION Medication Name: Nitroglycerin 0.125% gel with 2% lidocaine apply rectally tid for 6-8 weeks (Patient not taking: Reported on 11/12/2018) 1 Tube 1  . aspirin 81 MG tablet Take 81 mg by mouth daily.      . Calcium Carbonate (CALCIUM 600 PO) Take 1 tablet by mouth 2 (two) times daily.    . cycloSPORINE (RESTASIS) 0.05 % ophthalmic emulsion 1 drop 2 (two) times daily.      Marland Kitchen estradiol (ESTRACE) 0.1 MG/GM vaginal cream Place 2.35 Applicatorfuls vaginally 2 (two) times a week. 42.5 g 8  . fluticasone (FLONASE) 50 MCG/ACT nasal spray Place 2 sprays into both nostrils as needed. 16 g 3  . hydrochlorothiazide (  HYDRODIURIL) 25 MG tablet Take 1 tablet (25 mg total) by mouth daily. 90 tablet 3  . loratadine (CLARITIN) 10 MG tablet Take 1 tablet (10 mg total) by mouth daily. 90 tablet 3  . metoprolol tartrate (LOPRESSOR) 25 MG tablet Take 1 tablet (25 mg total) by mouth 2 (two) times daily. 180 tablet 3  . polyethylene glycol (MIRALAX / GLYCOLAX) packet Take 17 g by mouth daily. Reported on 07/10/2015    . PRESCRIPTION MEDICATION Allergy injection once a week.[facility] [employer]    . Probiotic Product (ALIGN) 4 MG CAPS Take 1 capsule by  mouth daily. 21 capsule 0  . Wheat Dextrin (BENEFIBER DRINK MIX PO) Take 10 mLs by mouth every morning. Take 2 teaspoons every morning       No current facility-administered medications for this visit.     Allergies as of 02/26/2019 - Review Complete 11/12/2018  Allergen Reaction Noted  . Aloe  12/06/2011  . Benadryl [diphenhydramine hcl] Other (See Comments) 06/24/2012  . Cephalexin  08/25/2008  . Ciprofloxacin  12/06/2011  . Mucinex [guaifenesin er] Other (See Comments) 06/24/2012  . Shellfish allergy Hives 12/06/2011  . Sudafed [pseudoephedrine hcl] Hives and Other (See Comments) 06/24/2012  . Sulfonamide derivatives  02/28/2008    Family History  Problem Relation Age of Onset  . Diabetes Mother   . Hypertension Mother   . Thyroid disease Mother   . Hypertension Father   . Heart disease Father   . Thyroid disease Sister   . Colon cancer Neg Hx       Physical Exam: General: in no acute distress Neuro: Alert and appropriate Psych: Normal affect and normal insight   Ruth Rockhold L. Tarri Glenn, MD, MPH [facility] 02/26/2019, 4:02 AM

## 2019-07-05 ENCOUNTER — Other Ambulatory Visit: Payer: Self-pay | Admitting: Emergency Medicine

## 2019-07-05 DIAGNOSIS — F411 Generalized anxiety disorder: Secondary | ICD-10-CM

## 2019-07-05 NOTE — Telephone Encounter (Signed)
Forwarding medication refill request to the clinical pool for review. 

## 2019-07-07 NOTE — Telephone Encounter (Signed)
Patient is requesting a refill of the following medications: Requested Prescriptions   Pending Prescriptions Disp Refills   ALPRAZolam (XANAX) 0.25 MG tablet [Pharmacy Med Name: ALPRAZOLAM 0.25MG  TABLETS] 60 tablet     Sig: TAKE 1 TABLET(0.25 MG) BY MOUTH DAILY AS NEEDED FOR ANXIETY    Date of patient request: 07/05/19 Last office visit: 10/14/18 Date of last refill: 10/14/18 Last refill amount: 60 Follow up time period per chart: none listed

## 2019-07-28 DIAGNOSIS — J301 Allergic rhinitis due to pollen: Secondary | ICD-10-CM | POA: Diagnosis not present

## 2019-07-28 DIAGNOSIS — J3081 Allergic rhinitis due to animal (cat) (dog) hair and dander: Secondary | ICD-10-CM | POA: Diagnosis not present

## 2019-07-28 DIAGNOSIS — J3089 Other allergic rhinitis: Secondary | ICD-10-CM | POA: Diagnosis not present

## 2019-07-31 DIAGNOSIS — J301 Allergic rhinitis due to pollen: Secondary | ICD-10-CM | POA: Diagnosis not present

## 2019-07-31 DIAGNOSIS — J3089 Other allergic rhinitis: Secondary | ICD-10-CM | POA: Diagnosis not present

## 2019-07-31 DIAGNOSIS — J3081 Allergic rhinitis due to animal (cat) (dog) hair and dander: Secondary | ICD-10-CM | POA: Diagnosis not present

## 2019-08-21 DIAGNOSIS — J301 Allergic rhinitis due to pollen: Secondary | ICD-10-CM | POA: Diagnosis not present

## 2019-08-21 DIAGNOSIS — J3081 Allergic rhinitis due to animal (cat) (dog) hair and dander: Secondary | ICD-10-CM | POA: Diagnosis not present

## 2019-08-21 DIAGNOSIS — J3089 Other allergic rhinitis: Secondary | ICD-10-CM | POA: Diagnosis not present

## 2019-09-10 DIAGNOSIS — J3089 Other allergic rhinitis: Secondary | ICD-10-CM | POA: Diagnosis not present

## 2019-09-10 DIAGNOSIS — J3081 Allergic rhinitis due to animal (cat) (dog) hair and dander: Secondary | ICD-10-CM | POA: Diagnosis not present

## 2019-09-10 DIAGNOSIS — J301 Allergic rhinitis due to pollen: Secondary | ICD-10-CM | POA: Diagnosis not present

## 2019-09-16 ENCOUNTER — Ambulatory Visit: Payer: Medicare PPO | Admitting: Emergency Medicine

## 2019-09-16 ENCOUNTER — Encounter: Payer: Self-pay | Admitting: Emergency Medicine

## 2019-09-16 ENCOUNTER — Telehealth: Payer: Self-pay | Admitting: Gastroenterology

## 2019-09-16 ENCOUNTER — Other Ambulatory Visit: Payer: Self-pay

## 2019-09-16 VITALS — BP 166/94 | HR 101 | Temp 99.1°F | Resp 16 | Ht 66.0 in | Wt 139.0 lb

## 2019-09-16 DIAGNOSIS — K602 Anal fissure, unspecified: Secondary | ICD-10-CM | POA: Diagnosis not present

## 2019-09-16 NOTE — Telephone Encounter (Signed)
Pt states she is on the way to [place] to be seen at urgent care. States she pushed to hard with BM and aggravated her fissure and wants to be checked.

## 2019-09-16 NOTE — Patient Instructions (Addendum)
If you have lab work done today you will be contacted with your lab results within the next 2 weeks.  If you have not heard from [place] then please contact us. The fastest way to get your results is to register for My Chart.   IF you received an x-ray today, you will receive an invoice from [facility]. Please contact [facility] at [phone] with questions or concerns regarding your invoice.   IF you received labwork today, you will receive an invoice from [facility]. Please contact [facility] at [phone] with questions or concerns regarding your invoice.   Our billing staff will not be able to assist you with questions regarding bills from these companies.  You will be contacted with the lab results as soon as they are available. The fastest way to get your results is to activate your My Chart account. Instructions are located on the last page of this paperwork. If you have not heard from [place] regarding the results in 2 weeks, please contact this office.     Anal Fissure, Adult  An anal fissure is a small tear or crack in the tissue around the opening of the butt (anus). Bleeding from the tear or crack usually stops on its own within a few minutes. The bleeding may happen every time you poop (have a bowel movement) until the tear or crack heals. What are the causes? This condition is usually caused by passing a large or hard poop (stool). Other causes include:  Trouble pooping (constipation).  Passing watery poop (diarrhea).  Inflammatory bowel disease (Crohn's disease or ulcerative colitis).  Childbirth.  Infections.  Anal sex. What are the signs or symptoms? Symptoms of this condition include:  Bleeding from the butt.  Small amounts of blood on your poop. The blood coats the outside of the poop. It is not mixed with the poop.  Small amounts of blood on the toilet paper or in the toilet after you poop.  Pain when passing poop.  Itching or irritation  around the opening of the butt. How is this diagnosed? This condition may be diagnosed based on a physical exam. Your doctor may:  Check your butt. A tear can often be seen by checking the area with care.  Check your butt using a short tube (anoscope). The light in the tube will show any problems in your butt. How is this treated? Treatment for this condition may include:  Treating problems that make it hard for you to pass poop. You may be told to: ? Eat more fiber. ? Drink more fluid. ? Take fiber supplements. ? Take medicines that make poop soft.  Taking sitz baths. This may help to heal the tear.  Using creams and ointments. If your condition gets worse, other treatments may be needed such as:  A shot near the tear or crack (botulinum injection).  Surgery to repair the tear or crack. Follow these instructions at home: Eating and drinking   Avoid bananas and dairy products. These foods can make it hard to poop.  Drink enough fluid to keep your pee (urine) pale yellow.  Eat foods that have a lot of fiber in them, such as: ? Beans. ? Whole grains. ? Fresh fruits. ? Fresh vegetables. General instructions   Take over-the-counter and prescription medicines only as told by your doctor.  Use creams or ointments only as told by your doctor.  Keep the butt area as clean and dry as you can.  Take a warm  water bath (sitz bath) as told by your doctor. Do not use soap.  Keep all follow-up visits as told by your doctor. This is important. Contact a doctor if:  You have more bleeding.  You have a fever.  You have watery poop that is mixed with blood.  You have pain.  Your problem gets worse, not better. Summary  An anal fissure is a small tear or crack in the skin around the opening of the butt (anus).  This condition is usually caused by passing a large or hard poop (stool).  Treatment includes treating the problems that make it hard for you to pass  poop.  Follow your doctor's instructions about caring for your condition at home.  Keep all follow-up visits as told by your doctor. This is important. This information is not intended to replace advice given to you by your health care provider. Make sure you discuss any questions you have with your health care provider. Document Revised: 12/06/2017 Document Reviewed: 12/06/2017 Elsevier Patient Education  [place].

## 2019-09-16 NOTE — Progress Notes (Signed)
Ruth Walker 74 y.o.   Chief Complaint  Patient presents with  . Anal Fissure  Most recent GI visit, assessment and plan:  IMPRESSION:  Recurrent anal fissure with associated bleeding History of rectal bleeding from internal hemorrhoids Prior hemorrhoidectomy for thrombosed hemorrhoids Chronic constipation on Miralax and Benefiber Last colonoscopy 2010 [name]) Mild diverticulosis  PLAN: Nitroglycerin ointment0.125% gel applied to the rectum TID x 6-8 weeks(refilled today, no lidocaine in the script) Continue Miralax, Benefiber, and Align Drink plenty of water Routine colonoscopy recommended - she wants to wait until coronavirus numbers improveRoutine colonoscopy recommended - she wants to wait until coronavirus numbers improve   HISTORY OF PRESENT ILLNESS: This is a 74 y.o. female complaining of painful nonbleeding anal fissure for several days. No other significant symptoms.  HPI   Prior to Admission medications   Medication Sig Start Date End Date Taking? Authorizing Provider  ALPRAZolam (XANAX) 0.25 MG tablet TAKE 1 TABLET(0.25 MG) BY MOUTH DAILY AS NEEDED FOR ANXIETY 07/07/19  Yes Inette Doubrava, Ines Bloomer, MD  aspirin 81 MG tablet Take 81 mg by mouth daily.     Yes [provider]  Calcium Carbonate (CALCIUM 600 PO) Take 1 tablet by mouth 2 (two) times daily.   Yes [provider]  cycloSPORINE (RESTASIS) 0.05 % ophthalmic emulsion 1 drop 2 (two) times daily.     Yes [provider]  estradiol (ESTRACE) 0.1 MG/GM vaginal cream Place [id number] Applicatorfuls vaginally 2 (two) times a week. 02/24/15  Yes Roselee Culver, MD  fluticasone [facility]) 50 MCG/ACT nasal spray Place 2 sprays into both nostrils as needed. 09/16/18  Yes Bijon Mineer, Ines Bloomer, MD  polyethylene glycol [facility] / GLYCOLAX) packet Take 17 g by mouth daily. Reported on 07/10/2015   Yes [provider]  PRESCRIPTION MEDICATION Allergy injection once a week.Marland KitchenMarland KitchenLeBauer  Allergy at [facility]   Yes [provider]  Probiotic Product (ALIGN) 4 MG CAPS Take 1 capsule by mouth daily. 07/05/11  Yes Sable Feil, MD  Wheat Dextrin (BENEFIBER DRINK MIX PO) Take 10 mLs by mouth every morning. Take 2 teaspoons every morning     Yes [provider]  AMBULATORY NON FORMULARY MEDICATION Medication Name: Nitroglycerin 0.125% gel, apply rectally tid for 6-8 weeks Patient not taking: Reported on 09/16/2019 02/26/19   Thornton Park, MD  hydrochlorothiazide (HYDRODIURIL) 25 MG tablet Take 1 tablet (25 mg total) by mouth daily. 10/14/18 02/26/19  Horald Pollen, MD  loratadine (CLARITIN) 10 MG tablet Take 1 tablet (10 mg total) by mouth daily. 10/14/18 02/26/19  Horald Pollen, MD  metoprolol tartrate (LOPRESSOR) 25 MG tablet Take 1 tablet (25 mg total) by mouth 2 (two) times daily. 10/14/18 02/26/19  Horald Pollen, MD  Linaclotide [facility]) 145 MCG CAPS Take 1 capsule by mouth daily. 09/05/11 10/03/11  Sable Feil, MD    Allergies  Allergen Reactions  . Aloe   . Benadryl [Diphenhydramine Hcl] Other (See Comments)    Makes her feel "hyper"  . Cephalexin     REACTION: coarse rash  . Ciprofloxacin   . Mucinex [Guaifenesin Er] Other (See Comments)    Makes her feel worse  . Shellfish Allergy Hives  . Sudafed [Pseudoephedrine Hcl] Hives and Other (See Comments)    Itching and hives asthma  . Sulfonamide Derivatives     Patient Active Problem List   Diagnosis Date Noted  . Uterine polyp   . Vertebral compression fracture ([place])   . IBS (irritable bowel syndrome) 09/05/2011  .  Constipation, slow transit 09/05/2011  . Anxiety disorder 09/05/2011  . Diverticulosis 11/04/2010  . Gallstones 11/04/2010  . ANAL FISSURE 05/26/2008  . OBSESSIVE-COMPULSIVE PERSONALITY DISORDER 02/28/2008  . Essential hypertension 02/28/2008  . CYSTITIS, CHRONIC 02/28/2008    Past Medical History:  Diagnosis Date  . Allergy   . Anal fissure   .  Anxiety states   . Asthma    Gets allergy shots once a week   . Cholelithiasis   . Depression   . Diverticulosis   . Environmental allergies   . Hemorrhoids   . Irritable bowel syndrome (IBS)   . Osteoporosis   . Skin cancer    Leg  . Uterine polyp   . Vertebral compression fracture [facility])     Past Surgical History:  Procedure Laterality Date  . BACK SURGERY    . CESAREAN SECTION    . DILATION AND CURETTAGE OF UTERUS    . Endometrial polypectomy    . FRACTURE SURGERY     neck (vertebrae)  . HEMORRHOID SURGERY    . HYSTEROSCOPY    . IBS    . MOUTH SURGERY    . SKIN CANCER EXCISION     Leg     Social History   Socioeconomic History  . Marital status: Married    Spouse name: Not on file  . Number of children: 2  . Years of education: some college  . Highest education level: Not on file  Occupational History  . Occupation: Retired  Tobacco Use  . Smoking status: Never Smoker  . Smokeless tobacco: Never Used  Substance and Sexual Activity  . Alcohol use: No    Alcohol/week: 0.0 standard drinks  . Drug use: No  . Sexual activity: Yes    Birth control/protection: Post-menopausal    Comment: number of sex partners in the last 71 months  1  Other Topics Concern  . Not on file  Social History Narrative   Patient gets regular exercise. Cares for her grandchildren.   Social Determinants of Health   Financial Resource Strain:   . Difficulty of Paying Living Expenses: Not on file  Food Insecurity:   . Worried About Charity fundraiser in the Last Year: Not on file  . Ran Out of Food in the Last Year: Not on file  Transportation Needs:   . Lack of Transportation (Medical): Not on file  . Lack of Transportation (Non-Medical): Not on file  Physical Activity:   . Days of Exercise per Week: Not on file  . Minutes of Exercise per Session: Not on file  Stress:   . Feeling of Stress : Not on file  Social Connections:   . Frequency of Communication with Friends and  Family: Not on file  . Frequency of Social Gatherings with Friends and Family: Not on file  . Attends Religious Services: Not on file  . Active Member of Clubs or Organizations: Not on file  . Attends Archivist Meetings: Not on file  . Marital Status: Not on file  Intimate Partner Violence:   . Fear of Current or Ex-Partner: Not on file  . Emotionally Abused: Not on file  . Physically Abused: Not on file  . Sexually Abused: Not on file    Family History  Problem Relation Age of Onset  . Diabetes Mother   . Hypertension Mother   . Thyroid disease Mother   . Hypertension Father   . Heart disease Father   . Thyroid disease Sister   .  Colon cancer Neg Hx      Review of Systems  Constitutional: Negative.  Negative for chills and fever.  HENT: Positive for congestion and sinus pain.   Respiratory: Negative.  Negative for cough and shortness of breath.   Cardiovascular: Negative.  Negative for chest pain and palpitations.  Gastrointestinal: Negative.  Negative for abdominal pain, diarrhea, nausea and vomiting.  Genitourinary: Negative.   Skin: Negative.  Negative for rash.  Neurological: Negative.  Negative for dizziness and headaches.  All other systems reviewed and are negative.  Vitals:   09/16/19 1513  BP: (!) 166/94  Pulse: (!) 101  Resp: 16  Temp: 99.1 F (37.3 C)  SpO2: 97%     Physical Exam Vitals reviewed.  Constitutional:      Appearance: Normal appearance.  HENT:     Head: Normocephalic.  Eyes:     Extraocular Movements: Extraocular movements intact.     Pupils: Pupils are equal, round, and reactive to light.  Cardiovascular:     Rate and Rhythm: Normal rate and regular rhythm.     Pulses: Normal pulses.     Heart sounds: Normal heart sounds.  Pulmonary:     Effort: Pulmonary effort is normal.     Breath sounds: Normal breath sounds.  Abdominal:     Palpations: Abdomen is soft.     Tenderness: There is no abdominal tenderness.   Genitourinary:    Rectum: Anal fissure (No bleeding) present.  Musculoskeletal:     Cervical back: Normal range of motion and neck supple.  Neurological:     Mental Status: She is alert.      ASSESSMENT & PLAN: Ruth Walker was seen today for anal fissure.  Diagnoses and all orders for this visit:  Anal fissure  Nitroglycerin ointment0.125% gel applied to the rectum TID x 6-8 weeks(refilled today, no lidocaine in the script) Continue Miralax, Benefiber, and Align Drink plenty of water   Patient Instructions       If you have lab work done today you will be contacted with your lab results within the next 2 weeks.  If you have not heard from [place] then please contact us. The fastest way to get your results is to register for My Chart.   IF you received an x-ray today, you will receive an invoice from [facility]. Please contact [facility] at [phone] with questions or concerns regarding your invoice.   IF you received labwork today, you will receive an invoice from [facility]. Please contact [facility] at [phone] with questions or concerns regarding your invoice.   Our billing staff will not be able to assist you with questions regarding bills from these companies.  You will be contacted with the lab results as soon as they are available. The fastest way to get your results is to activate your My Chart account. Instructions are located on the last page of this paperwork. If you have not heard from [place] regarding the results in 2 weeks, please contact this office.     Anal Fissure, Adult  An anal fissure is a small tear or crack in the tissue around the opening of the butt (anus). Bleeding from the tear or crack usually stops on its own within a few minutes. The bleeding may happen every time you poop (have a bowel movement) until the tear or crack heals. What are the causes? This condition is usually caused by passing a large or hard poop (stool). Other  causes include:  Trouble pooping (constipation).  Passing watery poop (diarrhea).  Inflammatory bowel disease (Crohn's disease or ulcerative colitis).  Childbirth.  Infections.  Anal sex. What are the signs or symptoms? Symptoms of this condition include:  Bleeding from the butt.  Small amounts of blood on your poop. The blood coats the outside of the poop. It is not mixed with the poop.  Small amounts of blood on the toilet paper or in the toilet after you poop.  Pain when passing poop.  Itching or irritation around the opening of the butt. How is this diagnosed? This condition may be diagnosed based on a physical exam. Your doctor may:  Check your butt. A tear can often be seen by checking the area with care.  Check your butt using a short tube (anoscope). The light in the tube will show any problems in your butt. How is this treated? Treatment for this condition may include:  Treating problems that make it hard for you to pass poop. You may be told to: ? Eat more fiber. ? Drink more fluid. ? Take fiber supplements. ? Take medicines that make poop soft.  Taking sitz baths. This may help to heal the tear.  Using creams and ointments. If your condition gets worse, other treatments may be needed such as:  A shot near the tear or crack (botulinum injection).  Surgery to repair the tear or crack. Follow these instructions at home: Eating and drinking   Avoid bananas and dairy products. These foods can make it hard to poop.  Drink enough fluid to keep your pee (urine) pale yellow.  Eat foods that have a lot of fiber in them, such as: ? Beans. ? Whole grains. ? Fresh fruits. ? Fresh vegetables. General instructions   Take over-the-counter and prescription medicines only as told by your doctor.  Use creams or ointments only as told by your doctor.  Keep the butt area as clean and dry as you can.  Take a warm water bath (sitz bath) as told by your  doctor. Do not use soap.  Keep all follow-up visits as told by your doctor. This is important. Contact a doctor if:  You have more bleeding.  You have a fever.  You have watery poop that is mixed with blood.  You have pain.  Your problem gets worse, not better. Summary  An anal fissure is a small tear or crack in the skin around the opening of the butt (anus).  This condition is usually caused by passing a large or hard poop (stool).  Treatment includes treating the problems that make it hard for you to pass poop.  Follow your doctor's instructions about caring for your condition at home.  Keep all follow-up visits as told by your doctor. This is important. This information is not intended to replace advice given to you by your health care provider. Make sure you discuss any questions you have with your health care provider. Document Revised: 12/06/2017 Document Reviewed: 12/06/2017 Elsevier Patient Education  2020 Elsevier Inc.     Agustina Caroli, MD [facility]

## 2019-09-18 ENCOUNTER — Other Ambulatory Visit: Payer: Self-pay | Admitting: Emergency Medicine

## 2019-09-18 NOTE — Telephone Encounter (Signed)
Requested medication (s) are due for refill today: yes  Requested medication (s) are on the active medication list: yes  Last refill:  09/16/19  Future visit scheduled: no  Notes to clinic:  historical provider   Requested Prescriptions  Pending Prescriptions Disp Refills   estradiol (ESTRACE) 0.1 MG/GM vaginal cream 42.5 g 8    Sig: Place 2 g vaginally 2 (two) times a week.      OB/GYN:  Estrogens Failed - 09/18/2019  2:02 PM      Failed - Last BP in normal range    BP Readings from Last 1 Encounters:  09/16/19 (!) 166/94          Passed - Mammogram is up-to-date per Health Maintenance      Passed - Valid encounter within last 12 months    Recent Outpatient Visits           2 days ago Anal fissure   [facility] at [facility], [employer], MD   11 months ago Essential hypertension   Primary Care at [place], Ines Bloomer, MD   1 year ago Generalized anxiety disorder   Primary Care at [place], Ines Bloomer, MD   1 year ago Fall in home, initial encounter   [facility] at [facility], Ranell Patrick, MD   1 year ago Sore throat   [facility] at [facility], Lilia Argue, MD

## 2019-09-18 NOTE — Telephone Encounter (Signed)
Medication Refill - Medication: estrace   Has the patient contacted their pharmacy? Yes.   (Agent: If no, request that the patient contact the pharmacy for the refill.) (Agent: If yes, when and what did the pharmacy advise?)  Preferred Pharmacy (with phone number or street name):  [facility], [place] [address]  [place] [address]  Phone: [phone] Fax: [phone]  Not a 24 hour pharmacy; exact hours not known     Agent: Please be advised that RX refills may take up to 3 business days. We ask that you follow-up with your pharmacy.

## 2019-09-20 ENCOUNTER — Telehealth: Payer: Self-pay | Admitting: Gastroenterology

## 2019-09-20 NOTE — Telephone Encounter (Signed)
Returned patient's call.  Was seen by Dr. Mitchel Honour at [facility] urgent care on March 9 at which time he refilled her nitroglycerin ointment for her fissure.  She admits that she has only been using it once daily at bedtime.  Has continued to see blood bright red in color with bowel movements as she has in the past.  She says that typically she responds to the nitroglycerin in just 1 or 2 days and was alarmed because she has not yet had improvement on this occasion.  Advised her that she needs to use the nitroglycerin 2 or 3 times daily as prescribed.  Can take several weeks for these to heal, which is usually the norm, not usually a couple of days.  She has an appointment with Dr. Tarri Glenn in 9 days.  She will keep that appointment.  Advised to keep stools soft with MiraLAX and avoid straining.  Can try sitz bath's as well.

## 2019-09-30 ENCOUNTER — Ambulatory Visit: Payer: Medicare Other | Admitting: Gastroenterology

## 2019-10-02 ENCOUNTER — Other Ambulatory Visit: Payer: Self-pay

## 2019-10-02 ENCOUNTER — Telehealth: Payer: Medicare PPO | Admitting: Emergency Medicine

## 2019-10-02 ENCOUNTER — Telehealth: Payer: Self-pay | Admitting: Emergency Medicine

## 2019-10-02 NOTE — Telephone Encounter (Signed)
Error

## 2019-10-06 DIAGNOSIS — J301 Allergic rhinitis due to pollen: Secondary | ICD-10-CM | POA: Diagnosis not present

## 2019-10-06 DIAGNOSIS — J3081 Allergic rhinitis due to animal (cat) (dog) hair and dander: Secondary | ICD-10-CM | POA: Diagnosis not present

## 2019-10-06 DIAGNOSIS — J3089 Other allergic rhinitis: Secondary | ICD-10-CM | POA: Diagnosis not present

## 2019-10-16 DIAGNOSIS — J3081 Allergic rhinitis due to animal (cat) (dog) hair and dander: Secondary | ICD-10-CM | POA: Diagnosis not present

## 2019-10-16 DIAGNOSIS — J3089 Other allergic rhinitis: Secondary | ICD-10-CM | POA: Diagnosis not present

## 2019-10-16 DIAGNOSIS — J301 Allergic rhinitis due to pollen: Secondary | ICD-10-CM | POA: Diagnosis not present

## 2019-10-20 ENCOUNTER — Telehealth: Payer: Self-pay | Admitting: *Deleted

## 2019-10-20 ENCOUNTER — Telehealth: Payer: Self-pay

## 2019-10-20 ENCOUNTER — Telehealth: Payer: Self-pay | Admitting: Emergency Medicine

## 2019-10-20 DIAGNOSIS — I10 Essential (primary) hypertension: Secondary | ICD-10-CM

## 2019-10-20 MED ORDER — METOPROLOL TARTRATE 25 MG PO TABS: 25.0000 mg | ORAL_TABLET | Freq: Two times a day (BID) | ORAL | 1 refills | Status: DC

## 2019-10-20 NOTE — Telephone Encounter (Signed)
Left message in voice mail Metoprolol 25 mg with 1 refill has been sent to the pharmacy and contact the office to schedule an appointment for additional refills

## 2019-10-20 NOTE — Telephone Encounter (Signed)
No action required.

## 2019-10-20 NOTE — Telephone Encounter (Signed)
Pt called upset saying the pharmacy said they have not received the refill request back that they sent in for her Metoprolol 25 mg.  She is completely out.  Please let patient know the status of the rx refill.

## 2019-10-20 NOTE — Telephone Encounter (Signed)
error 

## 2019-10-23 ENCOUNTER — Telehealth: Payer: Self-pay

## 2019-10-27 ENCOUNTER — Ambulatory Visit: Payer: Medicare PPO | Admitting: Gastroenterology

## 2019-10-29 ENCOUNTER — Telehealth: Payer: Self-pay | Admitting: *Deleted

## 2019-10-29 NOTE — Telephone Encounter (Signed)
Spoke to [employer] Careers adviser) about 12:10 pm concerning the message in [place].  Patient was upset the pharmacy did not receive refill request for Metoprolol 25 mg. I checked the patient's chart before making approving a courtesy refill with one refill. The patient only had three OVs with Dr Mitchel Honour, two were pretaining to HTN. She is a former patient of Dr Brigitte Pulse, the patient needed lab work and last was done 10/11/2017. No future appointment scheduled and patient was advised to return in 3 months from 10/14/2018 visit.

## 2019-11-06 ENCOUNTER — Telehealth: Payer: Self-pay

## 2019-11-06 DIAGNOSIS — J3081 Allergic rhinitis due to animal (cat) (dog) hair and dander: Secondary | ICD-10-CM | POA: Diagnosis not present

## 2019-11-06 DIAGNOSIS — J3089 Other allergic rhinitis: Secondary | ICD-10-CM | POA: Diagnosis not present

## 2019-11-06 DIAGNOSIS — J301 Allergic rhinitis due to pollen: Secondary | ICD-10-CM | POA: Diagnosis not present

## 2019-11-07 ENCOUNTER — Telehealth: Payer: Self-pay

## 2019-11-11 NOTE — Telephone Encounter (Signed)
No action required.

## 2019-11-17 NOTE — Telephone Encounter (Signed)
No ation required at this time, closing encounter from inbox 

## 2019-11-20 DIAGNOSIS — J301 Allergic rhinitis due to pollen: Secondary | ICD-10-CM | POA: Diagnosis not present

## 2019-11-20 DIAGNOSIS — J3089 Other allergic rhinitis: Secondary | ICD-10-CM | POA: Diagnosis not present

## 2019-11-20 DIAGNOSIS — J3081 Allergic rhinitis due to animal (cat) (dog) hair and dander: Secondary | ICD-10-CM | POA: Diagnosis not present

## 2019-11-27 DIAGNOSIS — J301 Allergic rhinitis due to pollen: Secondary | ICD-10-CM | POA: Diagnosis not present

## 2019-11-27 DIAGNOSIS — J3081 Allergic rhinitis due to animal (cat) (dog) hair and dander: Secondary | ICD-10-CM | POA: Diagnosis not present

## 2019-11-27 DIAGNOSIS — J3089 Other allergic rhinitis: Secondary | ICD-10-CM | POA: Diagnosis not present

## 2019-12-12 ENCOUNTER — Other Ambulatory Visit: Payer: Self-pay

## 2019-12-12 ENCOUNTER — Telehealth: Payer: Self-pay

## 2019-12-12 DIAGNOSIS — I10 Essential (primary) hypertension: Secondary | ICD-10-CM

## 2019-12-12 DIAGNOSIS — F411 Generalized anxiety disorder: Secondary | ICD-10-CM

## 2019-12-12 MED ORDER — HYDROCHLOROTHIAZIDE 25 MG PO TABS: 25.0000 mg | ORAL_TABLET | Freq: Every day | ORAL | 3 refills | Status: DC

## 2019-12-12 NOTE — Telephone Encounter (Signed)
Patient is requesting a refill of the following medications: Requested Prescriptions   Pending Prescriptions Disp Refills  . ALPRAZolam (XANAX) 0.25 MG tablet 60 tablet 0    Sig: TAKE 1 TABLET(0.25 MG) BY MOUTH DAILY AS NEEDED FOR ANXIETY    Date of patient request: 12/12/2019 Last office visit: 09/16/2019 Date of last refill: 07/07/2019 Last refill amount: 60 TABLETS

## 2019-12-15 ENCOUNTER — Other Ambulatory Visit: Payer: Self-pay | Admitting: Emergency Medicine

## 2019-12-15 DIAGNOSIS — F411 Generalized anxiety disorder: Secondary | ICD-10-CM

## 2019-12-15 MED ORDER — ALPRAZOLAM 0.25 MG PO TABS: 0.2500 mg | ORAL_TABLET | Freq: Every day | ORAL | 2 refills | Status: DC | PRN

## 2019-12-16 NOTE — Telephone Encounter (Signed)
No additional notes required  

## 2019-12-22 ENCOUNTER — Other Ambulatory Visit: Payer: Self-pay | Admitting: *Deleted

## 2019-12-22 DIAGNOSIS — J309 Allergic rhinitis, unspecified: Secondary | ICD-10-CM

## 2019-12-22 MED ORDER — LORATADINE 10 MG PO TABS: 10.0000 mg | ORAL_TABLET | Freq: Every day | ORAL | 0 refills | Status: DC

## 2019-12-25 DIAGNOSIS — J301 Allergic rhinitis due to pollen: Secondary | ICD-10-CM | POA: Diagnosis not present

## 2019-12-25 DIAGNOSIS — J3081 Allergic rhinitis due to animal (cat) (dog) hair and dander: Secondary | ICD-10-CM | POA: Diagnosis not present

## 2019-12-25 DIAGNOSIS — J3089 Other allergic rhinitis: Secondary | ICD-10-CM | POA: Diagnosis not present

## 2020-01-08 ENCOUNTER — Encounter: Payer: Self-pay | Admitting: Emergency Medicine

## 2020-01-08 ENCOUNTER — Ambulatory Visit: Payer: Medicare PPO | Admitting: Emergency Medicine

## 2020-01-08 ENCOUNTER — Ambulatory Visit (INDEPENDENT_AMBULATORY_CARE_PROVIDER_SITE_OTHER): Payer: Medicare PPO

## 2020-01-08 ENCOUNTER — Other Ambulatory Visit: Payer: Self-pay

## 2020-01-08 VITALS — BP 170/75 | HR 97 | Temp 99.8°F | Resp 16 | Ht 66.0 in | Wt 134.0 lb

## 2020-01-08 DIAGNOSIS — S99921A Unspecified injury of right foot, initial encounter: Secondary | ICD-10-CM | POA: Diagnosis not present

## 2020-01-08 DIAGNOSIS — S92524A Nondisplaced fracture of medial phalanx of right lesser toe(s), initial encounter for closed fracture: Secondary | ICD-10-CM

## 2020-01-08 DIAGNOSIS — S99911A Unspecified injury of right ankle, initial encounter: Secondary | ICD-10-CM | POA: Diagnosis not present

## 2020-01-08 NOTE — Patient Instructions (Addendum)
   If you have lab work done today you will be contacted with your lab results within the next 2 weeks.  If you have not heard from us then please contact us. The fastest way to get your results is to register for My Chart.   IF you received an x-ray today, you will receive an invoice from [facility]. Please contact [facility] at [phone] with questions or concerns regarding your invoice.   IF you received labwork today, you will receive an invoice from [facility]. Please contact [facility] at [phone] with questions or concerns regarding your invoice.   Our billing staff will not be able to assist you with questions regarding bills from these companies.  You will be contacted with the lab results as soon as they are available. The fastest way to get your results is to activate your My Chart account. Instructions are located on the last page of this paperwork. If you have not heard from us regarding the results in 2 weeks, please contact this office.      Toe Fracture A toe fracture is a break in one of the toe bones (phalanges). This may happen if you:  Drop a heavy object on your toe.  Stub your toe.  Twist your toe.  Exercise the same way too much. What are the signs or symptoms? The main symptoms are swelling and pain in the toe. You may also have:  Bruising.  Stiffness.  Numbness.  A change in the way the toe looks.  Broken bones that poke through the skin.  Blood under the toenail. How is this treated? Treatments may include:  Taping the broken toe to a toe that is next to it (buddy taping).  Wearing a shoe that has a wide, rigid sole to protect the toe and to limit its movement.  Wearing a cast.  Surgery. This may be needed if the: ? Pieces of broken bone are out of place. ? Bone pokes through the skin.  Physical therapy. Follow these instructions at home: If you have a shoe:  Wear the shoe as told by your doctor. Remove it  only as told by your doctor.  Loosen the shoe if your toes tingle, become numb, or turn cold and blue.  Keep the shoe clean and dry. If you have a cast:  Do not put pressure on any part of the cast until it is fully hardened. This may take a few hours.  Do not stick anything inside the cast to scratch your skin.  Check the skin around the cast every day. Tell your doctor about any concerns.  You may put lotion on dry skin around the edges of the cast.  Do not put lotion on the skin under the cast.  Keep the cast clean and dry. Bathing  Do not take baths, swim, or use a hot tub until your doctor says it is okay. Ask your doctor if you can take showers.  If the shoe or cast is not waterproof: ? Do not let it get wet. ? Cover it with a watertight covering when you take a bath or a shower. Activity  Do not use your foot to support your body weight until your doctor says it is okay.  Use crutches as told by your doctor.  Ask your doctor what activities are safe for you during recovery.  Avoid activities as told by your doctor.  Do exercises as told by your doctor or therapist. Driving  Do not   drive or use heavy machinery while taking pain medicine.  Do not drive while wearing a cast on a foot that you use for driving. Managing pain, stiffness, and swelling   Put ice on the injured area if told by your doctor: ? Put ice in a plastic bag. ? Place a towel between your skin and the bag.  If you have a shoe, remove it as told by your doctor.  If you have a cast, place a towel between your cast and the bag. ? Leave the ice on for 20 minutes, 2-3 times per day.  Raise (elevate) the injured area above the level of your heart while you are sitting or lying down. General instructions  If your toe was taped to a toe that is next to it, follow your doctor's instructions for changing the gauze and tape. Change it more often: ? If the gauze and tape get wet. If this happens, dry  the space between the toes. ? If the gauze and tape are too tight and they cause your toe to become pale or to lose feeling (go numb).  If your doctor did not give you a protective shoe, wear sturdy shoes that support your foot. Your shoes should not: ? Pinch your toes. ? Fit tightly against your toes.  Do not use any tobacco products, including cigarettes, chewing tobacco, or e-cigarettes. These can delay bone healing. If you need help quitting, ask your doctor.  Take medicines only as told by your doctor.  Keep all follow-up visits as told by your doctor. This is important. Contact a doctor if:  Your pain medicine is not helping.  You have a fever.  You notice a bad smell coming from your cast. Get help right away if:  You lose feeling (have numbness) in your toe or foot, and it is getting worse.  Your toe or your foot tingles.  Your toe or your foot gets cold or turns blue.  You have redness or swelling in your toe or foot, and it is getting worse.  You have very bad pain. Summary  A toe fracture is a break in one of the toe bones.  Use ice and raise your foot. This will help lessen pain and swelling.  Use crutches as told by your doctor. This information is not intended to replace advice given to you by your health care provider. Make sure you discuss any questions you have with your health care provider. Document Revised: 08/30/2017 Document Reviewed: 08/07/2017 Elsevier Patient Education  2020 Elsevier Inc.  

## 2020-01-08 NOTE — Progress Notes (Signed)
Ruth Walker 74 y.o.   Chief Complaint  Patient presents with   Toe Injury    RIGHT on the 3rd toe 2 weeks ago    HISTORY OF PRESENT ILLNESS: This is a 74 y.o. female injured third toe on her right foot 2 weeks ago still having some swelling and occasional pain although better than the first week.  HPI   Prior to Admission medications   Medication Sig Start Date End Date Taking? Authorizing Provider  ALPRAZolam (XANAX) 0.25 MG tablet Take 1 tablet (0.25 mg total) by mouth daily as needed for anxiety. 12/15/19  Yes [place], Ines Bloomer, MD  AMBULATORY NON FORMULARY MEDICATION Medication Name: Nitroglycerin 0.125% gel, apply rectally tid for 6-8 weeks 02/26/19  Yes [place], MD  aspirin 81 MG tablet Take 81 mg by mouth daily.     Yes [provider]  Calcium Carbonate (CALCIUM 600 PO) Take 1 tablet by mouth 2 (two) times daily.   Yes [provider]  cycloSPORINE (RESTASIS) 0.05 % ophthalmic emulsion 1 drop 2 (two) times daily.     Yes [provider]  fluticasone (FLONASE) 50 MCG/ACT nasal spray Place 2 sprays into both nostrils as needed. 09/16/18  Yes Glynn Yepes, Ines Bloomer, MD  hydrochlorothiazide (HYDRODIURIL) 25 MG tablet Take 1 tablet (25 mg total) by mouth daily. 12/12/19 03/11/20 Yes Javante Nilsson, Ines Bloomer, MD  loratadine (CLARITIN) 10 MG tablet Take 1 tablet (10 mg total) by mouth daily. 12/22/19 03/21/20 Yes Omario Ander, Ines Bloomer, MD  metoprolol tartrate (LOPRESSOR) 25 MG tablet Take 1 tablet (25 mg total) by mouth 2 (two) times daily. 10/20/19 01/18/20 Yes Chayce Rullo, Ines Bloomer, MD  polyethylene glycol [facility] Floria Raveling) packet Take 17 g by mouth daily. Reported on 07/10/2015   Yes [provider]  PRESCRIPTION MEDICATION Allergy injection once a week.[facility]   Yes [provider]  Probiotic Product (ALIGN) 4 MG CAPS Take 1 capsule by mouth daily. 07/05/11  Yes Sable Feil, MD  Wheat Dextrin  (BENEFIBER DRINK MIX PO) Take 10 mLs by mouth every morning. Take 2 teaspoons every morning     Yes [provider]  estradiol (ESTRACE) 0.1 MG/GM vaginal cream Place 3.15 Applicatorfuls vaginally 2 (two) times a week. Patient not taking: Reported on 01/08/2020 02/24/15   Roselee Culver, MD  Linaclotide [facility]) 145 MCG CAPS Take 1 capsule by mouth daily. 09/05/11 10/03/11  Sable Feil, MD    Allergies  Allergen Reactions   Aloe    Benadryl [Diphenhydramine Hcl] Other (See Comments)    Makes her feel "hyper"   Cephalexin     REACTION: coarse rash   Ciprofloxacin    Mucinex [Guaifenesin Er] Other (See Comments)    Makes her feel worse   Shellfish Allergy Hives   Sudafed [Pseudoephedrine Hcl] Hives and Other (See Comments)    Itching and hives asthma   Sulfonamide Derivatives     Patient Active Problem List   Diagnosis Date Noted   Uterine polyp    Vertebral compression fracture (HCC)    IBS (irritable bowel syndrome) 09/05/2011   Constipation, slow transit 09/05/2011   Anxiety disorder 09/05/2011   Diverticulosis 11/04/2010   Gallstones 11/04/2010   ANAL FISSURE 05/26/2008   OBSESSIVE-COMPULSIVE PERSONALITY DISORDER 02/28/2008   Essential hypertension 02/28/2008   CYSTITIS, CHRONIC 02/28/2008    Past Medical History:  Diagnosis Date   Allergy    Anal fissure    Anxiety states    Asthma  Gets allergy shots once a week    Cholelithiasis    Depression    Diverticulosis    Environmental allergies    Hemorrhoids    Irritable bowel syndrome (IBS)    Osteoporosis    Skin cancer    Leg   Uterine polyp    Vertebral compression fracture (HCC)     Past Surgical History:  Procedure Laterality Date   BACK SURGERY     CESAREAN SECTION     DILATION AND CURETTAGE OF UTERUS     Endometrial polypectomy     FRACTURE SURGERY     neck (vertebrae)   HEMORRHOID SURGERY     HYSTEROSCOPY     IBS     MOUTH SURGERY      SKIN CANCER EXCISION     Leg     Social History   Socioeconomic History   Marital status: Married    Spouse name: Not on file   Number of children: 2   Years of education: some college   Highest education level: Not on file  Occupational History   Occupation: Retired  Tobacco Use   Smoking status: Never Smoker   Smokeless tobacco: Never Used  Scientific laboratory technician Use: Never used  Substance and Sexual Activity   Alcohol use: No    Alcohol/week: 0.0 standard drinks   Drug use: No   Sexual activity: Yes    Birth control/protection: Post-menopausal    Comment: number of sex partners in the last 69 months  1  Other Topics Concern   Not on file  Social History Narrative   Patient gets regular exercise. Cares for her grandchildren.   Social Determinants of Health   Financial Resource Strain:    Difficulty of Paying Living Expenses:   Food Insecurity:    Worried About Charity fundraiser in the Last Year:    Arboriculturist in the Last Year:   Transportation Needs:    Film/video editor (Medical):    Lack of Transportation (Non-Medical):   Physical Activity:    Days of Exercise per Week:    Minutes of Exercise per Session:   Stress:    Feeling of Stress :   Social Connections:    Frequency of Communication with Friends and Family:    Frequency of Social Gatherings with Friends and Family:    Attends Religious Services:    Active Member of Clubs or Organizations:    Attends Music therapist:    Marital Status:   Intimate Partner Violence:    Fear of Current or Ex-Partner:    Emotionally Abused:    Physically Abused:    Sexually Abused:     Family History  Problem Relation Age of Onset   Diabetes Mother    Hypertension Mother    Thyroid disease Mother    Hypertension Father    Heart disease Father    Thyroid disease Sister    Colon cancer Neg Hx      Review of Systems  Constitutional: Negative.   Negative for chills and fever.  HENT: Negative.  Negative for congestion and sore throat.   Respiratory: Negative.  Negative for cough and shortness of breath.   Cardiovascular: Negative.  Negative for chest pain.  Gastrointestinal: Negative.  Negative for abdominal pain, nausea and vomiting.  Skin: Negative.  Negative for rash.  Neurological: Negative.  Negative for dizziness and headaches.  All other systems reviewed and are negative.  Today's Vitals  01/08/20 1129  BP: (!) 170/75  Pulse: 97  Resp: 16  Temp: 99.8 F (37.7 C)  TempSrc: Temporal  SpO2: 99%  Weight: 134 lb (60.8 kg)  Height: 5\' 6"  (1.676 m)   Body mass index is 21.63 kg/m.   Physical Exam Vitals reviewed.  Constitutional:      Appearance: Normal appearance.  HENT:     Head: Normocephalic.  Eyes:     Extraocular Movements: Extraocular movements intact.  Cardiovascular:     Rate and Rhythm: Normal rate.  Pulmonary:     Effort: Pulmonary effort is normal.  Musculoskeletal:     Comments: Right foot: Third toe: Positive swelling and some tenderness.  No ecchymosis.  Neurovascularly intact  Skin:    General: Skin is warm and dry.  Neurological:     General: No focal deficit present.     Mental Status: She is alert and oriented to person, place, and time.  Psychiatric:        Mood and Affect: Mood normal.        Behavior: Behavior normal.     DG Toe 3rd Right  Result Date: 01/08/2020 CLINICAL DATA:  Third toe injury with possible fracture, initial encounter EXAM: RIGHT THIRD TOE COMPARISON:  None. FINDINGS: Degenerative changes are noted at the first MTP joint. Mild irregularity of the third middle phalanx is noted suspicious for undisplaced fracture. No other fractures are seen. IMPRESSION: Changes suspicious for undisplaced fracture in the third middle phalanx. Electronically Signed   By: Inez Catalina M.D.   On: 01/08/2020 12:51    ASSESSMENT & PLAN: Asucena was seen today for toe injury.  Diagnoses  and all orders for this visit:  Toe injury, right, initial encounter -     DG Toe 3rd Right  Closed nondisplaced fracture of middle phalanx of lesser toe of right foot, initial encounter    Patient Instructions       If you have lab work done today you will be contacted with your lab results within the next 2 weeks.  If you have not heard from [place] then please contact us. The fastest way to get your results is to register for My Chart.   IF you received an x-ray today, you will receive an invoice from [facility]. Please contact [facility] at [phone] with questions or concerns regarding your invoice.   IF you received labwork today, you will receive an invoice from [facility]. Please contact [facility] at [phone] with questions or concerns regarding your invoice.   Our billing staff will not be able to assist you with questions regarding bills from these companies.  You will be contacted with the lab results as soon as they are available. The fastest way to get your results is to activate your My Chart account. Instructions are located on the last page of this paperwork. If you have not heard from [place] regarding the results in 2 weeks, please contact this office.     Toe Fracture A toe fracture is a break in one of the toe bones (phalanges). This may happen if you:  Drop a heavy object on your toe.  Stub your toe.  Twist your toe.  Exercise the same way too much. What are the signs or symptoms? The main symptoms are swelling and pain in the toe. You may also have:  Bruising.  Stiffness.  Numbness.  A change in the way the toe looks.  Broken bones that poke through the skin.  Blood under the  toenail. How is this treated? Treatments may include:  Taping the broken toe to a toe that is next to it (buddy taping).  Wearing a shoe that has a wide, rigid sole to protect the toe and to limit its movement.  Wearing a cast.  Surgery. This may be  needed if the: ? Pieces of broken bone are out of place. ? Bone pokes through the skin.  Physical therapy. Follow these instructions at home: If you have a shoe:  Wear the shoe as told by your doctor. Remove it only as told by your doctor.  Loosen the shoe if your toes tingle, become numb, or turn cold and blue.  Keep the shoe clean and dry. If you have a cast:  Do not put pressure on any part of the cast until it is fully hardened. This may take a few hours.  Do not stick anything inside the cast to scratch your skin.  Check the skin around the cast every day. Tell your doctor about any concerns.  You may put lotion on dry skin around the edges of the cast.  Do not put lotion on the skin under the cast.  Keep the cast clean and dry. Bathing  Do not take baths, swim, or use a hot tub until your doctor says it is okay. Ask your doctor if you can take showers.  If the shoe or cast is not waterproof: ? Do not let it get wet. ? Cover it with a watertight covering when you take a bath or a shower. Activity  Do not use your foot to support your body weight until your doctor says it is okay.  Use crutches as told by your doctor.  Ask your doctor what activities are safe for you during recovery.  Avoid activities as told by your doctor.  Do exercises as told by your doctor or therapist. Driving  Do not drive or use heavy machinery while taking pain medicine.  Do not drive while wearing a cast on a foot that you use for driving. Managing pain, stiffness, and swelling   Put ice on the injured area if told by your doctor: ? Put ice in a plastic bag. ? Place a towel between your skin and the bag.  If you have a shoe, remove it as told by your doctor.  If you have a cast, place a towel between your cast and the bag. ? Leave the ice on for 20 minutes, 2-3 times per day.  Raise (elevate) the injured area above the level of your heart while you are sitting or lying  down. General instructions  If your toe was taped to a toe that is next to it, follow your doctor's instructions for changing the gauze and tape. Change it more often: ? If the gauze and tape get wet. If this happens, dry the space between the toes. ? If the gauze and tape are too tight and they cause your toe to become pale or to lose feeling (go numb).  If your doctor did not give you a protective shoe, wear sturdy shoes that support your foot. Your shoes should not: ? Pinch your toes. ? Fit tightly against your toes.  Do not use any tobacco products, including cigarettes, chewing tobacco, or e-cigarettes. These can delay bone healing. If you need help quitting, ask your doctor.  Take medicines only as told by your doctor.  Keep all follow-up visits as told by your doctor. This is important. Contact a doctor  if:  Your pain medicine is not helping.  You have a fever.  You notice a bad smell coming from your cast. Get help right away if:  You lose feeling (have numbness) in your toe or foot, and it is getting worse.  Your toe or your foot tingles.  Your toe or your foot gets cold or turns blue.  You have redness or swelling in your toe or foot, and it is getting worse.  You have very bad pain. Summary  A toe fracture is a break in one of the toe bones.  Use ice and raise your foot. This will help lessen pain and swelling.  Use crutches as told by your doctor. This information is not intended to replace advice given to you by your health care provider. Make sure you discuss any questions you have with your health care provider. Document Revised: 08/30/2017 Document Reviewed: 08/07/2017 Elsevier Patient Education  2020 Elsevier Inc.      Ruth Caroli, MD [facility]

## 2020-01-20 ENCOUNTER — Other Ambulatory Visit: Payer: Self-pay | Admitting: Emergency Medicine

## 2020-01-20 DIAGNOSIS — J309 Allergic rhinitis, unspecified: Secondary | ICD-10-CM

## 2020-02-12 DIAGNOSIS — J301 Allergic rhinitis due to pollen: Secondary | ICD-10-CM | POA: Diagnosis not present

## 2020-02-12 DIAGNOSIS — J3081 Allergic rhinitis due to animal (cat) (dog) hair and dander: Secondary | ICD-10-CM | POA: Diagnosis not present

## 2020-02-12 DIAGNOSIS — J3089 Other allergic rhinitis: Secondary | ICD-10-CM | POA: Diagnosis not present

## 2020-02-14 ENCOUNTER — Other Ambulatory Visit: Payer: Self-pay | Admitting: Emergency Medicine

## 2020-02-14 DIAGNOSIS — J309 Allergic rhinitis, unspecified: Secondary | ICD-10-CM

## 2020-02-14 NOTE — Telephone Encounter (Signed)
Requested Prescriptions  Pending Prescriptions Disp Refills  . loratadine (CLARITIN) 10 MG tablet [Pharmacy Med Name: LORATADINE 10MG  TABLETS] 30 tablet 0    Sig: TAKE 1 TABLET(10 MG) BY MOUTH DAILY     Ear, Nose, and Throat:  Antihistamines Passed - 02/14/2020  4:43 PM      Passed - Valid encounter within last 12 months    Recent Outpatient Visits          1 month ago Toe injury, right, initial encounter   Primary Care at [facility], Ines Bloomer, MD   5 months ago Anal fissure   Primary Care at [place], Ines Bloomer, MD   1 year ago Essential hypertension   Primary Care at [place], Ines Bloomer, MD   1 year ago Generalized anxiety disorder   Primary Care at [place], Ines Bloomer, MD   1 year ago Fall in home, initial encounter   [facility] at [facility], Ranell Patrick, MD

## 2020-02-19 DIAGNOSIS — J3081 Allergic rhinitis due to animal (cat) (dog) hair and dander: Secondary | ICD-10-CM | POA: Diagnosis not present

## 2020-02-19 DIAGNOSIS — J3089 Other allergic rhinitis: Secondary | ICD-10-CM | POA: Diagnosis not present

## 2020-02-19 DIAGNOSIS — J301 Allergic rhinitis due to pollen: Secondary | ICD-10-CM | POA: Diagnosis not present

## 2020-02-26 DIAGNOSIS — J3081 Allergic rhinitis due to animal (cat) (dog) hair and dander: Secondary | ICD-10-CM | POA: Diagnosis not present

## 2020-02-26 DIAGNOSIS — J301 Allergic rhinitis due to pollen: Secondary | ICD-10-CM | POA: Diagnosis not present

## 2020-02-26 DIAGNOSIS — J3089 Other allergic rhinitis: Secondary | ICD-10-CM | POA: Diagnosis not present

## 2020-03-02 ENCOUNTER — Telehealth: Payer: Self-pay | Admitting: *Deleted

## 2020-03-02 NOTE — Telephone Encounter (Signed)
Schedule AWV.  

## 2020-03-04 DIAGNOSIS — J3081 Allergic rhinitis due to animal (cat) (dog) hair and dander: Secondary | ICD-10-CM | POA: Diagnosis not present

## 2020-03-04 DIAGNOSIS — J3089 Other allergic rhinitis: Secondary | ICD-10-CM | POA: Diagnosis not present

## 2020-03-04 DIAGNOSIS — J301 Allergic rhinitis due to pollen: Secondary | ICD-10-CM | POA: Diagnosis not present

## 2020-03-06 IMAGING — DX DG LUMBAR SPINE COMPLETE 4+V
5 series · 5 of 5 positions shown · non-contrast
Comparison: 01/14/2013

CLINICAL DATA: Fall x2 days

EXAM:
LUMBAR SPINE - COMPLETE 4+ VIEW

[l-spine ap]
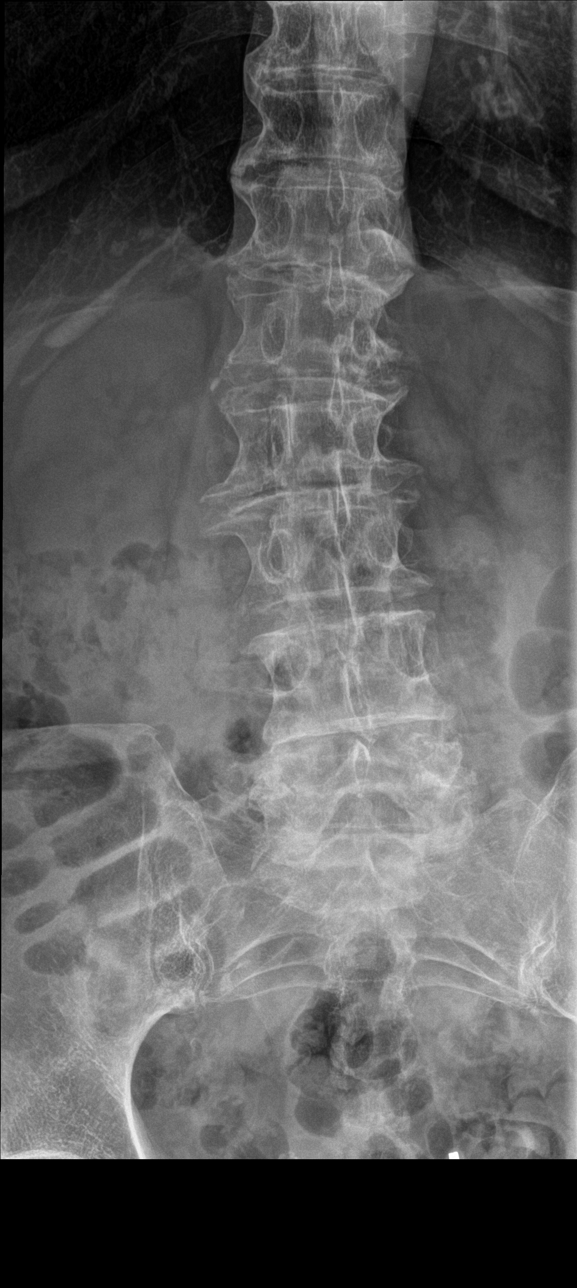

[l-spine obl (1 of 2)]
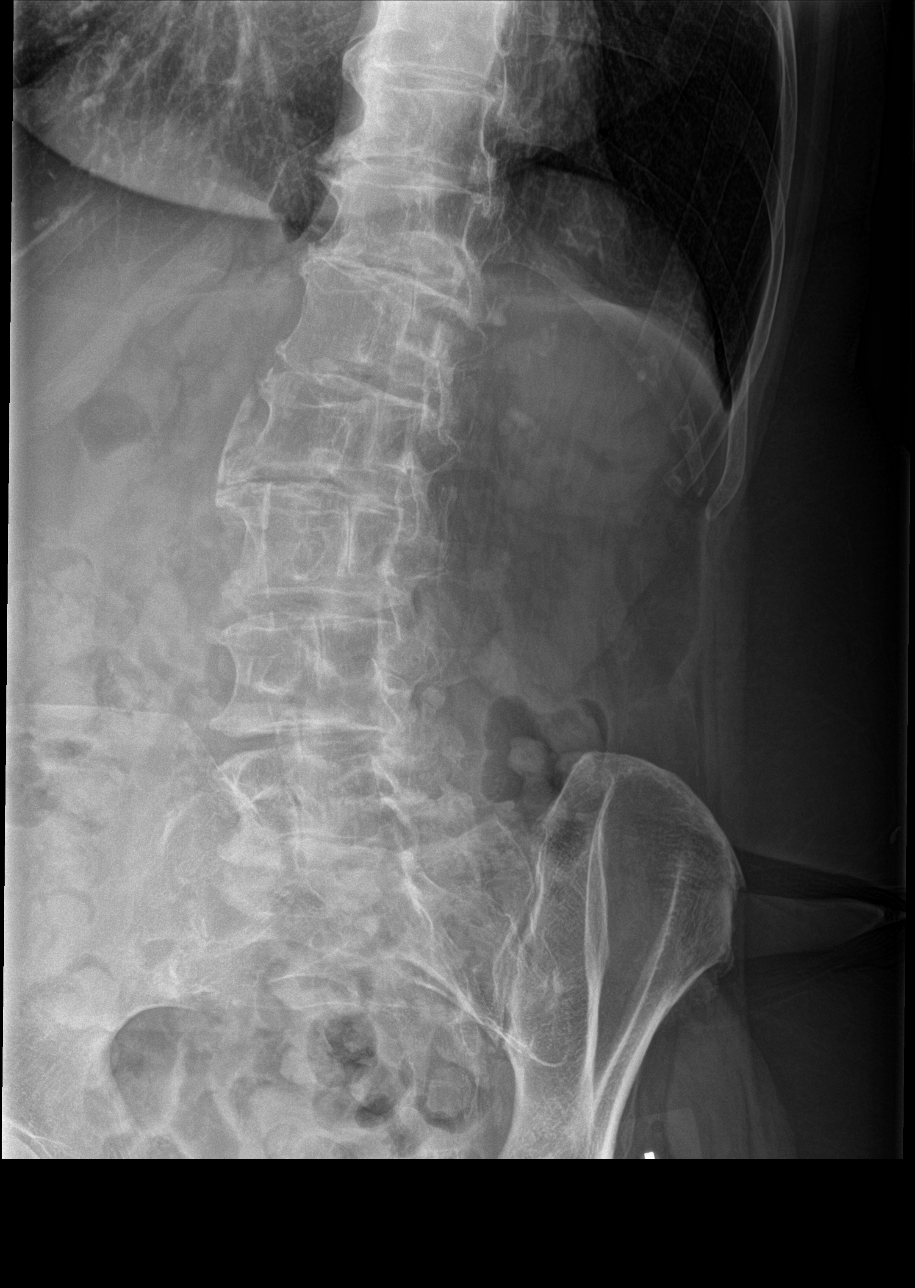

[l-spine obl (2 of 2)]
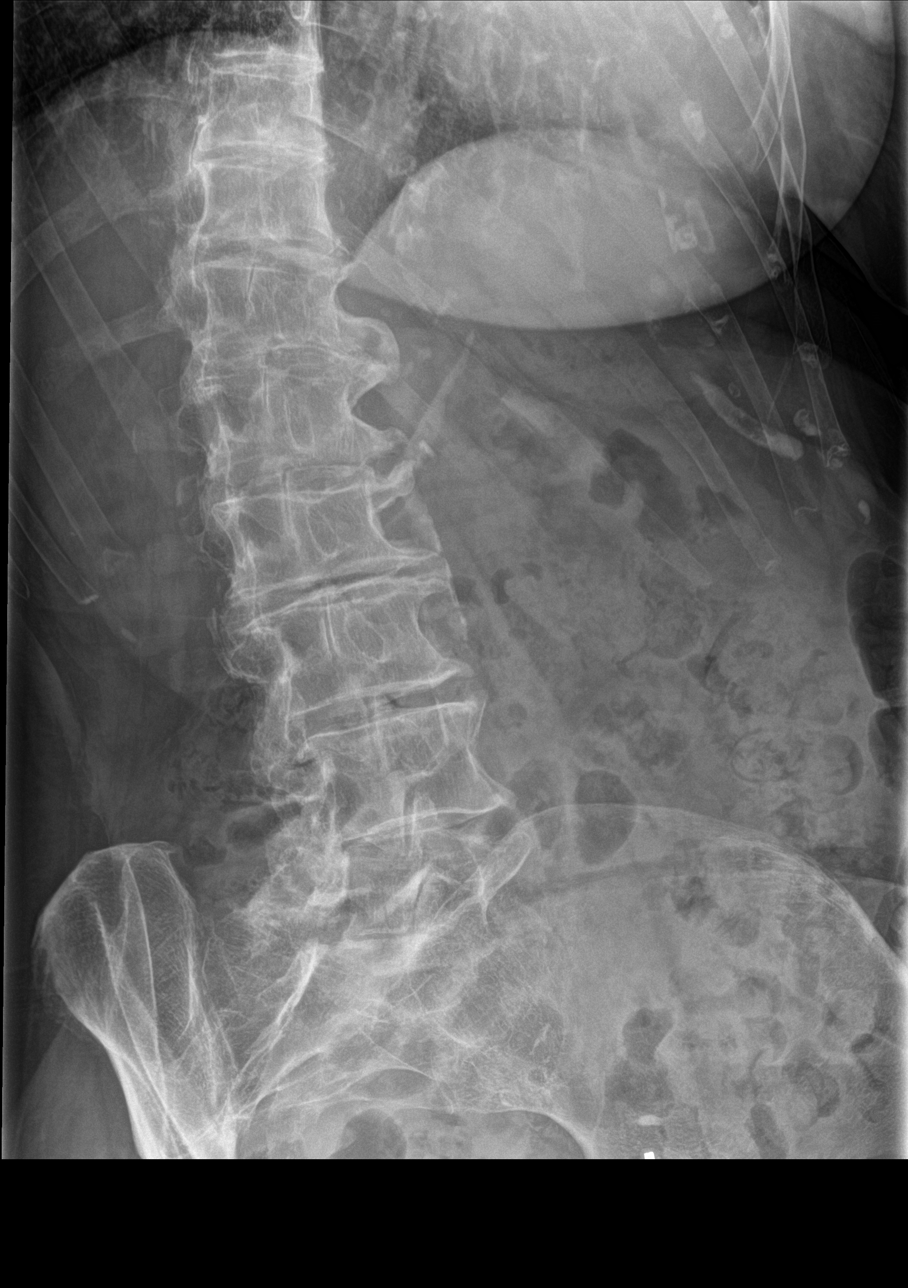

[l-spine lat]
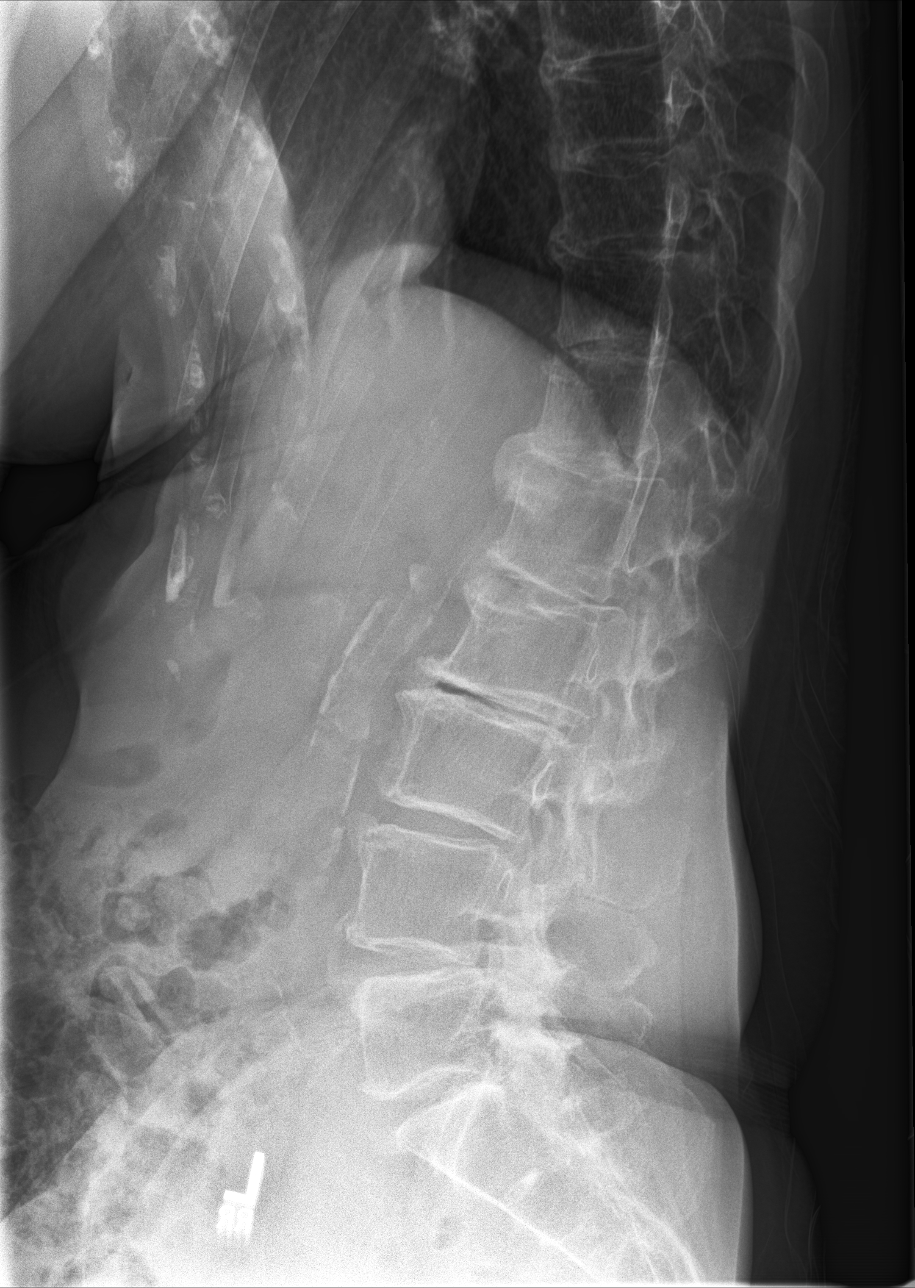

[l-spine l5-s1]
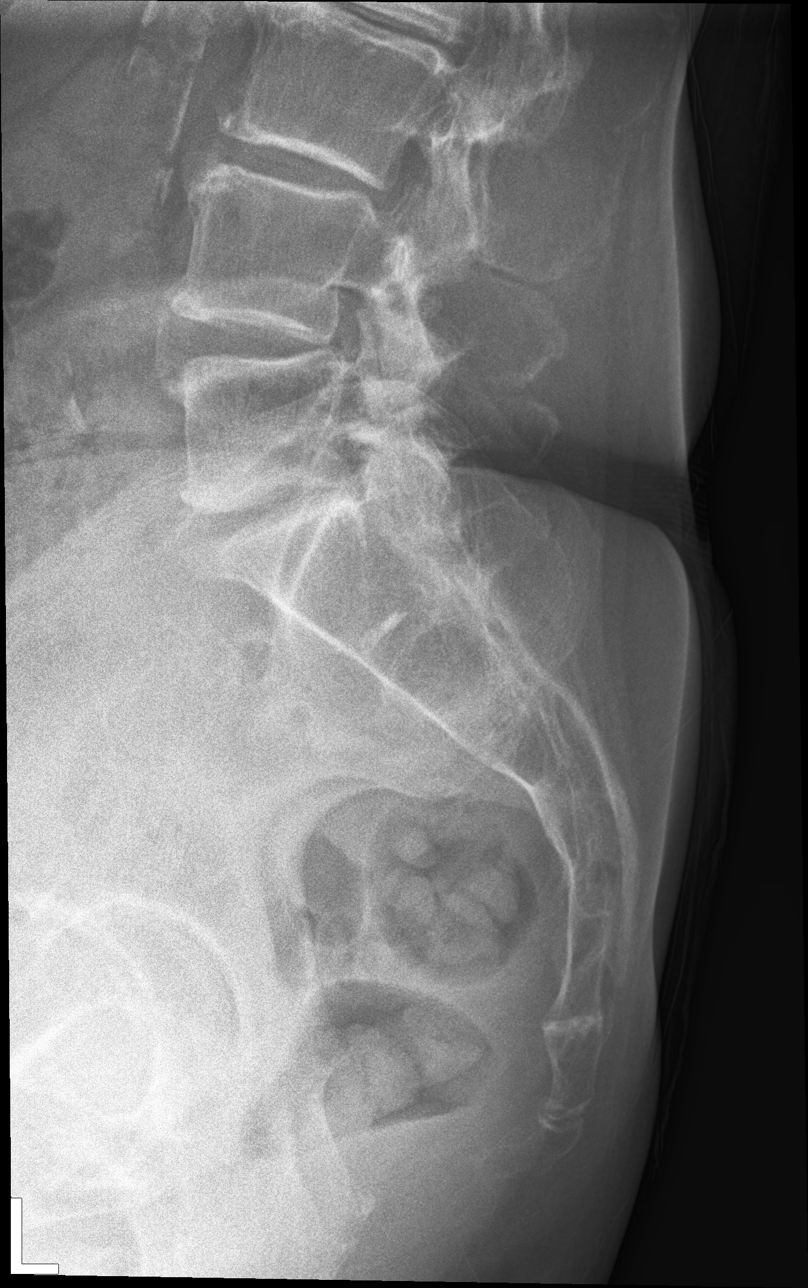

[5 of 5 positions shown; findings below may reference images not displayed]

FINDINGS: Normal lumbar lordosis.  Mild lumbar dextroscoliosis.

No evidence of fracture or dislocation. Vertebral body heights are
maintained.

Mild multilevel degenerative changes.

Visualized bony pelvis appears intact.
IMPRESSION: No fracture or dislocation is seen.

Mild degenerative changes.

## 2020-03-09 DIAGNOSIS — Z1159 Encounter for screening for other viral diseases: Secondary | ICD-10-CM | POA: Diagnosis not present

## 2020-03-09 DIAGNOSIS — Z20822 Contact with and (suspected) exposure to covid-19: Secondary | ICD-10-CM | POA: Diagnosis not present

## 2020-03-18 ENCOUNTER — Other Ambulatory Visit: Payer: Self-pay | Admitting: Emergency Medicine

## 2020-03-18 DIAGNOSIS — J309 Allergic rhinitis, unspecified: Secondary | ICD-10-CM

## 2020-03-19 DIAGNOSIS — J3089 Other allergic rhinitis: Secondary | ICD-10-CM | POA: Diagnosis not present

## 2020-03-19 DIAGNOSIS — J3081 Allergic rhinitis due to animal (cat) (dog) hair and dander: Secondary | ICD-10-CM | POA: Diagnosis not present

## 2020-03-19 DIAGNOSIS — J301 Allergic rhinitis due to pollen: Secondary | ICD-10-CM | POA: Diagnosis not present

## 2020-03-24 DIAGNOSIS — J3081 Allergic rhinitis due to animal (cat) (dog) hair and dander: Secondary | ICD-10-CM | POA: Diagnosis not present

## 2020-03-24 DIAGNOSIS — J301 Allergic rhinitis due to pollen: Secondary | ICD-10-CM | POA: Diagnosis not present

## 2020-03-24 DIAGNOSIS — J3089 Other allergic rhinitis: Secondary | ICD-10-CM | POA: Diagnosis not present

## 2020-03-29 ENCOUNTER — Other Ambulatory Visit: Payer: Self-pay | Admitting: Emergency Medicine

## 2020-03-29 DIAGNOSIS — Z1231 Encounter for screening mammogram for malignant neoplasm of breast: Secondary | ICD-10-CM

## 2020-04-13 ENCOUNTER — Ambulatory Visit
Admission: RE | Admit: 2020-04-13 | Discharge: 2020-04-13 | Disposition: A | Discharge status: Home / Self Care | Payer: Medicare PPO | Source: Ambulatory Visit | Attending: Emergency Medicine | Admitting: Emergency Medicine

## 2020-04-13 ENCOUNTER — Other Ambulatory Visit: Payer: Self-pay

## 2020-04-13 DIAGNOSIS — Z1231 Encounter for screening mammogram for malignant neoplasm of breast: Secondary | ICD-10-CM

## 2020-04-16 DIAGNOSIS — J3081 Allergic rhinitis due to animal (cat) (dog) hair and dander: Secondary | ICD-10-CM | POA: Diagnosis not present

## 2020-04-16 DIAGNOSIS — J301 Allergic rhinitis due to pollen: Secondary | ICD-10-CM | POA: Diagnosis not present

## 2020-04-16 DIAGNOSIS — J3089 Other allergic rhinitis: Secondary | ICD-10-CM | POA: Diagnosis not present

## 2020-04-17 ENCOUNTER — Other Ambulatory Visit: Payer: Self-pay | Admitting: Emergency Medicine

## 2020-04-17 DIAGNOSIS — I10 Essential (primary) hypertension: Secondary | ICD-10-CM

## 2020-04-17 DIAGNOSIS — J309 Allergic rhinitis, unspecified: Secondary | ICD-10-CM

## 2020-04-17 NOTE — Telephone Encounter (Signed)
Requested medication (s) are due for refill today: yes  Requested medication (s) are on the active medication list: yes  Last refill:  10/20/19  Future visit scheduled: no  Notes to clinic:  prescription expired 01/18/20-    Requested Prescriptions  Pending Prescriptions Disp Refills   metoprolol tartrate (LOPRESSOR) 25 MG tablet [Pharmacy Med Name: METOPROLOL TARTRATE 25MG  TABLETS] 180 tablet     Sig: TAKE 1 TABLET(25 MG) BY MOUTH TWICE DAILY      Cardiovascular:  Beta Blockers Failed - 04/17/2020  4:54 PM      Failed - Last BP in normal range    BP Readings from Last 1 Encounters:  01/08/20 (!) 170/75          Passed - Last Heart Rate in normal range    Pulse Readings from Last 1 Encounters:  01/08/20 97          Passed - Valid encounter within last 6 months    Recent Outpatient Visits           3 months ago Toe injury, right, initial encounter   Primary Care at [facility], Ines Bloomer, MD   7 months ago Anal fissure   Primary Care at [place], [place], MD   1 year ago Essential hypertension   Primary Care at [place], Ines Bloomer, MD   1 year ago Generalized anxiety disorder   Primary Care at [place], Ines Bloomer, MD   1 year ago Fall in home, initial encounter   [facility] at [facility], Ranell Patrick, MD               Signed Prescriptions Disp Refills   loratadine (CLARITIN) 10 MG tablet 90 tablet 2    Sig: TAKE 1 TABLET(10 MG) BY MOUTH DAILY      Ear, Nose, and Throat:  Antihistamines Passed - 04/17/2020  4:54 PM      Passed - Valid encounter within last 12 months    Recent Outpatient Visits           3 months ago Toe injury, right, initial encounter   Primary Care at [facility], Ines Bloomer, MD   7 months ago Anal fissure   Primary Care at [place], Ines Bloomer, MD   1 year ago Essential hypertension   Primary Care at [place], Ines Bloomer, MD   1 year ago Generalized anxiety disorder   Primary  Care at [place], Ines Bloomer, MD   1 year ago Fall in home, initial encounter   Primary [facility] at [facility], Ranell Patrick, MD

## 2020-04-17 NOTE — Telephone Encounter (Signed)
Requested Prescriptions  Pending Prescriptions Disp Refills  . loratadine (CLARITIN) 10 MG tablet [Pharmacy Med Name: LORATADINE 10MG  TABLETS] 90 tablet 2    Sig: TAKE 1 TABLET(10 MG) BY MOUTH DAILY     Ear, Nose, and Throat:  Antihistamines Passed - 04/17/2020  4:54 PM      Passed - Valid encounter within last 12 months    Recent Outpatient Visits          3 months ago Toe injury, right, initial encounter   Primary Care at [facility], Ines Bloomer, MD   7 months ago Anal fissure   Primary Care at [place], Ines Bloomer, MD   1 year ago Essential hypertension   Primary Care at [place], Ines Bloomer, MD   1 year ago Generalized anxiety disorder   Primary Care at [place], Ines Bloomer, MD   1 year ago Fall in home, initial encounter   [facility] at [facility], Ranell Patrick, MD             . metoprolol tartrate (LOPRESSOR) 25 MG tablet [Pharmacy Med Name: METOPROLOL TARTRATE 25MG  TABLETS] 180 tablet     Sig: TAKE 1 TABLET(25 MG) BY MOUTH TWICE DAILY     Cardiovascular:  Beta Blockers Failed - 04/17/2020  4:54 PM      Failed - Last BP in normal range    BP Readings from Last 1 Encounters:  01/08/20 (!) 170/75         Passed - Last Heart Rate in normal range    Pulse Readings from Last 1 Encounters:  01/08/20 97         Passed - Valid encounter within last 6 months    Recent Outpatient Visits          3 months ago Toe injury, right, initial encounter   Primary Care at [facility], Ines Bloomer, MD   7 months ago Anal fissure   Primary Care at [place], Ines Bloomer, MD   1 year ago Essential hypertension   Primary Care at [place], Ines Bloomer, MD   1 year ago Generalized anxiety disorder   Primary Care at [place], Ines Bloomer, MD   1 year ago Fall in home, initial encounter   [facility] at [facility], Ranell Patrick, MD

## 2020-04-23 DIAGNOSIS — J3081 Allergic rhinitis due to animal (cat) (dog) hair and dander: Secondary | ICD-10-CM | POA: Diagnosis not present

## 2020-04-23 DIAGNOSIS — J301 Allergic rhinitis due to pollen: Secondary | ICD-10-CM | POA: Diagnosis not present

## 2020-04-23 DIAGNOSIS — J3089 Other allergic rhinitis: Secondary | ICD-10-CM | POA: Diagnosis not present

## 2020-04-29 DIAGNOSIS — J3081 Allergic rhinitis due to animal (cat) (dog) hair and dander: Secondary | ICD-10-CM | POA: Diagnosis not present

## 2020-04-29 DIAGNOSIS — J3089 Other allergic rhinitis: Secondary | ICD-10-CM | POA: Diagnosis not present

## 2020-04-29 DIAGNOSIS — J301 Allergic rhinitis due to pollen: Secondary | ICD-10-CM | POA: Diagnosis not present

## 2020-05-07 DIAGNOSIS — J3081 Allergic rhinitis due to animal (cat) (dog) hair and dander: Secondary | ICD-10-CM | POA: Diagnosis not present

## 2020-05-07 DIAGNOSIS — J3089 Other allergic rhinitis: Secondary | ICD-10-CM | POA: Diagnosis not present

## 2020-05-07 DIAGNOSIS — J301 Allergic rhinitis due to pollen: Secondary | ICD-10-CM | POA: Diagnosis not present

## 2020-05-17 ENCOUNTER — Ambulatory Visit: Payer: Medicare PPO

## 2020-05-18 ENCOUNTER — Ambulatory Visit
Admission: RE | Admit: 2020-05-18 | Discharge: 2020-05-18 | Disposition: A | Discharge status: Home / Self Care | Payer: Medicare PPO | Source: Ambulatory Visit | Attending: Emergency Medicine | Admitting: Emergency Medicine

## 2020-05-18 ENCOUNTER — Other Ambulatory Visit: Payer: Self-pay

## 2020-05-18 DIAGNOSIS — Z1231 Encounter for screening mammogram for malignant neoplasm of breast: Secondary | ICD-10-CM | POA: Diagnosis not present

## 2020-05-21 DIAGNOSIS — J3081 Allergic rhinitis due to animal (cat) (dog) hair and dander: Secondary | ICD-10-CM | POA: Diagnosis not present

## 2020-05-21 DIAGNOSIS — J301 Allergic rhinitis due to pollen: Secondary | ICD-10-CM | POA: Diagnosis not present

## 2020-05-21 DIAGNOSIS — J3089 Other allergic rhinitis: Secondary | ICD-10-CM | POA: Diagnosis not present

## 2020-05-26 ENCOUNTER — Telehealth: Payer: Self-pay | Admitting: Emergency Medicine

## 2020-05-26 NOTE — Telephone Encounter (Signed)
Pt is wanting a phone call with recent mammogram results /

## 2020-05-26 NOTE — Telephone Encounter (Signed)
Pt called and stated she would like the results to her mammogram from [place]. Pt stated she will continue to call our office to get results because she goes to watch her grandchildren. Please advise.

## 2020-05-27 NOTE — Telephone Encounter (Signed)
Spoke with pt regarding mammo results, pt pleased with results. No other action needed

## 2020-06-18 ENCOUNTER — Other Ambulatory Visit: Payer: Self-pay | Admitting: Emergency Medicine

## 2020-06-18 DIAGNOSIS — J3081 Allergic rhinitis due to animal (cat) (dog) hair and dander: Secondary | ICD-10-CM | POA: Diagnosis not present

## 2020-06-18 DIAGNOSIS — J3089 Other allergic rhinitis: Secondary | ICD-10-CM | POA: Diagnosis not present

## 2020-06-18 DIAGNOSIS — F411 Generalized anxiety disorder: Secondary | ICD-10-CM

## 2020-06-18 DIAGNOSIS — J301 Allergic rhinitis due to pollen: Secondary | ICD-10-CM | POA: Diagnosis not present

## 2020-06-18 NOTE — Telephone Encounter (Signed)
Requested medication (s) are due for refill today:   Provider to determine  Requested medication (s) are on the active medication list:   Yes  Future visit scheduled:   No   Last ordered: 12/15/2019 #30, 2 refills  Non delegated refill   Requested Prescriptions  Pending Prescriptions Disp Refills   ALPRAZolam (XANAX) 0.25 MG tablet [Pharmacy Med Name: ALPRAZOLAM 0.25MG  TABLETS] 30 tablet     Sig: TAKE 1 TABLET(0.25 MG) BY MOUTH DAILY AS NEEDED FOR ANXIETY      Not Delegated - Psychiatry:  Anxiolytics/Hypnotics Failed - 06/18/2020 12:11 PM      Failed - This refill cannot be delegated      Failed - Urine Drug Screen completed in last 360 days      Passed - Valid encounter within last 6 months    Recent Outpatient Visits           5 months ago Toe injury, right, initial encounter   Primary Care at [facility], Ines Bloomer, MD   9 months ago Anal fissure   Primary Care at [place], [place], MD   1 year ago Essential hypertension   Primary Care at [place], Ines Bloomer, MD   1 year ago Generalized anxiety disorder   Primary Care at [place], Ines Bloomer, MD   2 years ago Fall in home, initial encounter   Primary Care at [facility], Ranell Patrick, MD

## 2020-06-18 NOTE — Telephone Encounter (Signed)
Patient is requesting a refill of the following medications: Requested Prescriptions   Pending Prescriptions Disp Refills   ALPRAZolam (XANAX) 0.25 MG tablet [Pharmacy Med Name: ALPRAZOLAM 0.25MG  TABLETS] 30 tablet     Sig: TAKE 1 TABLET(0.25 MG) BY MOUTH DAILY AS NEEDED FOR ANXIETY    Date of patient request: 06/18/2020 Last office visit: 01/08/2020 Date of last refill: 12/15/2019 Last refill amount: 30 x2 refills

## 2020-06-27 DIAGNOSIS — Z20822 Contact with and (suspected) exposure to covid-19: Secondary | ICD-10-CM | POA: Diagnosis not present

## 2020-07-23 DIAGNOSIS — J3081 Allergic rhinitis due to animal (cat) (dog) hair and dander: Secondary | ICD-10-CM | POA: Diagnosis not present

## 2020-07-23 DIAGNOSIS — J301 Allergic rhinitis due to pollen: Secondary | ICD-10-CM | POA: Diagnosis not present

## 2020-07-23 DIAGNOSIS — J3089 Other allergic rhinitis: Secondary | ICD-10-CM | POA: Diagnosis not present

## 2020-08-17 DIAGNOSIS — J3081 Allergic rhinitis due to animal (cat) (dog) hair and dander: Secondary | ICD-10-CM | POA: Diagnosis not present

## 2020-08-17 DIAGNOSIS — J3089 Other allergic rhinitis: Secondary | ICD-10-CM | POA: Diagnosis not present

## 2020-08-17 DIAGNOSIS — J301 Allergic rhinitis due to pollen: Secondary | ICD-10-CM | POA: Diagnosis not present

## 2020-09-06 DIAGNOSIS — J3081 Allergic rhinitis due to animal (cat) (dog) hair and dander: Secondary | ICD-10-CM | POA: Diagnosis not present

## 2020-09-06 DIAGNOSIS — J301 Allergic rhinitis due to pollen: Secondary | ICD-10-CM | POA: Diagnosis not present

## 2020-09-06 DIAGNOSIS — J3089 Other allergic rhinitis: Secondary | ICD-10-CM | POA: Diagnosis not present

## 2020-09-13 DIAGNOSIS — J301 Allergic rhinitis due to pollen: Secondary | ICD-10-CM | POA: Diagnosis not present

## 2020-09-13 DIAGNOSIS — J3089 Other allergic rhinitis: Secondary | ICD-10-CM | POA: Diagnosis not present

## 2020-09-30 ENCOUNTER — Other Ambulatory Visit: Payer: Self-pay | Admitting: Emergency Medicine

## 2020-09-30 DIAGNOSIS — I10 Essential (primary) hypertension: Secondary | ICD-10-CM

## 2020-09-30 NOTE — Telephone Encounter (Signed)
Requested medication (s) are due for refill today: yes  Requested medication (s) are on the active medication list: yes  Last refill: 09/12/20  Future visit scheduled: no  Notes to clinic:  no valid encounter within last 6 months    Requested Prescriptions  Pending Prescriptions Disp Refills   hydrochlorothiazide (HYDRODIURIL) 25 MG tablet [Pharmacy Med Name: HYDROCHLOROTHIAZIDE 25MG  TABLETS] 90 tablet 3    Sig: TAKE 1 TABLET(25 MG) BY MOUTH DAILY      Cardiovascular: Diuretics - Thiazide Failed - 09/30/2020  2:21 PM      Failed - Ca in normal range and within 360 days    Calcium  Date Value Ref Range Status  10/11/2017 10.9 (H) 8.7 - 10.3 mg/dL Final          Failed - Cr in normal range and within 360 days    Creat  Date Value Ref Range Status  09/23/2014 0.70 0.50 - 1.10 mg/dL Final   Creatinine, Ser  Date Value Ref Range Status  10/11/2017 0.77 0.57 - 1.00 mg/dL Final          Failed - K in normal range and within 360 days    Potassium  Date Value Ref Range Status  10/11/2017 4.3 3.5 - 5.2 mmol/L Final          Failed - Na in normal range and within 360 days    Sodium  Date Value Ref Range Status  10/11/2017 140 134 - 144 mmol/L Final          Failed - Last BP in normal range    BP Readings from Last 1 Encounters:  01/08/20 (!) 170/75          Failed - Valid encounter within last 6 months    Recent Outpatient Visits           8 months ago Toe injury, right, initial encounter   Primary Care at [facility], Ines Bloomer, MD   1 year ago Anal fissure   Primary Care at [place], Ines Bloomer, MD   1 year ago Essential hypertension   Primary Care at [place], Ines Bloomer, MD   2 years ago Generalized anxiety disorder   Primary Care at [place], Ines Bloomer, MD   2 years ago Fall in home, initial encounter   [facility] at [facility], Ranell Patrick, MD

## 2020-09-30 NOTE — Telephone Encounter (Signed)
No further refills without office visit 

## 2020-10-08 DIAGNOSIS — J301 Allergic rhinitis due to pollen: Secondary | ICD-10-CM | POA: Diagnosis not present

## 2020-10-08 DIAGNOSIS — J3089 Other allergic rhinitis: Secondary | ICD-10-CM | POA: Diagnosis not present

## 2020-10-08 DIAGNOSIS — J3081 Allergic rhinitis due to animal (cat) (dog) hair and dander: Secondary | ICD-10-CM | POA: Diagnosis not present

## 2020-10-11 ENCOUNTER — Other Ambulatory Visit: Payer: Self-pay | Admitting: Emergency Medicine

## 2020-10-11 DIAGNOSIS — I10 Essential (primary) hypertension: Secondary | ICD-10-CM

## 2020-10-19 DIAGNOSIS — J3081 Allergic rhinitis due to animal (cat) (dog) hair and dander: Secondary | ICD-10-CM | POA: Diagnosis not present

## 2020-10-19 DIAGNOSIS — J301 Allergic rhinitis due to pollen: Secondary | ICD-10-CM | POA: Diagnosis not present

## 2020-10-19 DIAGNOSIS — J3089 Other allergic rhinitis: Secondary | ICD-10-CM | POA: Diagnosis not present

## 2020-11-03 DIAGNOSIS — J3081 Allergic rhinitis due to animal (cat) (dog) hair and dander: Secondary | ICD-10-CM | POA: Diagnosis not present

## 2020-11-03 DIAGNOSIS — J3089 Other allergic rhinitis: Secondary | ICD-10-CM | POA: Diagnosis not present

## 2020-11-03 DIAGNOSIS — J301 Allergic rhinitis due to pollen: Secondary | ICD-10-CM | POA: Diagnosis not present

## 2020-12-16 ENCOUNTER — Other Ambulatory Visit: Payer: Self-pay | Admitting: Emergency Medicine

## 2020-12-16 DIAGNOSIS — F411 Generalized anxiety disorder: Secondary | ICD-10-CM

## 2020-12-27 ENCOUNTER — Telehealth: Payer: Self-pay | Admitting: Gastroenterology

## 2020-12-27 NOTE — Telephone Encounter (Signed)
Pt states she is having issues with her fissures again. She had stopped taking her benefiber and took miralax and states she got herself messed up. Pt requested an appt before the end of the week but there are not any available. Pt states she is going out of town Friday and asking for a refill on her nitro ointment. Refill sent to the pharmacy. Pt states she is going to call back after she goes out of town to set up her colonoscopy.

## 2020-12-27 NOTE — Telephone Encounter (Signed)
Inbound from pt requesting a call back stating that she is having rectal bleeding. Please advise. Thanks

## 2020-12-29 ENCOUNTER — Other Ambulatory Visit: Payer: Self-pay | Admitting: Emergency Medicine

## 2020-12-29 DIAGNOSIS — I10 Essential (primary) hypertension: Secondary | ICD-10-CM

## 2021-01-04 ENCOUNTER — Ambulatory Visit: Payer: Medicare PPO | Admitting: Emergency Medicine

## 2021-01-17 ENCOUNTER — Telehealth: Payer: Self-pay | Admitting: Emergency Medicine

## 2021-01-17 NOTE — Telephone Encounter (Signed)
No concerns if it was a double dose of Xanax 0.25 mg.  Thanks.

## 2021-01-17 NOTE — Telephone Encounter (Signed)
[facility] FYI :  ---Caller states she took a double dose of her medication xanax 25 mg x 30 min ago no s/s of side effects she is still wide awake no shortness of breath  Advised to call poison center

## 2021-01-18 NOTE — Telephone Encounter (Signed)
Message left with husband: pt had concerns of medication she took, however, Dr Mitchel Honour said everything should be fine.  Husband verb understanding.

## 2021-01-19 NOTE — Progress Notes (Deleted)
     01/19/2021 Ruth Walker [id number] September 15, 1945   Chief Complaint:  History of Present Illness: Ruth Walker. Ruth Walker is a 75 year old female with a past medical history of anxiety, depression, IBS.  She is followed by Dr. Tarri Walker. She presents today for further evaluation regarding an anal fissure with rectal bleeding. She contacted Dr. Tarri Walker on 12/27/2020 with complaints of anal fissure pain and bleeding. She restarted NGT fissure ointment. She underwent a colonoscopy by Dr. Sharlett Walker on 07/12/2001 which showed internal hemorrhoids, no polyps and on 09/07/2008 which showed diverticulosis to the sigmoid colon otherwise was normal.   Past Medical History:  Diagnosis Date   Allergy    Anal fissure    Anxiety states    Asthma    Gets allergy shots once a week    Cholelithiasis    Depression    Diverticulosis    Environmental allergies    Hemorrhoids    Irritable bowel syndrome (IBS)    Osteoporosis    Skin cancer    Leg   Uterine polyp    Vertebral compression fracture (HCC)    Past Surgical History:  Procedure Laterality Date   BACK SURGERY     CESAREAN SECTION     DILATION AND CURETTAGE OF UTERUS     Endometrial polypectomy     FRACTURE SURGERY     neck (vertebrae)   HEMORRHOID SURGERY     HYSTEROSCOPY     IBS     MOUTH SURGERY     SKIN CANCER EXCISION     Leg       Current Medications, Allergies, Past Medical History, Past Surgical History, Family History and Social History were reviewed in [employer] record.   Review of Systems:   Constitutional: Negative for fever, sweats, chills or weight loss.  Respiratory: Negative for shortness of breath.   Cardiovascular: Negative for chest pain, palpitations and leg swelling.  Gastrointestinal: See HPI.  Musculoskeletal: Negative for back pain or muscle aches.  Neurological: Negative for dizziness, headaches or paresthesias.    Physical Exam: There were no vitals taken for this  visit. General: Well developed, w   ***female in no acute distress. Head: Normocephalic and atraumatic. Eyes: No scleral icterus. Conjunctiva pink . Ears: Normal auditory acuity. Mouth: Dentition intact. No ulcers or lesions.  Lungs: Clear throughout to auscultation. Heart: Regular rate and rhythm, no murmur. Abdomen: Soft, nontender and nondistended. No masses or hepatomegaly. Normal bowel sounds x 4 quadrants.  Rectal: *** Musculoskeletal: Symmetrical with no gross deformities. Extremities: No edema. Neurological: Alert oriented x 4. No focal deficits.  Psychological: Alert and cooperative. Normal mood and affect  Assessment and Recommendations: 1. 75 year  old female with a recurrent anal fissure with rectal bleeding -Colonoscopy  -CBC  2. IBS

## 2021-01-20 ENCOUNTER — Ambulatory Visit: Payer: Medicare PPO | Admitting: Nurse Practitioner

## 2021-02-18 DIAGNOSIS — J3081 Allergic rhinitis due to animal (cat) (dog) hair and dander: Secondary | ICD-10-CM | POA: Diagnosis not present

## 2021-02-18 DIAGNOSIS — J301 Allergic rhinitis due to pollen: Secondary | ICD-10-CM | POA: Diagnosis not present

## 2021-02-18 DIAGNOSIS — J3089 Other allergic rhinitis: Secondary | ICD-10-CM | POA: Diagnosis not present

## 2021-03-02 DIAGNOSIS — J3089 Other allergic rhinitis: Secondary | ICD-10-CM | POA: Diagnosis not present

## 2021-03-02 DIAGNOSIS — J3081 Allergic rhinitis due to animal (cat) (dog) hair and dander: Secondary | ICD-10-CM | POA: Diagnosis not present

## 2021-03-02 DIAGNOSIS — J301 Allergic rhinitis due to pollen: Secondary | ICD-10-CM | POA: Diagnosis not present

## 2021-03-23 DIAGNOSIS — J3081 Allergic rhinitis due to animal (cat) (dog) hair and dander: Secondary | ICD-10-CM | POA: Diagnosis not present

## 2021-03-23 DIAGNOSIS — J3089 Other allergic rhinitis: Secondary | ICD-10-CM | POA: Diagnosis not present

## 2021-03-23 DIAGNOSIS — J301 Allergic rhinitis due to pollen: Secondary | ICD-10-CM | POA: Diagnosis not present

## 2021-04-01 DIAGNOSIS — S91301A Unspecified open wound, right foot, initial encounter: Secondary | ICD-10-CM | POA: Diagnosis not present

## 2021-04-01 DIAGNOSIS — T798XXA Other early complications of trauma, initial encounter: Secondary | ICD-10-CM | POA: Diagnosis not present

## 2021-04-04 ENCOUNTER — Other Ambulatory Visit: Payer: Self-pay | Admitting: Emergency Medicine

## 2021-04-04 ENCOUNTER — Telehealth: Payer: Self-pay | Admitting: Emergency Medicine

## 2021-04-04 DIAGNOSIS — F411 Generalized anxiety disorder: Secondary | ICD-10-CM

## 2021-04-04 MED ORDER — ALPRAZOLAM 0.25 MG PO TABS: 0.2500 mg | ORAL_TABLET | Freq: Every day | ORAL | 0 refills | Status: DC | PRN

## 2021-04-04 NOTE — Telephone Encounter (Signed)
New prescription sent to pharmacy of record

## 2021-04-04 NOTE — Telephone Encounter (Signed)
Patient says she misplaced her ALPRAZolam Duanne Moron) 0.25 MG tablet over the weekend while at her sisters house  Patient has looked around & has been unable to find it   Wants to know if provider is willing to call in an early refill for her or at least call in a couple pills until she is able to refill again  Says she takes them twice a week & needs it filled before thursday  Pharmacy: [place], [place] - [address]  Phone:  [phone] Fax:  [phone]

## 2021-04-05 NOTE — Telephone Encounter (Signed)
Patient says she called pharmacy to pick up new rx provider sent in & they advised her that they could not fill rx unless provider or nurse calls in to give verbal okay  Please contact pharmacy & follow up w/ patient she is very concerned that she needs this medication before her next dose on Thursday

## 2021-04-06 NOTE — Telephone Encounter (Signed)
[place] to give verbal ok for pt to receive xanax prescription. Called and spoke with patient to inform her that her medication was called into pharmacy.

## 2021-04-10 ENCOUNTER — Other Ambulatory Visit: Payer: Self-pay | Admitting: Emergency Medicine

## 2021-04-10 DIAGNOSIS — I10 Essential (primary) hypertension: Secondary | ICD-10-CM

## 2021-04-11 DIAGNOSIS — S91301D Unspecified open wound, right foot, subsequent encounter: Secondary | ICD-10-CM | POA: Diagnosis not present

## 2021-04-20 DIAGNOSIS — J3081 Allergic rhinitis due to animal (cat) (dog) hair and dander: Secondary | ICD-10-CM | POA: Diagnosis not present

## 2021-04-20 DIAGNOSIS — J3089 Other allergic rhinitis: Secondary | ICD-10-CM | POA: Diagnosis not present

## 2021-04-20 DIAGNOSIS — J301 Allergic rhinitis due to pollen: Secondary | ICD-10-CM | POA: Diagnosis not present

## 2021-05-03 DIAGNOSIS — Z23 Encounter for immunization: Secondary | ICD-10-CM | POA: Diagnosis not present

## 2021-05-03 DIAGNOSIS — J3081 Allergic rhinitis due to animal (cat) (dog) hair and dander: Secondary | ICD-10-CM | POA: Diagnosis not present

## 2021-05-03 DIAGNOSIS — J3089 Other allergic rhinitis: Secondary | ICD-10-CM | POA: Diagnosis not present

## 2021-05-03 DIAGNOSIS — J301 Allergic rhinitis due to pollen: Secondary | ICD-10-CM | POA: Diagnosis not present

## 2021-05-16 DIAGNOSIS — J3089 Other allergic rhinitis: Secondary | ICD-10-CM | POA: Diagnosis not present

## 2021-05-16 DIAGNOSIS — J301 Allergic rhinitis due to pollen: Secondary | ICD-10-CM | POA: Diagnosis not present

## 2021-05-16 DIAGNOSIS — J3081 Allergic rhinitis due to animal (cat) (dog) hair and dander: Secondary | ICD-10-CM | POA: Diagnosis not present

## 2021-05-31 ENCOUNTER — Other Ambulatory Visit: Payer: Self-pay | Admitting: Emergency Medicine

## 2021-05-31 DIAGNOSIS — I10 Essential (primary) hypertension: Secondary | ICD-10-CM

## 2021-06-16 DIAGNOSIS — J3081 Allergic rhinitis due to animal (cat) (dog) hair and dander: Secondary | ICD-10-CM | POA: Diagnosis not present

## 2021-06-16 DIAGNOSIS — J3089 Other allergic rhinitis: Secondary | ICD-10-CM | POA: Diagnosis not present

## 2021-06-16 DIAGNOSIS — J301 Allergic rhinitis due to pollen: Secondary | ICD-10-CM | POA: Diagnosis not present

## 2021-06-23 DIAGNOSIS — J3089 Other allergic rhinitis: Secondary | ICD-10-CM | POA: Diagnosis not present

## 2021-06-23 DIAGNOSIS — J301 Allergic rhinitis due to pollen: Secondary | ICD-10-CM | POA: Diagnosis not present

## 2021-06-23 DIAGNOSIS — J3081 Allergic rhinitis due to animal (cat) (dog) hair and dander: Secondary | ICD-10-CM | POA: Diagnosis not present

## 2021-06-27 ENCOUNTER — Other Ambulatory Visit: Payer: Self-pay | Admitting: Emergency Medicine

## 2021-06-27 DIAGNOSIS — I10 Essential (primary) hypertension: Secondary | ICD-10-CM

## 2021-06-30 DIAGNOSIS — J301 Allergic rhinitis due to pollen: Secondary | ICD-10-CM | POA: Diagnosis not present

## 2021-06-30 DIAGNOSIS — J3081 Allergic rhinitis due to animal (cat) (dog) hair and dander: Secondary | ICD-10-CM | POA: Diagnosis not present

## 2021-06-30 DIAGNOSIS — J3089 Other allergic rhinitis: Secondary | ICD-10-CM | POA: Diagnosis not present

## 2021-07-14 DIAGNOSIS — J3089 Other allergic rhinitis: Secondary | ICD-10-CM | POA: Diagnosis not present

## 2021-07-14 DIAGNOSIS — J301 Allergic rhinitis due to pollen: Secondary | ICD-10-CM | POA: Diagnosis not present

## 2021-07-14 DIAGNOSIS — J3081 Allergic rhinitis due to animal (cat) (dog) hair and dander: Secondary | ICD-10-CM | POA: Diagnosis not present

## 2021-07-16 ENCOUNTER — Other Ambulatory Visit: Payer: Self-pay | Admitting: Emergency Medicine

## 2021-07-16 DIAGNOSIS — I10 Essential (primary) hypertension: Secondary | ICD-10-CM

## 2021-07-17 DIAGNOSIS — L03115 Cellulitis of right lower limb: Secondary | ICD-10-CM | POA: Diagnosis not present

## 2021-07-20 ENCOUNTER — Ambulatory Visit (INDEPENDENT_AMBULATORY_CARE_PROVIDER_SITE_OTHER): Payer: Medicare PPO | Admitting: Emergency Medicine

## 2021-07-20 ENCOUNTER — Ambulatory Visit: Payer: Medicare PPO

## 2021-07-20 ENCOUNTER — Encounter: Payer: Self-pay | Admitting: Emergency Medicine

## 2021-07-20 DIAGNOSIS — T148XXA Other injury of unspecified body region, initial encounter: Secondary | ICD-10-CM

## 2021-07-20 DIAGNOSIS — Z23 Encounter for immunization: Secondary | ICD-10-CM | POA: Diagnosis not present

## 2021-07-20 NOTE — Patient Instructions (Signed)
Continue and finish antibiotic, doxycycline. Puncture Wound A puncture wound is an injury that is caused by a sharp, thin object that goes through your skin. A puncture wound usually does not leave a large opening in your skin, so it may not bleed a lot. However, when you get a puncture wound, dirt or other materials (foreign bodies) can be forced into your wound and can break off inside. This increases the chance of infection, such as tetanus. There are many sharp, pointed objects that can cause puncture wounds, including teeth, nails, splinters of glass, fishhooks, and needles. Treatment may include the following steps: Washing out the wound with a germ-free (sterile) salt-water solution. Having surgery to open the wound and remove materials from it. Closing the wound with stitches (sutures). Covering the wound with antibiotic ointment and a bandage (dressing). Depending on what caused the injury, you may also need a tetanus shot or a rabies shot. Follow these instructions at home: Medicines Take or apply over-the-counter and prescription medicines only as told by your doctor. If you were prescribed an antibiotic medicine, take or apply it as told by your doctor. Do not stop using the antibiotic even if your condition starts to get better. Bathing Keep the bandage dry as told by your doctor. Do not take baths, swim, or use a hot tub until your doctor approves. Ask your doctor if you may take showers. You may only be allowed to take sponge baths. Wound care  There are many ways to close and cover a wound. For example, a wound can be closed with stitches, skin glue, or skin tape (adhesive strips). Follow instructions from your doctor about how to take care of your wound. Make sure you: Wash your hands with soap and water before and after you change your bandage. If you cannot use soap and water, use hand sanitizer. Change your bandage as told by your doctor. Leave stitches, skin glue, or skin  tape strips in place. They may need to stay in place for 2 weeks or longer. If tape strips get loose and curl up, you may trim the loose edges. Do not remove tape strips completely unless your doctor says it is okay. Clean the wound as told by your doctor. Do not scratch or pick at the wound. Check your wound every day for signs of infection. Watch for: Redness, swelling, or pain. Fluid or blood. Warmth. Pus or a bad smell. General instructions Raise (elevate) the injured area above the level of your heart while you are sitting or lying down. If your puncture wound is in your foot, ask your doctor if you need to avoid putting weight on your foot and for how long. Use crutches as told by your doctor. Keep all follow-up visits as told by your doctor. This is important. Contact a doctor if: You got a tetanus shot and you have any of these problems at the injection site: Swelling. Very bad pain. Redness. Bleeding. You have a fever. Your stitches come out. You notice a bad smell coming from your wound or your bandage. You notice something coming out of the wound, such as wood or glass. Medicine does not help your pain. You have more redness, swelling, or pain at the site of your wound. You have fluid, blood, or pus coming from your wound. You notice a change in the color of your skin near your wound. You need to change the bandage often because fluid, blood, or pus is coming from the wound. You start  to have a new rash. You start to lose feeling (have numbness) around the wound. You have warmth around your wound. Get help right away if: You have very bad swelling around the wound. Your pain quickly gets worse and is very bad. You start to get painful skin lumps. You have a red streak going away from your wound. The wound is on your hand or foot and you: Cannot move a finger or toe like normal. Notice that your fingers or toes look pale or blue. Summary A puncture wound is an injury  that is caused by a sharp, thin object that goes through your skin. Treatment may include washing out the wound, having surgery to open the wound to clean it, closing the wound, and covering the wound with a bandage. Follow instructions from your doctor about how to take care of your wound. Contact your doctor if you have more redness, swelling, or pain at the site of your wound. Keep all follow-up visits as told by your doctor. This is important. This information is not intended to replace advice given to you by your health care provider. Make sure you discuss any questions you have with your health care provider. Document Revised: 11/04/2019 Document Reviewed: 01/31/2018 Elsevier Patient Education  [place].

## 2021-07-20 NOTE — Progress Notes (Signed)
Ruth Walker 76 y.o.   Chief Complaint  Patient presents with   Foot Pain    Pt states she stepped on fishing hook    HISTORY OF PRESENT ILLNESS: This is a 76 y.o. female stepped on fishhook several days ago.  Needs tetanus vaccine. Stepped with her left foot. Also has lesion on her right foot which was looked at by staff at urgent care center.  Patient was placed on doxycycline.  Someone stepped on her foot and crushed superficial blood vessel which bled for a little while. No other complaints or medical concerns today.  Foot Pain Pertinent negatives include no abdominal pain, chest pain, chills, congestion, coughing, fever, headaches, nausea, rash, sore throat or vomiting.    Prior to Admission medications   Medication Sig Start Date End Date Taking? Authorizing Provider  ALPRAZolam (XANAX) 0.25 MG tablet Take 1 tablet (0.25 mg total) by mouth daily as needed for anxiety. 04/04/21  Yes Minto, Ines Bloomer, MD  AMBULATORY NON FORMULARY MEDICATION Medication Name: Nitroglycerin 0.125% gel, apply rectally tid for 6-8 weeks 02/26/19  Yes Thornton Park, MD  aspirin 81 MG tablet Take 81 mg by mouth daily.     Yes [provider]  Calcium Carbonate (CALCIUM 600 PO) Take 1 tablet by mouth 2 (two) times daily.   Yes [provider]  cycloSPORINE (RESTASIS) 0.05 % ophthalmic emulsion 1 drop 2 (two) times daily.     Yes [provider]  estradiol (ESTRACE) 0.1 MG/GM vaginal cream Place 1.02 Applicatorfuls vaginally 2 (two) times a week. 02/24/15  Yes Roselee Culver, MD  fluticasone [facility]) 50 MCG/ACT nasal spray Place 2 sprays into both nostrils as needed. 09/16/18  Yes Courney Garrod, Ines Bloomer, MD  hydrochlorothiazide (HYDRODIURIL) 25 MG tablet TAKE 1 TABLET(25 MG) BY MOUTH DAILY 06/27/21  Yes Rosana Farnell, Ines Bloomer, MD  loratadine (CLARITIN) 10 MG tablet TAKE 1 TABLET(10 MG) BY MOUTH DAILY 04/17/20  Yes Clema Skousen, Ines Bloomer, MD  metoprolol tartrate  (LOPRESSOR) 25 MG tablet TAKE 1 TABLET(25 MG) BY MOUTH TWICE DAILY 07/18/21  Yes Pansey Pinheiro, Ines Bloomer, MD  polyethylene glycol (MIRALAX / GLYCOLAX) packet Take 17 g by mouth daily. Reported on 07/10/2015   Yes [provider]  PRESCRIPTION MEDICATION Allergy injection once a week.[facility]   Yes [provider]  Probiotic Product (ALIGN) 4 MG CAPS Take 1 capsule by mouth daily. 07/05/11  Yes Sable Feil, MD  Wheat Dextrin (BENEFIBER DRINK MIX PO) Take 10 mLs by mouth every morning. Take 2 teaspoons every morning     Yes [provider]  Linaclotide (LINZESS) 145 MCG CAPS Take 1 capsule by mouth daily. 09/05/11 10/03/11  Sable Feil, MD    Allergies  Allergen Reactions   Aloe    Benadryl [Diphenhydramine Hcl] Other (See Comments)    Makes her feel "hyper"   Cephalexin     REACTION: coarse rash   Ciprofloxacin    Mucinex [Guaifenesin Er] Other (See Comments)    Makes her feel worse   Shellfish Allergy Hives   Sudafed [Pseudoephedrine Hcl] Hives and Other (See Comments)    Itching and hives asthma   Sulfonamide Derivatives     Patient Active Problem List   Diagnosis Date Noted   Uterine polyp    Vertebral compression fracture (HCC)    IBS (irritable bowel syndrome) 09/05/2011   Constipation, slow transit 09/05/2011   Anxiety disorder 09/05/2011   Diverticulosis 11/04/2010   Gallstones 11/04/2010   ANAL FISSURE  05/26/2008   OBSESSIVE-COMPULSIVE PERSONALITY DISORDER 02/28/2008   Essential hypertension 02/28/2008   CYSTITIS, CHRONIC 02/28/2008    Past Medical History:  Diagnosis Date   Allergy    Anal fissure    Anxiety states    Asthma    Gets allergy shots once a week    Cholelithiasis    Depression    Diverticulosis    Environmental allergies    Hemorrhoids    Irritable bowel syndrome (IBS)    Osteoporosis    Skin cancer    Leg   Uterine polyp    Vertebral compression fracture (HCC)     Past Surgical  History:  Procedure Laterality Date   BACK SURGERY     CESAREAN SECTION     DILATION AND CURETTAGE OF UTERUS     Endometrial polypectomy     FRACTURE SURGERY     neck (vertebrae)   HEMORRHOID SURGERY     HYSTEROSCOPY     IBS     MOUTH SURGERY     SKIN CANCER EXCISION     Leg     Social History   Socioeconomic History   Marital status: Married    Spouse name: Not on file   Number of children: 2   Years of education: some college   Highest education level: Not on file  Occupational History   Occupation: Retired  Tobacco Use   Smoking status: Never   Smokeless tobacco: Never  Vaping Use   Vaping Use: Never used  Substance and Sexual Activity   Alcohol use: No    Alcohol/week: 0.0 standard drinks   Drug use: No   Sexual activity: Yes    Birth control/protection: Post-menopausal    Comment: number of sex partners in the last 21 months  1  Other Topics Concern   Not on file  Social History Narrative   Patient gets regular exercise. Cares for her grandchildren.   Social Determinants of Health   Financial Resource Strain: Not on file  Food Insecurity: Not on file  Transportation Needs: Not on file  Physical Activity: Not on file  Stress: Not on file  Social Connections: Not on file  Intimate Partner Violence: Not on file    Family History  Problem Relation Age of Onset   Diabetes Mother    Hypertension Mother    Thyroid disease Mother    Hypertension Father    Heart disease Father    Thyroid disease Sister    Colon cancer Neg Hx      Review of Systems  Constitutional: Negative.  Negative for chills and fever.  HENT: Negative.  Negative for congestion and sore throat.   Respiratory: Negative.  Negative for cough and shortness of breath.   Cardiovascular: Negative.  Negative for chest pain and palpitations.  Gastrointestinal:  Negative for abdominal pain, nausea and vomiting.  Skin: Negative.  Negative for rash.  Neurological:  Negative for dizziness and  headaches.  All other systems reviewed and are negative.   Physical Exam Vitals reviewed.  Constitutional:      Appearance: Normal appearance.  HENT:     Head: Normocephalic.  Eyes:     Extraocular Movements: Extraocular movements intact.  Cardiovascular:     Rate and Rhythm: Normal rate.  Pulmonary:     Effort: Pulmonary effort is normal.  Musculoskeletal:     Comments: Left foot: No visible puncture wound.  No signs of infection.  Normal findings Right foot: Many superficial small varicose veins.  No  open wounds.  No signs of infection.  Skin:    General: Skin is warm and dry.     Capillary Refill: Capillary refill takes less than 2 seconds.  Neurological:     General: No focal deficit present.     Mental Status: She is alert and oriented to person, place, and time.  Psychiatric:        Mood and Affect: Mood normal.        Behavior: Behavior normal.     ASSESSMENT & PLAN: Problem List Items Addressed This Visit   None Visit Diagnoses     Puncture wound    -  Primary   Left foot   Relevant Orders   Tdap vaccine greater than or equal to 7yo IM (Completed)      Patient Instructions  Continue and finish antibiotic, doxycycline. Puncture Wound A puncture wound is an injury that is caused by a sharp, thin object that goes through your skin. A puncture wound usually does not leave a large opening in your skin, so it may not bleed a lot. However, when you get a puncture wound, dirt or other materials (foreign bodies) can be forced into your wound and can break off inside. This increases the chance of infection, such as tetanus. There are many sharp, pointed objects that can cause puncture wounds, including teeth, nails, splinters of glass, fishhooks, and needles. Treatment may include the following steps: Washing out the wound with a germ-free (sterile) salt-water solution. Having surgery to open the wound and remove materials from it. Closing the wound with stitches  (sutures). Covering the wound with antibiotic ointment and a bandage (dressing). Depending on what caused the injury, you may also need a tetanus shot or a rabies shot. Follow these instructions at home: Medicines Take or apply over-the-counter and prescription medicines only as told by your doctor. If you were prescribed an antibiotic medicine, take or apply it as told by your doctor. Do not stop using the antibiotic even if your condition starts to get better. Bathing Keep the bandage dry as told by your doctor. Do not take baths, swim, or use a hot tub until your doctor approves. Ask your doctor if you may take showers. You may only be allowed to take sponge baths. Wound care  There are many ways to close and cover a wound. For example, a wound can be closed with stitches, skin glue, or skin tape (adhesive strips). Follow instructions from your doctor about how to take care of your wound. Make sure you: Wash your hands with soap and water before and after you change your bandage. If you cannot use soap and water, use hand sanitizer. Change your bandage as told by your doctor. Leave stitches, skin glue, or skin tape strips in place. They may need to stay in place for 2 weeks or longer. If tape strips get loose and curl up, you may trim the loose edges. Do not remove tape strips completely unless your doctor says it is okay. Clean the wound as told by your doctor. Do not scratch or pick at the wound. Check your wound every day for signs of infection. Watch for: Redness, swelling, or pain. Fluid or blood. Warmth. Pus or a bad smell. General instructions Raise (elevate) the injured area above the level of your heart while you are sitting or lying down. If your puncture wound is in your foot, ask your doctor if you need to avoid putting weight on your foot and for how  long. Use crutches as told by your doctor. Keep all follow-up visits as told by your doctor. This is important. Contact a  doctor if: You got a tetanus shot and you have any of these problems at the injection site: Swelling. Very bad pain. Redness. Bleeding. You have a fever. Your stitches come out. You notice a bad smell coming from your wound or your bandage. You notice something coming out of the wound, such as wood or glass. Medicine does not help your pain. You have more redness, swelling, or pain at the site of your wound. You have fluid, blood, or pus coming from your wound. You notice a change in the color of your skin near your wound. You need to change the bandage often because fluid, blood, or pus is coming from the wound. You start to have a new rash. You start to lose feeling (have numbness) around the wound. You have warmth around your wound. Get help right away if: You have very bad swelling around the wound. Your pain quickly gets worse and is very bad. You start to get painful skin lumps. You have a red streak going away from your wound. The wound is on your hand or foot and you: Cannot move a finger or toe like normal. Notice that your fingers or toes look pale or blue. Summary A puncture wound is an injury that is caused by a sharp, thin object that goes through your skin. Treatment may include washing out the wound, having surgery to open the wound to clean it, closing the wound, and covering the wound with a bandage. Follow instructions from your doctor about how to take care of your wound. Contact your doctor if you have more redness, swelling, or pain at the site of your wound. Keep all follow-up visits as told by your doctor. This is important. This information is not intended to replace advice given to you by your health care provider. Make sure you discuss any questions you have with your health care provider. Document Revised: 11/04/2019 Document Reviewed: 01/31/2018 Elsevier Patient Education  2022 [place], MD [facility]

## 2021-07-28 ENCOUNTER — Ambulatory Visit: Payer: Medicare PPO | Admitting: Emergency Medicine

## 2021-07-28 DIAGNOSIS — J3089 Other allergic rhinitis: Secondary | ICD-10-CM | POA: Diagnosis not present

## 2021-07-28 DIAGNOSIS — J301 Allergic rhinitis due to pollen: Secondary | ICD-10-CM | POA: Diagnosis not present

## 2021-07-28 DIAGNOSIS — J3081 Allergic rhinitis due to animal (cat) (dog) hair and dander: Secondary | ICD-10-CM | POA: Diagnosis not present

## 2021-08-05 ENCOUNTER — Other Ambulatory Visit: Payer: Self-pay | Admitting: Emergency Medicine

## 2021-08-05 DIAGNOSIS — F411 Generalized anxiety disorder: Secondary | ICD-10-CM

## 2021-08-05 NOTE — Telephone Encounter (Signed)
Patient is requesting a refill of the following medications: Requested Prescriptions   Pending Prescriptions Disp Refills   ALPRAZolam (XANAX) 0.25 MG tablet [Pharmacy Med Name: ALPRAZOLAM 0.25MG  TABLETS] 30 tablet 0    Sig: TAKE 1 TABLET(0.25 MG) BY MOUTH DAILY AS NEEDED FOR ANXIETY    Date of patient request: 08/05/21  Last office visit: 07/20/21  Date of last refill: 04/04/21 Last refill amount: 30 Follow up time period per chart: n/a

## 2021-08-17 DIAGNOSIS — J301 Allergic rhinitis due to pollen: Secondary | ICD-10-CM | POA: Diagnosis not present

## 2021-08-17 DIAGNOSIS — J3081 Allergic rhinitis due to animal (cat) (dog) hair and dander: Secondary | ICD-10-CM | POA: Diagnosis not present

## 2021-08-17 DIAGNOSIS — J3089 Other allergic rhinitis: Secondary | ICD-10-CM | POA: Diagnosis not present

## 2021-08-24 DIAGNOSIS — Z20822 Contact with and (suspected) exposure to covid-19: Secondary | ICD-10-CM | POA: Diagnosis not present

## 2021-08-24 DIAGNOSIS — J029 Acute pharyngitis, unspecified: Secondary | ICD-10-CM | POA: Diagnosis not present

## 2021-08-24 DIAGNOSIS — U071 COVID-19: Secondary | ICD-10-CM | POA: Diagnosis not present

## 2021-08-24 DIAGNOSIS — R059 Cough, unspecified: Secondary | ICD-10-CM | POA: Diagnosis not present

## 2021-08-25 ENCOUNTER — Encounter (HOSPITAL_COMMUNITY): Payer: Self-pay | Admitting: *Deleted

## 2021-08-25 ENCOUNTER — Emergency Department (HOSPITAL_COMMUNITY)
Admission: EM | Admit: 2021-08-25 | Discharge: 2021-08-25 | Disposition: A | Discharge status: Home / Self Care | Payer: Medicare PPO | Attending: Emergency Medicine | Admitting: Emergency Medicine

## 2021-08-25 ENCOUNTER — Emergency Department (HOSPITAL_COMMUNITY): Payer: Medicare PPO

## 2021-08-25 ENCOUNTER — Other Ambulatory Visit: Payer: Self-pay

## 2021-08-25 DIAGNOSIS — M545 Low back pain, unspecified: Secondary | ICD-10-CM | POA: Diagnosis not present

## 2021-08-25 DIAGNOSIS — M1612 Unilateral primary osteoarthritis, left hip: Secondary | ICD-10-CM | POA: Diagnosis not present

## 2021-08-25 DIAGNOSIS — S3992XA Unspecified injury of lower back, initial encounter: Secondary | ICD-10-CM | POA: Diagnosis present

## 2021-08-25 DIAGNOSIS — Z7982 Long term (current) use of aspirin: Secondary | ICD-10-CM | POA: Diagnosis not present

## 2021-08-25 DIAGNOSIS — Z79899 Other long term (current) drug therapy: Secondary | ICD-10-CM | POA: Diagnosis not present

## 2021-08-25 DIAGNOSIS — I10 Essential (primary) hypertension: Secondary | ICD-10-CM | POA: Diagnosis not present

## 2021-08-25 DIAGNOSIS — S300XXA Contusion of lower back and pelvis, initial encounter: Secondary | ICD-10-CM | POA: Diagnosis not present

## 2021-08-25 DIAGNOSIS — W01198A Fall on same level from slipping, tripping and stumbling with subsequent striking against other object, initial encounter: Secondary | ICD-10-CM | POA: Diagnosis not present

## 2021-08-25 DIAGNOSIS — Z043 Encounter for examination and observation following other accident: Secondary | ICD-10-CM | POA: Diagnosis not present

## 2021-08-25 DIAGNOSIS — J45909 Unspecified asthma, uncomplicated: Secondary | ICD-10-CM | POA: Insufficient documentation

## 2021-08-25 DIAGNOSIS — W010XXA Fall on same level from slipping, tripping and stumbling without subsequent striking against object, initial encounter: Secondary | ICD-10-CM

## 2021-08-25 NOTE — Discharge Instructions (Signed)
Apply ice for 30 minutes at a time, 4 times a day.  Take acetaminophen or ibuprofen as needed for pain.

## 2021-08-25 NOTE — ED Triage Notes (Signed)
Pt states tripped on bedside table legs onto floor, c/o left buttocks and lower back pain. Did not hit head.

## 2021-08-25 NOTE — ED Provider Notes (Signed)
[facility] DEPT Provider Note   CSN: [id number] Arrival date & time: 08/25/21  0222     History  Chief Complaint  Patient presents with   Ruth Walker is a 76 y.o. female.  The history is provided by the patient.  Fall She has history of hypertension, asthma, anxiety and comes in following a fall.  She was in the emergency department with her husband when she got up and tripped and fell.  She is complaining of pain in her upper lumbar area and in the left sacroiliac area.   Home Medications Prior to Admission medications   Medication Sig Start Date End Date Taking? Authorizing Provider  ALPRAZolam Duanne Moron) 0.25 MG tablet TAKE 1 TABLET(0.25 MG) BY MOUTH DAILY AS NEEDED FOR ANXIETY 08/07/21   Sagardia, Ines Bloomer, MD  AMBULATORY NON FORMULARY MEDICATION Medication Name: Nitroglycerin 0.125% gel, apply rectally tid for 6-8 weeks 02/26/19   Thornton Park, MD  aspirin 81 MG tablet Take 81 mg by mouth daily.      [provider]  Calcium Carbonate (CALCIUM 600 PO) Take 1 tablet by mouth 2 (two) times daily.    [provider]  cycloSPORINE (RESTASIS) 0.05 % ophthalmic emulsion 1 drop 2 (two) times daily.      [provider]  estradiol (ESTRACE) 0.1 MG/GM vaginal cream Place 1.77 Applicatorfuls vaginally 2 (two) times a week. 02/24/15   Roselee Culver, MD  fluticasone [facility]) 50 MCG/ACT nasal spray Place 2 sprays into both nostrils as needed. 09/16/18   Horald Pollen, MD  hydrochlorothiazide (HYDRODIURIL) 25 MG tablet TAKE 1 TABLET(25 MG) BY MOUTH DAILY 06/27/21   Horald Pollen, MD  loratadine (CLARITIN) 10 MG tablet TAKE 1 TABLET(10 MG) BY MOUTH DAILY 04/17/20   Horald Pollen, MD  metoprolol tartrate (LOPRESSOR) 25 MG tablet TAKE 1 TABLET(25 MG) BY MOUTH TWICE DAILY 07/18/21   Horald Pollen, MD  polyethylene glycol [facility] / GLYCOLAX) packet Take 17 g by mouth daily. Reported  on 07/10/2015    [provider]  PRESCRIPTION MEDICATION Allergy injection once a week.[facility]    [provider]  Probiotic Product (ALIGN) 4 MG CAPS Take 1 capsule by mouth daily. 07/05/11   Sable Feil, MD  Wheat Dextrin (BENEFIBER DRINK MIX PO) Take 10 mLs by mouth every morning. Take 2 teaspoons every morning      [provider]  Linaclotide (LINZESS) 145 MCG CAPS Take 1 capsule by mouth daily. 09/05/11 10/03/11  Sable Feil, MD      Allergies    Aloe, Benadryl [diphenhydramine hcl], Cephalexin, Ciprofloxacin, Mucinex [guaifenesin er], Shellfish allergy, Sudafed [pseudoephedrine hcl], and Sulfonamide derivatives    Review of Systems   Review of Systems  All other systems reviewed and are negative.  Physical Exam Updated Vital Signs BP (!) 173/81 (BP Location: Right Arm)    Pulse 81    Temp 99.1 F (37.3 C) (Oral)    Resp 18    SpO2 98%  Physical Exam Vitals and nursing note reviewed.  76 year old female, resting comfortably and in no acute distress. Vital signs are significant for elevated blood pressure. Oxygen saturation is 98%, which is normal. Head is normocephalic and atraumatic. PERRLA, EOMI. Oropharynx is clear. Neck is nontender and supple without adenopathy or JVD. Back is mildly tender in the mid and upper lumbar spine, no other areas of tenderness elicited. Lungs are clear without rales, wheezes,  or rhonchi. Chest is nontender. Heart has regular rate and rhythm without murmur. Abdomen is soft, flat, nontender without masses or hepatosplenomegaly and peristalsis is normoactive. Extremities: There is mild tenderness in the left sacroiliac area, full range of motion present of the left hip without obvious pain.  She is able to ambulate without difficulty.  Full range of motion of all other joints without pain. Skin is warm and dry without rash. Neurologic: Mental status is normal, cranial nerves are intact,  moves all extremities equally, gait is normal.  ED Results / Procedures / Treatments    Radiology DG Lumbar Spine Complete  Result Date: 08/25/2021 CLINICAL DATA:  Fall.  Low back and left buttock pain. EXAM: LUMBAR SPINE - COMPLETE 4+ VIEW; DG HIP (WITH OR WITHOUT PELVIS) 2-3V LEFT COMPARISON:  10/03/2017. FINDINGS: There is no evidence of lumbar spine fracture. Mild dextrocurvature of the lumbar spine is noted. Alignment is normal. Multilevel intervertebral disc space narrowing, degenerative endplate changes, and facet arthropathy are noted. There is atherosclerotic calcification of the aorta. No acute fracture or dislocation at the hips bilaterally. Mild degenerative changes are noted at the hips bilaterally. IMPRESSION: 1. Multilevel degenerative changes in the lumbar spine without evidence of acute fracture. 2. Degenerative changes in the hips bilaterally. No acute fracture or dislocation. Electronically Signed   By: Brett Fairy M.D.   On: 08/25/2021 03:23   DG Hip Unilat W or Wo Pelvis 2-3 Views Left  Result Date: 08/25/2021 CLINICAL DATA:  Fall.  Low back and left buttock pain. EXAM: LUMBAR SPINE - COMPLETE 4+ VIEW; DG HIP (WITH OR WITHOUT PELVIS) 2-3V LEFT COMPARISON:  10/03/2017. FINDINGS: There is no evidence of lumbar spine fracture. Mild dextrocurvature of the lumbar spine is noted. Alignment is normal. Multilevel intervertebral disc space narrowing, degenerative endplate changes, and facet arthropathy are noted. There is atherosclerotic calcification of the aorta. No acute fracture or dislocation at the hips bilaterally. Mild degenerative changes are noted at the hips bilaterally. IMPRESSION: 1. Multilevel degenerative changes in the lumbar spine without evidence of acute fracture. 2. Degenerative changes in the hips bilaterally. No acute fracture or dislocation. Electronically Signed   By: Brett Fairy M.D.   On: 08/25/2021 03:23    Procedures Procedures    Medications Ordered in  ED Medications - No data to display  ED Course/ Medical Decision Making/ A&P                           Medical Decision Making Amount and/or Complexity of Data Reviewed Radiology: ordered.   Trip and fall with pain in the lumbar spine and right sacroiliac area.  Doubt fracture, but will send for x-ray to be sure.  X-rays show no fracture.  I have independently viewed the images, and agree with the radiologist's interpretation.  Patient is advised of this finding and told to apply ice, use over-the-counter analgesics as needed for pain.        Final Clinical Impression(s) / ED Diagnoses Final diagnoses:  Fall from slip, trip, or stumble, initial encounter  Contusion of sacrum, initial encounter  Contusion of lower back, initial encounter  Elevated blood pressure reading with diagnosis of hypertension    Rx / DC Orders ED Discharge Orders     None         Delora Fuel, MD 03/83/33 0725

## 2021-08-30 DIAGNOSIS — U071 COVID-19: Secondary | ICD-10-CM | POA: Diagnosis not present

## 2021-09-13 ENCOUNTER — Other Ambulatory Visit: Payer: Self-pay | Admitting: Emergency Medicine

## 2021-09-13 DIAGNOSIS — F411 Generalized anxiety disorder: Secondary | ICD-10-CM

## 2021-09-22 DIAGNOSIS — J301 Allergic rhinitis due to pollen: Secondary | ICD-10-CM | POA: Diagnosis not present

## 2021-09-22 DIAGNOSIS — J3081 Allergic rhinitis due to animal (cat) (dog) hair and dander: Secondary | ICD-10-CM | POA: Diagnosis not present

## 2021-09-22 DIAGNOSIS — J3089 Other allergic rhinitis: Secondary | ICD-10-CM | POA: Diagnosis not present

## 2021-10-03 DIAGNOSIS — L918 Other hypertrophic disorders of the skin: Secondary | ICD-10-CM | POA: Diagnosis not present

## 2021-10-03 DIAGNOSIS — R21 Rash and other nonspecific skin eruption: Secondary | ICD-10-CM | POA: Diagnosis not present

## 2021-10-07 DIAGNOSIS — J3089 Other allergic rhinitis: Secondary | ICD-10-CM | POA: Diagnosis not present

## 2021-10-07 DIAGNOSIS — J301 Allergic rhinitis due to pollen: Secondary | ICD-10-CM | POA: Diagnosis not present

## 2021-10-07 DIAGNOSIS — J3081 Allergic rhinitis due to animal (cat) (dog) hair and dander: Secondary | ICD-10-CM | POA: Diagnosis not present

## 2021-10-13 ENCOUNTER — Other Ambulatory Visit: Payer: Self-pay | Admitting: Emergency Medicine

## 2021-10-13 DIAGNOSIS — I10 Essential (primary) hypertension: Secondary | ICD-10-CM

## 2021-10-21 DIAGNOSIS — J3089 Other allergic rhinitis: Secondary | ICD-10-CM | POA: Diagnosis not present

## 2021-10-21 DIAGNOSIS — J3081 Allergic rhinitis due to animal (cat) (dog) hair and dander: Secondary | ICD-10-CM | POA: Diagnosis not present

## 2021-10-21 DIAGNOSIS — J301 Allergic rhinitis due to pollen: Secondary | ICD-10-CM | POA: Diagnosis not present

## 2021-11-01 DIAGNOSIS — Z20822 Contact with and (suspected) exposure to covid-19: Secondary | ICD-10-CM | POA: Diagnosis not present

## 2021-11-08 DIAGNOSIS — J3089 Other allergic rhinitis: Secondary | ICD-10-CM | POA: Diagnosis not present

## 2021-11-08 DIAGNOSIS — J301 Allergic rhinitis due to pollen: Secondary | ICD-10-CM | POA: Diagnosis not present

## 2021-11-08 DIAGNOSIS — J3081 Allergic rhinitis due to animal (cat) (dog) hair and dander: Secondary | ICD-10-CM | POA: Diagnosis not present

## 2021-11-15 DIAGNOSIS — J3081 Allergic rhinitis due to animal (cat) (dog) hair and dander: Secondary | ICD-10-CM | POA: Diagnosis not present

## 2021-11-15 DIAGNOSIS — J3089 Other allergic rhinitis: Secondary | ICD-10-CM | POA: Diagnosis not present

## 2021-11-15 DIAGNOSIS — J301 Allergic rhinitis due to pollen: Secondary | ICD-10-CM | POA: Diagnosis not present

## 2021-11-17 DIAGNOSIS — Z03818 Encounter for observation for suspected exposure to other biological agents ruled out: Secondary | ICD-10-CM | POA: Diagnosis not present

## 2021-11-17 DIAGNOSIS — Z20822 Contact with and (suspected) exposure to covid-19: Secondary | ICD-10-CM | POA: Diagnosis not present

## 2021-11-21 ENCOUNTER — Ambulatory Visit (INDEPENDENT_AMBULATORY_CARE_PROVIDER_SITE_OTHER): Payer: Medicare PPO

## 2021-11-21 DIAGNOSIS — Z0000 Encounter for general adult medical examination without abnormal findings: Secondary | ICD-10-CM

## 2021-11-21 NOTE — Progress Notes (Signed)
? ?Subjective:  ? Ruth Walker is a 76 y.o. female who presents for Medicare Annual (Subsequent) preventive examination. ? ? ?I connected with Azzie Roup today by telephone and verified that I am speaking with the correct person using two identifiers. ?Location patient: home ?Location provider: work ?Persons participating in the virtual visit: patient, provider. ?  ?I discussed the limitations, risks, security and privacy concerns of performing an evaluation and management service by telephone and the availability of in person appointments. I also discussed with the patient that there may be a patient responsible charge related to this service. The patient expressed understanding and verbally consented to this telephonic visit.  ?  ?Interactive audio and video telecommunications were attempted between this provider and patient, however failed, due to patient having technical difficulties OR patient did not have access to video capability.  We continued and completed visit with audio only. ? ?  ?Review of Systems    ? ?Cardiac Risk Factors include: advanced age (>55mn, >>45women);hypertension ? ?   ?Objective:  ?  ?Today's Vitals  ? ?There is no height or weight on file to calculate BMI. ? ? ?  11/21/2021  ?  1:13 PM 10/09/2017  ?  1:51 PM 06/04/2017  ? 11:34 AM 06/22/2016  ?  1:35 PM  ?Advanced Directives  ?Does Patient Have a Medical Advance Directive? Yes No No Yes  ?Type of [place]aramed[place]of [place] will   [place] will  ?Does patient want to make changes to medical advance directive?    Yes (MAU/Ambulatory/Procedural Areas - Information given)  ?Copy of [place] Chart? No - copy requested   No - copy requested  ?Would patient like information on creating a medical advance directive?  Yes (MAU/Ambulatory/Procedural Areas - Information given) No - Patient declined   ? ? ?Current Medications (verified) ?Outpatient Encounter  Medications as of 11/21/2021  ?Medication Sig  ? ALPRAZolam (XANAX) 0.25 MG tablet TAKE 1 TABLET(0.25 MG) BY MOUTH DAILY AS NEEDED FOR ANXIETY  ? AMBULATORY NON FORMULARY MEDICATION Medication Name: Nitroglycerin 0.125% gel, apply rectally tid for 6-8 weeks  ? aspirin 81 MG tablet Take 81 mg by mouth daily.    ? Calcium Carbonate (CALCIUM 600 PO) Take 1 tablet by mouth 2 (two) times daily.  ? fluticasone (FLONASE) 50 MCG/ACT nasal spray Place 2 sprays into both nostrils as needed.  ? hydrochlorothiazide (HYDRODIURIL) 25 MG tablet TAKE 1 TABLET(25 MG) BY MOUTH DAILY  ? loratadine (CLARITIN) 10 MG tablet TAKE 1 TABLET(10 MG) BY MOUTH DAILY  ? metoprolol tartrate (LOPRESSOR) 25 MG tablet TAKE 1 TABLET(25 MG) BY MOUTH TWICE DAILY  ? polyethylene glycol (MIRALAX / GLYCOLAX) packet Take 17 g by mouth daily. Reported on 07/10/2015  ? PRESCRIPTION MEDICATION Allergy injection once a week[facility] ? Probiotic Product (ALIGN) 4 MG CAPS Take 1 capsule by mouth daily.  ? cycloSPORINE (RESTASIS) 0.05 % ophthalmic emulsion 1 drop 2 (two) times daily.   (Patient not taking: Reported on 11/21/2021)  ? estradiol (ESTRACE) 0.1 MG/GM vaginal cream Place 01.06Applicatorfuls vaginally 2 (two) times a week. (Patient not taking: Reported on 11/21/2021)  ? Wheat Dextrin (BENEFIBER DRINK MIX PO) Take 10 mLs by mouth every morning. Take 2 teaspoons every morning ?  (Patient not taking: Reported on 11/21/2021)  ? [DISCONTINUED] Linaclotide (LINZESS) 145 MCG CAPS Take 1 capsule by mouth daily.  ? ?No facility-administered encounter medications on file as of 11/21/2021.  ? ? ?  Allergies (verified) ?Benadryl [diphenhydramine hcl], Cephalexin, Ciprofloxacin, Mucinex [guaifenesin er], Shellfish allergy, Sudafed [pseudoephedrine hcl], Sulfonamide derivatives, and Aloe  ? ?History: ?Past Medical History:  ?Diagnosis Date  ? Allergy   ? Anal fissure   ? Anxiety states   ? Asthma   ? Gets allergy shots once a week   ? Cholelithiasis    ? Depression   ? Diverticulosis   ? Environmental allergies   ? Hemorrhoids   ? Irritable bowel syndrome (IBS)   ? Osteoporosis   ? Skin cancer   ? Leg  ? Uterine polyp   ? Vertebral compression fracture (HCC)   ? ?Past Surgical History:  ?Procedure Laterality Date  ? BACK SURGERY    ? CESAREAN SECTION    ? DILATION AND CURETTAGE OF UTERUS    ? Endometrial polypectomy    ? FRACTURE SURGERY    ? neck (vertebrae)  ? HEMORRHOID SURGERY    ? HYSTEROSCOPY    ? IBS    ? MOUTH SURGERY    ? SKIN CANCER EXCISION    ? Leg   ? ?Family History  ?Problem Relation Age of Onset  ? Diabetes Mother   ? Hypertension Mother   ? Thyroid disease Mother   ? Hypertension Father   ? Heart disease Father   ? Thyroid disease Sister   ? Colon cancer Neg Hx   ? ?Social History  ? ?Socioeconomic History  ? Marital status: Married  ?  Spouse name: Not on file  ? Number of children: 2  ? Years of education: some college  ? Highest education level: Not on file  ?Occupational History  ? Occupation: Retired  ?Tobacco Use  ? Smoking status: Never  ? Smokeless tobacco: Never  ?Vaping Use  ? Vaping Use: Never used  ?Substance and Sexual Activity  ? Alcohol use: No  ?  Alcohol/week: 0.0 standard drinks  ? Drug use: No  ? Sexual activity: Yes  ?  Birth control/protection: Post-menopausal  ?  Comment: number of sex partners in the last 34 months  1  ?Other Topics Concern  ? Not on file  ?Social History Narrative  ? Patient gets regular exercise. Cares for her grandchildren.  ? ?Social Determinants of Health  ? ?Financial Resource Strain: Low Risk   ? Difficulty of Paying Living Expenses: Not hard at all  ?Food Insecurity: No Food Insecurity  ? Worried About Charity fundraiser in the Last Year: Never true  ? Ran Out of Food in the Last Year: Never true  ?Transportation Needs: No Transportation Needs  ? Lack of Transportation (Medical): No  ? Lack of Transportation (Non-Medical): No  ?Physical Activity: Insufficiently Active  ? Days of Exercise per Week:  3 days  ? Minutes of Exercise per Session: 30 min  ?Stress: No Stress Concern Present  ? Feeling of Stress : Only a little  ?Social Connections: Moderately Integrated  ? Frequency of Communication with Friends and Family: Three times a week  ? Frequency of Social Gatherings with Friends and Family: Three times a week  ? Attends Religious Services: More than 4 times per year  ? Active Member of Clubs or Organizations: No  ? Attends Archivist Meetings: Never  ? Marital Status: Married  ? ? ?Tobacco Counseling ?Counseling given: Not Answered ? ? ?Clinical Intake: ? ?Pre-visit preparation completed: Yes ? ?Pain : No/denies pain ? ?  ? ?Nutritional Risks: None ?Diabetes: No ? ?How often do you need to have  someone help you when you read instructions, pamphlets, or other written materials from your doctor or pharmacy?: 1 - Never ?What is the last grade level you completed in school?: college ? ?Diabetic?no  ? ?Interpreter Needed?: No ? ?Information entered by :: [email or web address] ? ? ?Activities of Daily Living ? ?  11/21/2021  ?  1:14 PM  ?In your present state of health, do you have any difficulty performing the following activities:  ?Hearing? 0  ?Vision? 0  ?Difficulty concentrating or making decisions? 0  ?Walking or climbing stairs? 0  ?Dressing or bathing? 0  ?Doing errands, shopping? 0  ?Preparing Food and eating ? N  ?Using the Toilet? N  ?In the past six months, have you accidently leaked urine? N  ?Do you have problems with loss of bowel control? N  ?Managing your Medications? N  ?Managing your Finances? N  ?Housekeeping or managing your Housekeeping? N  ? ? ?Patient Care Team: ?Horald Pollen, MD as PCP - General (Internal Medicine) ? ?Indicate any recent Medical Services you may have received from other than [facility] providers in the past year (date may be approximate). ? ?   ?Assessment:  ? This is a routine wellness examination for Avigail. ? ?Hearing/Vision screen ?Vision Screening - Comments::  Declined due having Covid ? ?Dietary issues and exercise activities discussed: ?Current Exercise Habits: Home exercise routine, Type of exercise: walking, Time (Minutes): 30, Frequency (Times/Week): 3, Week

## 2021-11-21 NOTE — Patient Instructions (Signed)
Ruth Walker , ?Thank you for taking time to come for your Medicare Wellness Visit. I appreciate your ongoing commitment to your health goals. Please review the following plan we discussed and let me know if I can assist you in the future.  ? ?Screening recommendations/referrals: ?Colonoscopy:  patient to call schedule  ?Mammogram:  patient to cal schedule  ?Bone Density: patient to call schedule  ?Recommended yearly ophthalmology/optometry visit for glaucoma screening and checkup ?Recommended yearly dental visit for hygiene and checkup ? ?Vaccinations: ?Influenza vaccine: completed  ?Pneumococcal vaccine: completed  ?Tdap vaccine: 07/20/2021 ?Shingles vaccine: will consider    ? ?Advanced directives: yes  ? ?Conditions/risks identified: none  ? ?Next appointment: none  ? ? ?Preventive Care 75 Years and Older, Female ?Preventive care refers to lifestyle choices and visits with your health care provider that can promote health and wellness. ?What does preventive care include? ?A yearly physical exam. This is also called an annual well check. ?Dental exams once or twice a year. ?Routine eye exams. Ask your health care provider how often you should have your eyes checked. ?Personal lifestyle choices, including: ?Daily care of your teeth and gums. ?Regular physical activity. ?Eating a healthy diet. ?Avoiding tobacco and drug use. ?Limiting alcohol use. ?Practicing safe sex. ?Taking low-dose aspirin every day. ?Taking vitamin and mineral supplements as recommended by your health care provider. ?What happens during an annual well check? ?The services and screenings done by your health care provider during your annual well check will depend on your age, overall health, lifestyle risk factors, and family history of disease. ?Counseling  ?Your health care provider may ask you questions about your: ?Alcohol use. ?Tobacco use. ?Drug use. ?Emotional well-being. ?Home and relationship well-being. ?Sexual activity. ?Eating  habits. ?History of falls. ?Memory and ability to understand (cognition). ?Work and work Statistician. ?Reproductive health. ?Screening  ?You may have the following tests or measurements: ?Height, weight, and BMI. ?Blood pressure. ?Lipid and cholesterol levels. These may be checked every 5 years, or more frequently if you are over 80 years old. ?Skin check. ?Lung cancer screening. You may have this screening every year starting at age 65 if you have a 30-pack-year history of smoking and currently smoke or have quit within the past 15 years. ?Fecal occult blood test (FOBT) of the stool. You may have this test every year starting at age 34. ?Flexible sigmoidoscopy or colonoscopy. You may have a sigmoidoscopy every 5 years or a colonoscopy every 10 years starting at age 12. ?Hepatitis C blood test. ?Hepatitis B blood test. ?Sexually transmitted disease (STD) testing. ?Diabetes screening. This is done by checking your blood sugar (glucose) after you have not eaten for a while (fasting). You may have this done every 1-3 years. ?Bone density scan. This is done to screen for osteoporosis. You may have this done starting at age 47. ?Mammogram. This may be done every 1-2 years. Talk to your health care provider about how often you should have regular mammograms. ?Talk with your health care provider about your test results, treatment options, and if necessary, the need for more tests. ?Vaccines  ?Your health care provider may recommend certain vaccines, such as: ?Influenza vaccine. This is recommended every year. ?Tetanus, diphtheria, and acellular pertussis (Tdap, Td) vaccine. You may need a Td booster every 10 years. ?Zoster vaccine. You may need this after age 27. ?Pneumococcal 13-valent conjugate (PCV13) vaccine. One dose is recommended after age 65. ?Pneumococcal polysaccharide (PPSV23) vaccine. One dose is recommended after age 26. ?Talk to  your health care provider about which screenings and vaccines you need and how  often you need them. ?This information is not intended to replace advice given to you by your health care provider. Make sure you discuss any questions you have with your health care provider. ?Document Released: 07/23/2015 Document Revised: 03/15/2016 Document Reviewed: 04/27/2015 ?Elsevier Interactive Patient Education ? 2017 [employer]. ? ?Fall Prevention in the Home ?Falls can cause injuries. They can happen to people of all ages. There are many things you can do to make your home safe and to help prevent falls. ?What can I do on the outside of my home? ?Regularly fix the edges of walkways and driveways and fix any cracks. ?Remove anything that might make you trip as you walk through a door, such as a raised step or threshold. ?Trim any bushes or trees on the path to your home. ?Use bright outdoor lighting. ?Clear any walking paths of anything that might make someone trip, such as rocks or tools. ?Regularly check to see if handrails are loose or broken. Make sure that both sides of any steps have handrails. ?Any raised decks and porches should have guardrails on the edges. ?Have any leaves, snow, or ice cleared regularly. ?Use sand or salt on walking paths during winter. ?Clean up any spills in your garage right away. This includes oil or grease spills. ?What can I do in the bathroom? ?Use night lights. ?Install grab bars by the toilet and in the tub and shower. Do not use towel bars as grab bars. ?Use non-skid mats or decals in the tub or shower. ?If you need to sit down in the shower, use a plastic, non-slip stool. ?Keep the floor dry. Clean up any water that spills on the floor as soon as it happens. ?Remove soap buildup in the tub or shower regularly. ?Attach bath mats securely with double-sided non-slip rug tape. ?Do not have throw rugs and other things on the floor that can make you trip. ?What can I do in the bedroom? ?Use night lights. ?Make sure that you have a light by your bed that is easy to  reach. ?Do not use any sheets or blankets that are too big for your bed. They should not hang down onto the floor. ?Have a firm chair that has side arms. You can use this for support while you get dressed. ?Do not have throw rugs and other things on the floor that can make you trip. ?What can I do in the kitchen? ?Clean up any spills right away. ?Avoid walking on wet floors. ?Keep items that you use a lot in easy-to-reach places. ?If you need to reach something above you, use a strong step stool that has a grab bar. ?Keep electrical cords out of the way. ?Do not use floor polish or wax that makes floors slippery. If you must use wax, use non-skid floor wax. ?Do not have throw rugs and other things on the floor that can make you trip. ?What can I do with my stairs? ?Do not leave any items on the stairs. ?Make sure that there are handrails on both sides of the stairs and use them. Fix handrails that are broken or loose. Make sure that handrails are as long as the stairways. ?Check any carpeting to make sure that it is firmly attached to the stairs. Fix any carpet that is loose or worn. ?Avoid having throw rugs at the top or bottom of the stairs. If you do have throw rugs, attach  them to the floor with carpet tape. ?Make sure that you have a light switch at the top of the stairs and the bottom of the stairs. If you do not have them, ask someone to add them for you. ?What else can I do to help prevent falls? ?Wear shoes that: ?Do not have high heels. ?Have rubber bottoms. ?Are comfortable and fit you well. ?Are closed at the toe. Do not wear sandals. ?If you use a stepladder: ?Make sure that it is fully opened. Do not climb a closed stepladder. ?Make sure that both sides of the stepladder are locked into place. ?Ask someone to hold it for you, if possible. ?Clearly mark and make sure that you can see: ?Any grab bars or handrails. ?First and last steps. ?Where the edge of each step is. ?Use tools that help you move  around (mobility aids) if they are needed. These include: ?Canes. ?Walkers. ?Scooters. ?Crutches. ?Turn on the lights when you go into a dark area. Replace any light bulbs as soon as they burn out. ?Set up your furniture

## 2021-11-23 ENCOUNTER — Other Ambulatory Visit: Payer: Self-pay | Admitting: Emergency Medicine

## 2021-11-23 DIAGNOSIS — I10 Essential (primary) hypertension: Secondary | ICD-10-CM

## 2021-11-24 DIAGNOSIS — J3081 Allergic rhinitis due to animal (cat) (dog) hair and dander: Secondary | ICD-10-CM | POA: Diagnosis not present

## 2021-11-24 DIAGNOSIS — J3089 Other allergic rhinitis: Secondary | ICD-10-CM | POA: Diagnosis not present

## 2021-11-24 DIAGNOSIS — J301 Allergic rhinitis due to pollen: Secondary | ICD-10-CM | POA: Diagnosis not present

## 2021-12-01 DIAGNOSIS — J3081 Allergic rhinitis due to animal (cat) (dog) hair and dander: Secondary | ICD-10-CM | POA: Diagnosis not present

## 2021-12-01 DIAGNOSIS — J301 Allergic rhinitis due to pollen: Secondary | ICD-10-CM | POA: Diagnosis not present

## 2021-12-01 DIAGNOSIS — J3089 Other allergic rhinitis: Secondary | ICD-10-CM | POA: Diagnosis not present

## 2021-12-09 DIAGNOSIS — J3089 Other allergic rhinitis: Secondary | ICD-10-CM | POA: Diagnosis not present

## 2021-12-09 DIAGNOSIS — J301 Allergic rhinitis due to pollen: Secondary | ICD-10-CM | POA: Diagnosis not present

## 2021-12-09 DIAGNOSIS — J3081 Allergic rhinitis due to animal (cat) (dog) hair and dander: Secondary | ICD-10-CM | POA: Diagnosis not present

## 2021-12-23 DIAGNOSIS — J3081 Allergic rhinitis due to animal (cat) (dog) hair and dander: Secondary | ICD-10-CM | POA: Diagnosis not present

## 2021-12-23 DIAGNOSIS — J301 Allergic rhinitis due to pollen: Secondary | ICD-10-CM | POA: Diagnosis not present

## 2021-12-23 DIAGNOSIS — J3089 Other allergic rhinitis: Secondary | ICD-10-CM | POA: Diagnosis not present

## 2021-12-30 DIAGNOSIS — J301 Allergic rhinitis due to pollen: Secondary | ICD-10-CM | POA: Diagnosis not present

## 2021-12-30 DIAGNOSIS — J3081 Allergic rhinitis due to animal (cat) (dog) hair and dander: Secondary | ICD-10-CM | POA: Diagnosis not present

## 2021-12-30 DIAGNOSIS — J3089 Other allergic rhinitis: Secondary | ICD-10-CM | POA: Diagnosis not present

## 2022-01-05 DIAGNOSIS — J301 Allergic rhinitis due to pollen: Secondary | ICD-10-CM | POA: Diagnosis not present

## 2022-01-05 DIAGNOSIS — J3081 Allergic rhinitis due to animal (cat) (dog) hair and dander: Secondary | ICD-10-CM | POA: Diagnosis not present

## 2022-01-05 DIAGNOSIS — J3089 Other allergic rhinitis: Secondary | ICD-10-CM | POA: Diagnosis not present

## 2022-01-20 DIAGNOSIS — J3081 Allergic rhinitis due to animal (cat) (dog) hair and dander: Secondary | ICD-10-CM | POA: Diagnosis not present

## 2022-01-20 DIAGNOSIS — J301 Allergic rhinitis due to pollen: Secondary | ICD-10-CM | POA: Diagnosis not present

## 2022-01-20 DIAGNOSIS — J3089 Other allergic rhinitis: Secondary | ICD-10-CM | POA: Diagnosis not present

## 2022-02-03 DIAGNOSIS — J301 Allergic rhinitis due to pollen: Secondary | ICD-10-CM | POA: Diagnosis not present

## 2022-02-03 DIAGNOSIS — J3081 Allergic rhinitis due to animal (cat) (dog) hair and dander: Secondary | ICD-10-CM | POA: Diagnosis not present

## 2022-02-03 DIAGNOSIS — J3089 Other allergic rhinitis: Secondary | ICD-10-CM | POA: Diagnosis not present

## 2022-02-12 ENCOUNTER — Other Ambulatory Visit: Payer: Self-pay | Admitting: Emergency Medicine

## 2022-02-12 DIAGNOSIS — I10 Essential (primary) hypertension: Secondary | ICD-10-CM

## 2022-02-16 DIAGNOSIS — J301 Allergic rhinitis due to pollen: Secondary | ICD-10-CM | POA: Diagnosis not present

## 2022-02-16 DIAGNOSIS — J3089 Other allergic rhinitis: Secondary | ICD-10-CM | POA: Diagnosis not present

## 2022-02-16 DIAGNOSIS — J3081 Allergic rhinitis due to animal (cat) (dog) hair and dander: Secondary | ICD-10-CM | POA: Diagnosis not present

## 2022-02-28 DIAGNOSIS — J3089 Other allergic rhinitis: Secondary | ICD-10-CM | POA: Diagnosis not present

## 2022-02-28 DIAGNOSIS — J301 Allergic rhinitis due to pollen: Secondary | ICD-10-CM | POA: Diagnosis not present

## 2022-02-28 DIAGNOSIS — J3081 Allergic rhinitis due to animal (cat) (dog) hair and dander: Secondary | ICD-10-CM | POA: Diagnosis not present

## 2022-03-09 DIAGNOSIS — J301 Allergic rhinitis due to pollen: Secondary | ICD-10-CM | POA: Diagnosis not present

## 2022-03-09 DIAGNOSIS — J3089 Other allergic rhinitis: Secondary | ICD-10-CM | POA: Diagnosis not present

## 2022-03-09 DIAGNOSIS — J3081 Allergic rhinitis due to animal (cat) (dog) hair and dander: Secondary | ICD-10-CM | POA: Diagnosis not present

## 2022-03-13 DIAGNOSIS — S6991XA Unspecified injury of right wrist, hand and finger(s), initial encounter: Secondary | ICD-10-CM | POA: Diagnosis not present

## 2022-03-13 DIAGNOSIS — M6740 Ganglion, unspecified site: Secondary | ICD-10-CM | POA: Diagnosis not present

## 2022-03-29 ENCOUNTER — Other Ambulatory Visit: Payer: Self-pay | Admitting: Emergency Medicine

## 2022-03-29 DIAGNOSIS — F411 Generalized anxiety disorder: Secondary | ICD-10-CM

## 2022-03-29 NOTE — Telephone Encounter (Signed)
Patient is requesting a refill of the following medications: Requested Prescriptions   Pending Prescriptions Disp Refills   ALPRAZolam (XANAX) 0.25 MG tablet [Pharmacy Med Name: ALPRAZOLAM 0.'25MG'$  TABLETS] 30 tablet     Sig: TAKE 1 TABLET(0.25 MG) BY MOUTH DAILY AS NEEDED FOR ANXIETY    Date of patient request: 03/29/2022 Last office visit: 07/20/2021 Date of last refill: 12/22/2021 Last refill amount: 30 tabs Follow up time period per chart: no f/u visit on file

## 2022-03-30 DIAGNOSIS — J069 Acute upper respiratory infection, unspecified: Secondary | ICD-10-CM | POA: Diagnosis not present

## 2022-03-30 DIAGNOSIS — K581 Irritable bowel syndrome with constipation: Secondary | ICD-10-CM | POA: Diagnosis not present

## 2022-03-30 DIAGNOSIS — K602 Anal fissure, unspecified: Secondary | ICD-10-CM | POA: Diagnosis not present

## 2022-03-31 DIAGNOSIS — J3089 Other allergic rhinitis: Secondary | ICD-10-CM | POA: Diagnosis not present

## 2022-03-31 DIAGNOSIS — J3081 Allergic rhinitis due to animal (cat) (dog) hair and dander: Secondary | ICD-10-CM | POA: Diagnosis not present

## 2022-03-31 DIAGNOSIS — J301 Allergic rhinitis due to pollen: Secondary | ICD-10-CM | POA: Diagnosis not present

## 2022-04-03 DIAGNOSIS — J3089 Other allergic rhinitis: Secondary | ICD-10-CM | POA: Diagnosis not present

## 2022-04-03 DIAGNOSIS — J3081 Allergic rhinitis due to animal (cat) (dog) hair and dander: Secondary | ICD-10-CM | POA: Diagnosis not present

## 2022-04-03 DIAGNOSIS — J301 Allergic rhinitis due to pollen: Secondary | ICD-10-CM | POA: Diagnosis not present

## 2022-04-06 DIAGNOSIS — Z03818 Encounter for observation for suspected exposure to other biological agents ruled out: Secondary | ICD-10-CM | POA: Diagnosis not present

## 2022-04-06 DIAGNOSIS — J028 Acute pharyngitis due to other specified organisms: Secondary | ICD-10-CM | POA: Diagnosis not present

## 2022-04-06 DIAGNOSIS — B9789 Other viral agents as the cause of diseases classified elsewhere: Secondary | ICD-10-CM | POA: Diagnosis not present

## 2022-04-10 ENCOUNTER — Other Ambulatory Visit: Payer: Self-pay | Admitting: Emergency Medicine

## 2022-04-10 DIAGNOSIS — I10 Essential (primary) hypertension: Secondary | ICD-10-CM

## 2022-04-17 DIAGNOSIS — J3081 Allergic rhinitis due to animal (cat) (dog) hair and dander: Secondary | ICD-10-CM | POA: Diagnosis not present

## 2022-04-17 DIAGNOSIS — J3089 Other allergic rhinitis: Secondary | ICD-10-CM | POA: Diagnosis not present

## 2022-04-17 DIAGNOSIS — J301 Allergic rhinitis due to pollen: Secondary | ICD-10-CM | POA: Diagnosis not present

## 2022-04-19 ENCOUNTER — Ambulatory Visit: Payer: Medicare PPO | Admitting: Emergency Medicine

## 2022-04-19 ENCOUNTER — Encounter: Payer: Self-pay | Admitting: Emergency Medicine

## 2022-04-19 VITALS — BP 138/76 | Temp 98.4°F | Ht 66.0 in | Wt 146.0 lb

## 2022-04-19 DIAGNOSIS — K581 Irritable bowel syndrome with constipation: Secondary | ICD-10-CM

## 2022-04-19 DIAGNOSIS — I10 Essential (primary) hypertension: Secondary | ICD-10-CM | POA: Diagnosis not present

## 2022-04-19 LAB — COMPREHENSIVE METABOLIC PANEL
ALT: 19 U/L (ref 0–35)
AST: 23 U/L (ref 0–37)
Albumin: 4.3 g/dL (ref 3.5–5.2)
Alkaline Phosphatase: 95 U/L (ref 39–117)
BUN: 16 mg/dL (ref 6–23)
CO2: 32 mEq/L (ref 19–32)
Calcium: 10.8 mg/dL — ABNORMAL HIGH (ref 8.4–10.5)
Chloride: 98 mEq/L (ref 96–112)
Creatinine, Ser: 0.72 mg/dL (ref 0.40–1.20)
GFR: 81.84 mL/min (ref >60.00)
Glucose, Bld: 103 mg/dL — ABNORMAL HIGH (ref 70–99)
Potassium: 3.9 mEq/L (ref 3.5–5.1)
Sodium: 136 mEq/L (ref 135–145)
Total Bilirubin: 0.6 mg/dL (ref 0.2–1.2)
Total Protein: 7.6 g/dL (ref 6.0–8.3)

## 2022-04-19 LAB — CBC WITH DIFFERENTIAL/PLATELET
Basophils Absolute: 0.1 10*3/uL (ref 0.0–0.1)
Basophils Relative: 0.6 % (ref 0.0–3.0)
Eosinophils Absolute: 0.1 10*3/uL (ref 0.0–0.7)
Eosinophils Relative: 1.6 % (ref 0.0–5.0)
HCT: 43.1 % (ref 36.0–46.0)
Hemoglobin: 15.1 g/dL — ABNORMAL HIGH (ref 12.0–15.0)
Lymphocytes Relative: 20 % (ref 12.0–46.0)
Lymphs Abs: 1.8 10*3/uL (ref 0.7–4.0)
MCHC: 34.9 g/dL (ref 30.0–36.0)
MCV: 89 fl (ref 78.0–100.0)
Monocytes Absolute: 0.7 10*3/uL (ref 0.1–1.0)
Monocytes Relative: 7.5 % (ref 3.0–12.0)
Neutro Abs: 6.2 10*3/uL (ref 1.4–7.7)
Neutrophils Relative %: 70.3 % (ref 43.0–77.0)
Platelets: 258 10*3/uL (ref 150.0–400.0)
RBC: 4.84 Mil/uL (ref 3.87–5.11)
RDW: 13.2 % (ref 11.5–15.5)
WBC: 8.8 10*3/uL (ref 4.0–10.5)

## 2022-04-19 LAB — LIPID PANEL
Cholesterol: 173 mg/dL (ref 0–200)
HDL: 60.5 mg/dL (ref >39.00)
LDL Cholesterol: 99 mg/dL (ref 0–99)
NonHDL: 112.95
Total CHOL/HDL Ratio: 3
Triglycerides: 69 mg/dL (ref 0.0–149.0)
VLDL: 13.8 mg/dL (ref 0.0–40.0)

## 2022-04-19 LAB — HEMOGLOBIN A1C: Hgb A1c MFr Bld: 5.9 % (ref 4.6–6.5)

## 2022-04-19 NOTE — Assessment & Plan Note (Signed)
Well-controlled hypertension. Cardiovascular risk associated with hypertension discussed. Diet and nutrition discussed. Continue hydrochlorothiazide 25 mg daily and metoprolol tartrate 25 mg twice a day. Continue daily baby aspirin.

## 2022-04-19 NOTE — Patient Instructions (Signed)
Health Maintenance After Age 75 After age 75, you are at a higher risk for certain long-term diseases and infections as well as injuries from falls. Falls are a major cause of broken bones and head injuries in people who are older than age 75. Getting regular preventive care can help to keep you healthy and well. Preventive care includes getting regular testing and making lifestyle changes as recommended by your health care provider. Talk with your health care provider about: Which screenings and tests you should have. A screening is a test that checks for a disease when you have no symptoms. A diet and exercise plan that is right for you. What should I know about screenings and tests to prevent falls? Screening and testing are the best ways to find a health problem early. Early diagnosis and treatment give you the best chance of managing medical conditions that are common after age 75. Certain conditions and lifestyle choices may make you more likely to have a fall. Your health care provider may recommend: Regular vision checks. Poor vision and conditions such as cataracts can make you more likely to have a fall. If you wear glasses, make sure to get your prescription updated if your vision changes. Medicine review. Work with your health care provider to regularly review all of the medicines you are taking, including over-the-counter medicines. Ask your health care provider about any side effects that may make you more likely to have a fall. Tell your health care provider if any medicines that you take make you feel dizzy or sleepy. Strength and balance checks. Your health care provider may recommend certain tests to check your strength and balance while standing, walking, or changing positions. Foot health exam. Foot pain and numbness, as well as not wearing proper footwear, can make you more likely to have a fall. Screenings, including: Osteoporosis screening. Osteoporosis is a condition that causes  the bones to get weaker and break more easily. Blood pressure screening. Blood pressure changes and medicines to control blood pressure can make you feel dizzy. Depression screening. You may be more likely to have a fall if you have a fear of falling, feel depressed, or feel unable to do activities that you used to do. Alcohol use screening. Using too much alcohol can affect your balance and may make you more likely to have a fall. Follow these instructions at home: Lifestyle Do not drink alcohol if: Your health care provider tells you not to drink. If you drink alcohol: Limit how much you have to: 0-1 drink a day for women. 0-2 drinks a day for men. Know how much alcohol is in your drink. In the [place] one drink equals one 12 oz bottle of beer (355 mL), one 5 oz glass of wine (148 mL), or one 1 oz glass of hard liquor (44 mL). Do not use any products that contain nicotine or tobacco. These products include cigarettes, chewing tobacco, and vaping devices, such as e-cigarettes. If you need help quitting, ask your health care provider. Activity  Follow a regular exercise program to stay fit. This will help you maintain your balance. Ask your health care provider what types of exercise are appropriate for you. If you need a cane or walker, use it as recommended by your health care provider. Wear supportive shoes that have nonskid soles. Safety  Remove any tripping hazards, such as rugs, cords, and clutter. Install safety equipment such as grab bars in bathrooms and safety rails on stairs. Keep rooms and walkways   well-lit. General instructions Talk with your health care provider about your risks for falling. Tell your health care provider if: You fall. Be sure to tell your health care provider about all falls, even ones that seem minor. You feel dizzy, tiredness (fatigue), or off-balance. Take over-the-counter and prescription medicines only as told by your health care provider. These include  supplements. Eat a healthy diet and maintain a healthy weight. A healthy diet includes low-fat dairy products, low-fat (lean) meats, and fiber from whole grains, beans, and lots of fruits and vegetables. Stay current with your vaccines. Schedule regular health, dental, and eye exams. Summary Having a healthy lifestyle and getting preventive care can help to protect your health and wellness after age 75. Screening and testing are the best way to find a health problem early and help you avoid having a fall. Early diagnosis and treatment give you the best chance for managing medical conditions that are more common for people who are older than age 75. Falls are a major cause of broken bones and head injuries in people who are older than age 75. Take precautions to prevent a fall at home. Work with your health care provider to learn what changes you can make to improve your health and wellness and to prevent falls. This information is not intended to replace advice given to you by your health care provider. Make sure you discuss any questions you have with your health care provider. Document Revised: 11/15/2020 Document Reviewed: 11/15/2020 Elsevier Patient Education  2023 Elsevier Inc.  

## 2022-04-19 NOTE — Assessment & Plan Note (Addendum)
Stable and well-controlled.  No concerns. Takes MiraLAX as needed.

## 2022-04-19 NOTE — Progress Notes (Signed)
Ruth Walker 76 y.o.   Chief Complaint  Patient presents with   Follow-up    HISTORY OF PRESENT ILLNESS: This is a 76 y.o. female A1A with history hypertension here for follow-up.  Overall doing well. Has no complaints or medical concerns today.  HPI   Prior to Admission medications   Medication Sig Start Date End Date Taking? Authorizing Provider  ALPRAZolam (XANAX) 0.25 MG tablet TAKE 1 TABLET(0.25 MG) BY MOUTH DAILY AS NEEDED FOR ANXIETY 03/29/22   Allahna Husband, Ines Bloomer, MD  AMBULATORY NON FORMULARY MEDICATION Medication Name: Nitroglycerin 0.125% gel, apply rectally tid for 6-8 weeks 02/26/19   Thornton Park, MD  aspirin 81 MG tablet Take 81 mg by mouth daily.      [provider]  Calcium Carbonate (CALCIUM 600 PO) Take 1 tablet by mouth 2 (two) times daily.    [provider]  cycloSPORINE (RESTASIS) 0.05 % ophthalmic emulsion 1 drop 2 (two) times daily.   Patient not taking: Reported on 11/21/2021    [provider]  estradiol (ESTRACE) 0.1 MG/GM vaginal cream Place 1.61 Applicatorfuls vaginally 2 (two) times a week. Patient not taking: Reported on 11/21/2021 02/24/15   Roselee Culver, MD  fluticasone [facility]) 50 MCG/ACT nasal spray Place 2 sprays into both nostrils as needed. 09/16/18   Horald Pollen, MD  hydrochlorothiazide (HYDRODIURIL) 25 MG tablet TAKE 1 TABLET(25 MG) BY MOUTH DAILY 02/12/22   Horald Pollen, MD  loratadine (CLARITIN) 10 MG tablet TAKE 1 TABLET(10 MG) BY MOUTH DAILY 04/17/20   Horald Pollen, MD  metoprolol tartrate (LOPRESSOR) 25 MG tablet TAKE 1 TABLET(25 MG) BY MOUTH TWICE DAILY 04/10/22   Horald Pollen, MD  polyethylene glycol [facility] / GLYCOLAX) packet Take 17 g by mouth daily. Reported on 07/10/2015    [provider]  PRESCRIPTION MEDICATION Allergy injection once a week.[facility]    [provider]  Probiotic Product (ALIGN) 4 MG CAPS Take 1  capsule by mouth daily. 07/05/11   Sable Feil, MD  Wheat Dextrin (BENEFIBER DRINK MIX PO) Take 10 mLs by mouth every morning. Take 2 teaspoons every morning   Patient not taking: Reported on 11/21/2021    [provider]  Linaclotide (LINZESS) 145 MCG CAPS Take 1 capsule by mouth daily. 09/05/11 10/03/11  Sable Feil, MD    Allergies  Allergen Reactions   Benadryl [Diphenhydramine Hcl] Other (See Comments)    Makes her feel "hyper"   Cephalexin     REACTION: coarse rash   Ciprofloxacin    Mucinex [Guaifenesin Er] Other (See Comments)    Makes her feel worse   Shellfish Allergy Hives   Sudafed [Pseudoephedrine Hcl] Hives and Other (See Comments)    Itching and hives asthma   Sulfonamide Derivatives Hives   Aloe Rash    Patient Active Problem List   Diagnosis Date Noted   Uterine polyp    Vertebral compression fracture (HCC)    IBS (irritable bowel syndrome) 09/05/2011   Constipation, slow transit 09/05/2011   Anxiety disorder 09/05/2011   Diverticulosis 11/04/2010   Gallstones 11/04/2010   ANAL FISSURE 05/26/2008   OBSESSIVE-COMPULSIVE PERSONALITY DISORDER 02/28/2008   Essential hypertension 02/28/2008   CYSTITIS, CHRONIC 02/28/2008    Past Medical History:  Diagnosis Date   Allergy    Anal fissure    Anxiety states    Asthma    Gets allergy shots once a week    Cholelithiasis    Depression  Diverticulosis    Environmental allergies    Hemorrhoids    Irritable bowel syndrome (IBS)    Osteoporosis    Skin cancer    Leg   Uterine polyp    Vertebral compression fracture (HCC)     Past Surgical History:  Procedure Laterality Date   BACK SURGERY     CESAREAN SECTION     DILATION AND CURETTAGE OF UTERUS     Endometrial polypectomy     FRACTURE SURGERY     neck (vertebrae)   HEMORRHOID SURGERY     HYSTEROSCOPY     IBS     MOUTH SURGERY     SKIN CANCER EXCISION     Leg     Social History   Socioeconomic History   Marital  status: Married    Spouse name: Not on file   Number of children: 2   Years of education: some college   Highest education level: Not on file  Occupational History   Occupation: Retired  Tobacco Use   Smoking status: Never   Smokeless tobacco: Never  Vaping Use   Vaping Use: Never used  Substance and Sexual Activity   Alcohol use: No    Alcohol/week: 0.0 standard drinks of alcohol   Drug use: No   Sexual activity: Yes    Birth control/protection: Post-menopausal    Comment: number of sex partners in the last 60 months  1  Other Topics Concern   Not on file  Social History Narrative   Patient gets regular exercise. Cares for her grandchildren.   Social Determinants of Health   Financial Resource Strain: Low Risk  (11/21/2021)   Overall Financial Resource Strain (CARDIA)    Difficulty of Paying Living Expenses: Not hard at all  Food Insecurity: No Food Insecurity (11/21/2021)   Hunger Vital Sign    Worried About Running Out of Food in the Last Year: Never true    Ran Out of Food in the Last Year: Never true  Transportation Needs: No Transportation Needs (11/21/2021)   PRAPARE - Hydrologist (Medical): No    Lack of Transportation (Non-Medical): No  Physical Activity: Insufficiently Active (11/21/2021)   Exercise Vital Sign    Days of Exercise per Week: 3 days    Minutes of Exercise per Session: 30 min  Stress: No Stress Concern Present (11/21/2021)   Liberal    Feeling of Stress : Only a little  Social Connections: Moderately Integrated (11/21/2021)   Social Connection and Isolation Panel [NHANES]    Frequency of Communication with Friends and Family: Three times a week    Frequency of Social Gatherings with Friends and Family: Three times a week    Attends Religious Services: More than 4 times per year    Active Member of Clubs or Organizations: No    Attends Theatre manager Meetings: Never    Marital Status: Married  Human resources officer Violence: Not At Risk (11/21/2021)   Humiliation, Afraid, Rape, and Kick questionnaire    Fear of Current or Ex-Partner: No    Emotionally Abused: No    Physically Abused: No    Sexually Abused: No    Family History  Problem Relation Age of Onset   Diabetes Mother    Hypertension Mother    Thyroid disease Mother    Hypertension Father    Heart disease Father    Thyroid disease Sister    Colon  cancer Neg Hx      Review of Systems  Constitutional: Negative.  Negative for chills and fever.  HENT: Negative.  Negative for congestion and sore throat.   Respiratory: Negative.  Negative for cough and shortness of breath.   Cardiovascular:  Negative for chest pain and palpitations.  Gastrointestinal:  Negative for abdominal pain, nausea and vomiting.  Genitourinary: Negative.  Negative for dysuria.  Musculoskeletal: Negative.   Skin: Negative.  Negative for rash.  Neurological: Negative.  Negative for dizziness and headaches.  All other systems reviewed and are negative.  Today's Vitals   04/19/22 1305  BP: 138/76  Temp: 98.4 F (36.9 C)  TempSrc: Oral  Weight: 146 lb (66.2 kg)  Height: '5\' 6"'$  (1.676 m)   Body mass index is 23.57 kg/m.   Physical Exam Vitals reviewed.  Constitutional:      Appearance: Normal appearance.  HENT:     Head: Normocephalic.     Right Ear: Tympanic membrane, ear canal and external ear normal.     Left Ear: Tympanic membrane, ear canal and external ear normal.     Mouth/Throat:     Mouth: Mucous membranes are moist.     Pharynx: Oropharynx is clear.  Eyes:     Extraocular Movements: Extraocular movements intact.     Conjunctiva/sclera: Conjunctivae normal.     Pupils: Pupils are equal, round, and reactive to light.  Cardiovascular:     Rate and Rhythm: Normal rate and regular rhythm.     Pulses: Normal pulses.     Heart sounds: Normal heart sounds.  Pulmonary:      Effort: Pulmonary effort is normal.     Breath sounds: Normal breath sounds.  Abdominal:     Palpations: Abdomen is soft.     Tenderness: There is no abdominal tenderness.  Musculoskeletal:     Cervical back: No tenderness.     Right lower leg: No edema.     Left lower leg: No edema.     Comments: Bilateral varicose veins  Lymphadenopathy:     Cervical: No cervical adenopathy.  Skin:    General: Skin is warm and dry.  Neurological:     General: No focal deficit present.     Mental Status: She is alert and oriented to person, place, and time.  Psychiatric:        Mood and Affect: Mood normal.        Behavior: Behavior normal.      ASSESSMENT & PLAN: A total of 45 minutes was spent with the patient and counseling/coordination of care regarding preparing for this visit, review of most recent office visit notes, review of multiple chronic medical problems and their management, review of all medications, diagnosis of hypertension and cardiovascular risks associated with this condition, education on nutrition, prognosis, need for blood work today, documentation and need for follow-up in 6 months.  Problem List Items Addressed This Visit       Cardiovascular and Mediastinum   Essential hypertension - Primary    Well-controlled hypertension. Cardiovascular risk associated with hypertension discussed. Diet and nutrition discussed. Continue hydrochlorothiazide 25 mg daily and metoprolol tartrate 25 mg twice a day. Continue daily baby aspirin.      Relevant Orders   Comprehensive metabolic panel   Hemoglobin A1c   Lipid panel   CBC with Differential/Platelet     Digestive   IBS (irritable bowel syndrome)    Stable and well-controlled.  No concerns. Takes MiraLAX as needed.  Patient Instructions  Health Maintenance After Age 63 After age 15, you are at a higher risk for certain long-term diseases and infections as well as injuries from falls. Falls are a major cause of  broken bones and head injuries in people who are older than age 65. Getting regular preventive care can help to keep you healthy and well. Preventive care includes getting regular testing and making lifestyle changes as recommended by your health care provider. Talk with your health care provider about: Which screenings and tests you should have. A screening is a test that checks for a disease when you have no symptoms. A diet and exercise plan that is right for you. What should I know about screenings and tests to prevent falls? Screening and testing are the best ways to find a health problem early. Early diagnosis and treatment give you the best chance of managing medical conditions that are common after age 23. Certain conditions and lifestyle choices may make you more likely to have a fall. Your health care provider may recommend: Regular vision checks. Poor vision and conditions such as cataracts can make you more likely to have a fall. If you wear glasses, make sure to get your prescription updated if your vision changes. Medicine review. Work with your health care provider to regularly review all of the medicines you are taking, including over-the-counter medicines. Ask your health care provider about any side effects that may make you more likely to have a fall. Tell your health care provider if any medicines that you take make you feel dizzy or sleepy. Strength and balance checks. Your health care provider may recommend certain tests to check your strength and balance while standing, walking, or changing positions. Foot health exam. Foot pain and numbness, as well as not wearing proper footwear, can make you more likely to have a fall. Screenings, including: Osteoporosis screening. Osteoporosis is a condition that causes the bones to get weaker and break more easily. Blood pressure screening. Blood pressure changes and medicines to control blood pressure can make you feel dizzy. Depression  screening. You may be more likely to have a fall if you have a fear of falling, feel depressed, or feel unable to do activities that you used to do. Alcohol use screening. Using too much alcohol can affect your balance and may make you more likely to have a fall. Follow these instructions at home: Lifestyle Do not drink alcohol if: Your health care provider tells you not to drink. If you drink alcohol: Limit how much you have to: 0-1 drink a day for women. 0-2 drinks a day for men. Know how much alcohol is in your drink. In the [place] one drink equals one 12 oz bottle of beer (355 mL), one 5 oz glass of wine (148 mL), or one 1 oz glass of hard liquor (44 mL). Do not use any products that contain nicotine or tobacco. These products include cigarettes, chewing tobacco, and vaping devices, such as e-cigarettes. If you need help quitting, ask your health care provider. Activity  Follow a regular exercise program to stay fit. This will help you maintain your balance. Ask your health care provider what types of exercise are appropriate for you. If you need a cane or walker, use it as recommended by your health care provider. Wear supportive shoes that have nonskid soles. Safety  Remove any tripping hazards, such as rugs, cords, and clutter. Install safety equipment such as grab bars in bathrooms and safety rails on stairs.  Keep rooms and walkways well-lit. General instructions Talk with your health care provider about your risks for falling. Tell your health care provider if: You fall. Be sure to tell your health care provider about all falls, even ones that seem minor. You feel dizzy, tiredness (fatigue), or off-balance. Take over-the-counter and prescription medicines only as told by your health care provider. These include supplements. Eat a healthy diet and maintain a healthy weight. A healthy diet includes low-fat dairy products, low-fat (lean) meats, and fiber from whole grains, beans, and  lots of fruits and vegetables. Stay current with your vaccines. Schedule regular health, dental, and eye exams. Summary Having a healthy lifestyle and getting preventive care can help to protect your health and wellness after age 43. Screening and testing are the best way to find a health problem early and help you avoid having a fall. Early diagnosis and treatment give you the best chance for managing medical conditions that are more common for people who are older than age 39. Falls are a major cause of broken bones and head injuries in people who are older than age 75. Take precautions to prevent a fall at home. Work with your health care provider to learn what changes you can make to improve your health and wellness and to prevent falls. This information is not intended to replace advice given to you by your health care provider. Make sure you discuss any questions you have with your health care provider. Document Revised: 11/15/2020 Document Reviewed: 11/15/2020 Elsevier Patient Education  [place], MD [facility]

## 2022-04-23 ENCOUNTER — Other Ambulatory Visit: Payer: Self-pay | Admitting: Emergency Medicine

## 2022-04-23 DIAGNOSIS — I10 Essential (primary) hypertension: Secondary | ICD-10-CM

## 2022-04-24 ENCOUNTER — Telehealth: Payer: Self-pay | Admitting: Emergency Medicine

## 2022-04-24 NOTE — Telephone Encounter (Signed)
Patient needs a refill on her hydrochlorityside and her metoprolol  - Please send to [facility] on [address], [place]

## 2022-04-24 NOTE — Telephone Encounter (Signed)
Called patient to inform her that her medication was sent to her pharmacy on file yesterday.

## 2022-05-02 DIAGNOSIS — J3089 Other allergic rhinitis: Secondary | ICD-10-CM | POA: Diagnosis not present

## 2022-05-02 DIAGNOSIS — J301 Allergic rhinitis due to pollen: Secondary | ICD-10-CM | POA: Diagnosis not present

## 2022-05-02 DIAGNOSIS — J3081 Allergic rhinitis due to animal (cat) (dog) hair and dander: Secondary | ICD-10-CM | POA: Diagnosis not present

## 2022-05-03 DIAGNOSIS — S0500XA Injury of conjunctiva and corneal abrasion without foreign body, unspecified eye, initial encounter: Secondary | ICD-10-CM | POA: Diagnosis not present

## 2022-05-04 DIAGNOSIS — S0500XA Injury of conjunctiva and corneal abrasion without foreign body, unspecified eye, initial encounter: Secondary | ICD-10-CM | POA: Diagnosis not present

## 2022-05-18 DIAGNOSIS — J301 Allergic rhinitis due to pollen: Secondary | ICD-10-CM | POA: Diagnosis not present

## 2022-05-18 DIAGNOSIS — J3089 Other allergic rhinitis: Secondary | ICD-10-CM | POA: Diagnosis not present

## 2022-05-18 DIAGNOSIS — J3081 Allergic rhinitis due to animal (cat) (dog) hair and dander: Secondary | ICD-10-CM | POA: Diagnosis not present

## 2022-06-05 DIAGNOSIS — J3089 Other allergic rhinitis: Secondary | ICD-10-CM | POA: Diagnosis not present

## 2022-06-05 DIAGNOSIS — J3081 Allergic rhinitis due to animal (cat) (dog) hair and dander: Secondary | ICD-10-CM | POA: Diagnosis not present

## 2022-06-05 DIAGNOSIS — J301 Allergic rhinitis due to pollen: Secondary | ICD-10-CM | POA: Diagnosis not present

## 2022-06-19 DIAGNOSIS — H10413 Chronic giant papillary conjunctivitis, bilateral: Secondary | ICD-10-CM | POA: Diagnosis not present

## 2022-06-19 DIAGNOSIS — H2513 Age-related nuclear cataract, bilateral: Secondary | ICD-10-CM | POA: Diagnosis not present

## 2022-06-19 DIAGNOSIS — H04123 Dry eye syndrome of bilateral lacrimal glands: Secondary | ICD-10-CM | POA: Diagnosis not present

## 2022-06-20 DIAGNOSIS — J3089 Other allergic rhinitis: Secondary | ICD-10-CM | POA: Diagnosis not present

## 2022-06-20 DIAGNOSIS — J301 Allergic rhinitis due to pollen: Secondary | ICD-10-CM | POA: Diagnosis not present

## 2022-06-20 DIAGNOSIS — J3081 Allergic rhinitis due to animal (cat) (dog) hair and dander: Secondary | ICD-10-CM | POA: Diagnosis not present

## 2022-06-25 DIAGNOSIS — H6123 Impacted cerumen, bilateral: Secondary | ICD-10-CM | POA: Diagnosis not present

## 2022-06-25 DIAGNOSIS — R040 Epistaxis: Secondary | ICD-10-CM | POA: Diagnosis not present

## 2022-06-25 DIAGNOSIS — H01005 Unspecified blepharitis left lower eyelid: Secondary | ICD-10-CM | POA: Diagnosis not present

## 2022-06-25 DIAGNOSIS — R0981 Nasal congestion: Secondary | ICD-10-CM | POA: Diagnosis not present

## 2022-07-05 DIAGNOSIS — J301 Allergic rhinitis due to pollen: Secondary | ICD-10-CM | POA: Diagnosis not present

## 2022-07-05 DIAGNOSIS — J3089 Other allergic rhinitis: Secondary | ICD-10-CM | POA: Diagnosis not present

## 2022-07-05 DIAGNOSIS — J3081 Allergic rhinitis due to animal (cat) (dog) hair and dander: Secondary | ICD-10-CM | POA: Diagnosis not present

## 2022-07-17 ENCOUNTER — Other Ambulatory Visit: Payer: Self-pay | Admitting: Emergency Medicine

## 2022-07-17 DIAGNOSIS — F411 Generalized anxiety disorder: Secondary | ICD-10-CM

## 2022-07-17 NOTE — Telephone Encounter (Signed)
Patient is requesting a refill of the following medications: Requested Prescriptions   Pending Prescriptions Disp Refills   ALPRAZolam (XANAX) 0.25 MG tablet [Pharmacy Med Name: ALPRAZOLAM 0.'25MG'$  TABLETS] 30 tablet     Sig: TAKE 1 TABLET(0.25 MG) BY MOUTH DAILY AS NEEDED FOR ANXIETY    Date of patient request: 07/17/2022 Last office visit: 04/19/2022 Date of last refill: 05/01/2022 Last refill amount: 30 tabs  Follow up time period per chart: no f/u appt on file

## 2022-07-19 DIAGNOSIS — J301 Allergic rhinitis due to pollen: Secondary | ICD-10-CM | POA: Diagnosis not present

## 2022-07-19 DIAGNOSIS — J3089 Other allergic rhinitis: Secondary | ICD-10-CM | POA: Diagnosis not present

## 2022-07-19 DIAGNOSIS — J3081 Allergic rhinitis due to animal (cat) (dog) hair and dander: Secondary | ICD-10-CM | POA: Diagnosis not present

## 2022-07-31 DIAGNOSIS — J3089 Other allergic rhinitis: Secondary | ICD-10-CM | POA: Diagnosis not present

## 2022-07-31 DIAGNOSIS — J301 Allergic rhinitis due to pollen: Secondary | ICD-10-CM | POA: Diagnosis not present

## 2022-07-31 DIAGNOSIS — J3081 Allergic rhinitis due to animal (cat) (dog) hair and dander: Secondary | ICD-10-CM | POA: Diagnosis not present

## 2022-08-08 DIAGNOSIS — K602 Anal fissure, unspecified: Secondary | ICD-10-CM | POA: Diagnosis not present

## 2022-08-08 DIAGNOSIS — K581 Irritable bowel syndrome with constipation: Secondary | ICD-10-CM | POA: Diagnosis not present

## 2022-08-16 DIAGNOSIS — J3081 Allergic rhinitis due to animal (cat) (dog) hair and dander: Secondary | ICD-10-CM | POA: Diagnosis not present

## 2022-08-16 DIAGNOSIS — J3089 Other allergic rhinitis: Secondary | ICD-10-CM | POA: Diagnosis not present

## 2022-08-16 DIAGNOSIS — J301 Allergic rhinitis due to pollen: Secondary | ICD-10-CM | POA: Diagnosis not present

## 2022-09-01 DIAGNOSIS — J3081 Allergic rhinitis due to animal (cat) (dog) hair and dander: Secondary | ICD-10-CM | POA: Diagnosis not present

## 2022-09-01 DIAGNOSIS — J301 Allergic rhinitis due to pollen: Secondary | ICD-10-CM | POA: Diagnosis not present

## 2022-09-01 DIAGNOSIS — J3089 Other allergic rhinitis: Secondary | ICD-10-CM | POA: Diagnosis not present

## 2022-09-07 DIAGNOSIS — J3081 Allergic rhinitis due to animal (cat) (dog) hair and dander: Secondary | ICD-10-CM | POA: Diagnosis not present

## 2022-09-07 DIAGNOSIS — J3089 Other allergic rhinitis: Secondary | ICD-10-CM | POA: Diagnosis not present

## 2022-09-07 DIAGNOSIS — J301 Allergic rhinitis due to pollen: Secondary | ICD-10-CM | POA: Diagnosis not present

## 2022-09-21 DIAGNOSIS — J3089 Other allergic rhinitis: Secondary | ICD-10-CM | POA: Diagnosis not present

## 2022-09-21 DIAGNOSIS — J301 Allergic rhinitis due to pollen: Secondary | ICD-10-CM | POA: Diagnosis not present

## 2022-09-21 DIAGNOSIS — J3081 Allergic rhinitis due to animal (cat) (dog) hair and dander: Secondary | ICD-10-CM | POA: Diagnosis not present

## 2022-09-26 ENCOUNTER — Other Ambulatory Visit: Payer: Self-pay | Admitting: Emergency Medicine

## 2022-09-26 DIAGNOSIS — I10 Essential (primary) hypertension: Secondary | ICD-10-CM

## 2022-10-03 DIAGNOSIS — J301 Allergic rhinitis due to pollen: Secondary | ICD-10-CM | POA: Diagnosis not present

## 2022-10-03 DIAGNOSIS — J3089 Other allergic rhinitis: Secondary | ICD-10-CM | POA: Diagnosis not present

## 2022-10-03 DIAGNOSIS — J3081 Allergic rhinitis due to animal (cat) (dog) hair and dander: Secondary | ICD-10-CM | POA: Diagnosis not present

## 2022-10-23 DIAGNOSIS — J3081 Allergic rhinitis due to animal (cat) (dog) hair and dander: Secondary | ICD-10-CM | POA: Diagnosis not present

## 2022-10-23 DIAGNOSIS — J301 Allergic rhinitis due to pollen: Secondary | ICD-10-CM | POA: Diagnosis not present

## 2022-10-23 DIAGNOSIS — J3089 Other allergic rhinitis: Secondary | ICD-10-CM | POA: Diagnosis not present

## 2022-11-07 DIAGNOSIS — J3081 Allergic rhinitis due to animal (cat) (dog) hair and dander: Secondary | ICD-10-CM | POA: Diagnosis not present

## 2022-11-07 DIAGNOSIS — J3089 Other allergic rhinitis: Secondary | ICD-10-CM | POA: Diagnosis not present

## 2022-11-07 DIAGNOSIS — J301 Allergic rhinitis due to pollen: Secondary | ICD-10-CM | POA: Diagnosis not present

## 2022-11-14 DIAGNOSIS — J3089 Other allergic rhinitis: Secondary | ICD-10-CM | POA: Diagnosis not present

## 2022-11-14 DIAGNOSIS — J3081 Allergic rhinitis due to animal (cat) (dog) hair and dander: Secondary | ICD-10-CM | POA: Diagnosis not present

## 2022-11-14 DIAGNOSIS — J301 Allergic rhinitis due to pollen: Secondary | ICD-10-CM | POA: Diagnosis not present

## 2022-11-16 ENCOUNTER — Telehealth: Payer: Self-pay | Admitting: Emergency Medicine

## 2022-11-16 NOTE — Telephone Encounter (Signed)
Contacted Ruth Walker to schedule their annual wellness visit. Appointment made for 11/28/2022.  [facility] Care Guide [facility] AWV TEAM Direct Dial: [phone]

## 2022-11-30 DIAGNOSIS — J3081 Allergic rhinitis due to animal (cat) (dog) hair and dander: Secondary | ICD-10-CM | POA: Diagnosis not present

## 2022-11-30 DIAGNOSIS — J3089 Other allergic rhinitis: Secondary | ICD-10-CM | POA: Diagnosis not present

## 2022-11-30 DIAGNOSIS — J301 Allergic rhinitis due to pollen: Secondary | ICD-10-CM | POA: Diagnosis not present

## 2022-12-05 DIAGNOSIS — L308 Other specified dermatitis: Secondary | ICD-10-CM | POA: Diagnosis not present

## 2022-12-05 DIAGNOSIS — S76312A Strain of muscle, fascia and tendon of the posterior muscle group at thigh level, left thigh, initial encounter: Secondary | ICD-10-CM | POA: Diagnosis not present

## 2022-12-13 ENCOUNTER — Telehealth: Payer: Self-pay | Admitting: Emergency Medicine

## 2022-12-13 NOTE — Telephone Encounter (Signed)
Pt step on a fish hook. Please advise.

## 2022-12-13 NOTE — Telephone Encounter (Signed)
Called pt inform does not need tetanus. Pt had her TDAP last year 07/20/2021. Pt verbal understands../l,mb

## 2022-12-13 NOTE — Telephone Encounter (Signed)
Pt called wanting to know if she need a tetanus shot

## 2022-12-20 DIAGNOSIS — J301 Allergic rhinitis due to pollen: Secondary | ICD-10-CM | POA: Diagnosis not present

## 2022-12-20 DIAGNOSIS — J3089 Other allergic rhinitis: Secondary | ICD-10-CM | POA: Diagnosis not present

## 2022-12-20 DIAGNOSIS — J3081 Allergic rhinitis due to animal (cat) (dog) hair and dander: Secondary | ICD-10-CM | POA: Diagnosis not present

## 2022-12-26 DIAGNOSIS — S90221A Contusion of right lesser toe(s) with damage to nail, initial encounter: Secondary | ICD-10-CM | POA: Diagnosis not present

## 2022-12-27 DIAGNOSIS — L0889 Other specified local infections of the skin and subcutaneous tissue: Secondary | ICD-10-CM | POA: Diagnosis not present

## 2022-12-27 DIAGNOSIS — B354 Tinea corporis: Secondary | ICD-10-CM | POA: Diagnosis not present

## 2022-12-31 DIAGNOSIS — L0889 Other specified local infections of the skin and subcutaneous tissue: Secondary | ICD-10-CM | POA: Diagnosis not present

## 2022-12-31 DIAGNOSIS — B354 Tinea corporis: Secondary | ICD-10-CM | POA: Diagnosis not present

## 2023-01-04 DIAGNOSIS — J301 Allergic rhinitis due to pollen: Secondary | ICD-10-CM | POA: Diagnosis not present

## 2023-01-04 DIAGNOSIS — J3089 Other allergic rhinitis: Secondary | ICD-10-CM | POA: Diagnosis not present

## 2023-01-04 DIAGNOSIS — J3081 Allergic rhinitis due to animal (cat) (dog) hair and dander: Secondary | ICD-10-CM | POA: Diagnosis not present

## 2023-01-17 DIAGNOSIS — J3081 Allergic rhinitis due to animal (cat) (dog) hair and dander: Secondary | ICD-10-CM | POA: Diagnosis not present

## 2023-01-17 DIAGNOSIS — J3089 Other allergic rhinitis: Secondary | ICD-10-CM | POA: Diagnosis not present

## 2023-01-17 DIAGNOSIS — J301 Allergic rhinitis due to pollen: Secondary | ICD-10-CM | POA: Diagnosis not present

## 2023-01-29 DIAGNOSIS — M5412 Radiculopathy, cervical region: Secondary | ICD-10-CM | POA: Diagnosis not present

## 2023-01-29 DIAGNOSIS — R5383 Other fatigue: Secondary | ICD-10-CM | POA: Diagnosis not present

## 2023-01-29 DIAGNOSIS — K219 Gastro-esophageal reflux disease without esophagitis: Secondary | ICD-10-CM | POA: Diagnosis not present

## 2023-02-05 ENCOUNTER — Other Ambulatory Visit: Payer: Self-pay | Admitting: Emergency Medicine

## 2023-02-05 DIAGNOSIS — F411 Generalized anxiety disorder: Secondary | ICD-10-CM

## 2023-02-07 DIAGNOSIS — J3089 Other allergic rhinitis: Secondary | ICD-10-CM | POA: Diagnosis not present

## 2023-02-07 DIAGNOSIS — J301 Allergic rhinitis due to pollen: Secondary | ICD-10-CM | POA: Diagnosis not present

## 2023-02-07 DIAGNOSIS — J3081 Allergic rhinitis due to animal (cat) (dog) hair and dander: Secondary | ICD-10-CM | POA: Diagnosis not present

## 2023-02-15 DIAGNOSIS — Z03818 Encounter for observation for suspected exposure to other biological agents ruled out: Secondary | ICD-10-CM | POA: Diagnosis not present

## 2023-02-15 DIAGNOSIS — Z20822 Contact with and (suspected) exposure to covid-19: Secondary | ICD-10-CM | POA: Diagnosis not present

## 2023-02-15 DIAGNOSIS — R051 Acute cough: Secondary | ICD-10-CM | POA: Diagnosis not present

## 2023-02-18 ENCOUNTER — Other Ambulatory Visit: Payer: Self-pay | Admitting: Emergency Medicine

## 2023-02-18 DIAGNOSIS — I10 Essential (primary) hypertension: Secondary | ICD-10-CM

## 2023-03-05 DIAGNOSIS — J3089 Other allergic rhinitis: Secondary | ICD-10-CM | POA: Diagnosis not present

## 2023-03-05 DIAGNOSIS — J301 Allergic rhinitis due to pollen: Secondary | ICD-10-CM | POA: Diagnosis not present

## 2023-03-05 DIAGNOSIS — J3081 Allergic rhinitis due to animal (cat) (dog) hair and dander: Secondary | ICD-10-CM | POA: Diagnosis not present

## 2023-03-07 DIAGNOSIS — M79605 Pain in left leg: Secondary | ICD-10-CM | POA: Diagnosis not present

## 2023-03-07 DIAGNOSIS — B351 Tinea unguium: Secondary | ICD-10-CM | POA: Diagnosis not present

## 2023-03-21 DIAGNOSIS — M25562 Pain in left knee: Secondary | ICD-10-CM | POA: Diagnosis not present

## 2023-03-21 DIAGNOSIS — S61402A Unspecified open wound of left hand, initial encounter: Secondary | ICD-10-CM | POA: Diagnosis not present

## 2023-04-04 DIAGNOSIS — J3089 Other allergic rhinitis: Secondary | ICD-10-CM | POA: Diagnosis not present

## 2023-04-04 DIAGNOSIS — J301 Allergic rhinitis due to pollen: Secondary | ICD-10-CM | POA: Diagnosis not present

## 2023-04-04 DIAGNOSIS — J3081 Allergic rhinitis due to animal (cat) (dog) hair and dander: Secondary | ICD-10-CM | POA: Diagnosis not present

## 2023-04-05 DIAGNOSIS — J3081 Allergic rhinitis due to animal (cat) (dog) hair and dander: Secondary | ICD-10-CM | POA: Diagnosis not present

## 2023-04-05 DIAGNOSIS — J301 Allergic rhinitis due to pollen: Secondary | ICD-10-CM | POA: Diagnosis not present

## 2023-04-05 DIAGNOSIS — J3089 Other allergic rhinitis: Secondary | ICD-10-CM | POA: Diagnosis not present

## 2023-04-11 DIAGNOSIS — J3081 Allergic rhinitis due to animal (cat) (dog) hair and dander: Secondary | ICD-10-CM | POA: Diagnosis not present

## 2023-04-11 DIAGNOSIS — J301 Allergic rhinitis due to pollen: Secondary | ICD-10-CM | POA: Diagnosis not present

## 2023-04-11 DIAGNOSIS — J3089 Other allergic rhinitis: Secondary | ICD-10-CM | POA: Diagnosis not present

## 2023-04-17 ENCOUNTER — Other Ambulatory Visit: Payer: Self-pay | Admitting: Emergency Medicine

## 2023-04-17 DIAGNOSIS — I10 Essential (primary) hypertension: Secondary | ICD-10-CM

## 2023-04-29 ENCOUNTER — Encounter (HOSPITAL_COMMUNITY): Payer: Self-pay

## 2023-04-29 ENCOUNTER — Other Ambulatory Visit: Payer: Self-pay

## 2023-04-29 ENCOUNTER — Emergency Department (HOSPITAL_COMMUNITY): Payer: Medicare PPO

## 2023-04-29 ENCOUNTER — Emergency Department (HOSPITAL_COMMUNITY)
Admission: EM | Admit: 2023-04-29 | Discharge: 2023-04-29 | Disposition: A | Discharge status: Home / Self Care | Payer: Medicare PPO | Attending: Emergency Medicine | Admitting: Emergency Medicine

## 2023-04-29 DIAGNOSIS — R932 Abnormal findings on diagnostic imaging of liver and biliary tract: Secondary | ICD-10-CM | POA: Diagnosis not present

## 2023-04-29 DIAGNOSIS — S39012A Strain of muscle, fascia and tendon of lower back, initial encounter: Secondary | ICD-10-CM | POA: Insufficient documentation

## 2023-04-29 DIAGNOSIS — M545 Low back pain, unspecified: Secondary | ICD-10-CM | POA: Diagnosis not present

## 2023-04-29 DIAGNOSIS — Z79899 Other long term (current) drug therapy: Secondary | ICD-10-CM | POA: Insufficient documentation

## 2023-04-29 DIAGNOSIS — I10 Essential (primary) hypertension: Secondary | ICD-10-CM | POA: Diagnosis not present

## 2023-04-29 DIAGNOSIS — N83291 Other ovarian cyst, right side: Secondary | ICD-10-CM | POA: Diagnosis not present

## 2023-04-29 DIAGNOSIS — S3992XA Unspecified injury of lower back, initial encounter: Secondary | ICD-10-CM | POA: Diagnosis present

## 2023-04-29 DIAGNOSIS — K8020 Calculus of gallbladder without cholecystitis without obstruction: Secondary | ICD-10-CM | POA: Diagnosis not present

## 2023-04-29 DIAGNOSIS — Z7982 Long term (current) use of aspirin: Secondary | ICD-10-CM | POA: Insufficient documentation

## 2023-04-29 DIAGNOSIS — X58XXXA Exposure to other specified factors, initial encounter: Secondary | ICD-10-CM | POA: Diagnosis not present

## 2023-04-29 DIAGNOSIS — R109 Unspecified abdominal pain: Secondary | ICD-10-CM | POA: Insufficient documentation

## 2023-04-29 DIAGNOSIS — N281 Cyst of kidney, acquired: Secondary | ICD-10-CM | POA: Diagnosis not present

## 2023-04-29 LAB — COMPREHENSIVE METABOLIC PANEL
ALT: 22 U/L (ref 0–44)
AST: 20 U/L (ref 15–41)
Albumin: 3.8 g/dL (ref 3.5–5.0)
Alkaline Phosphatase: 91 U/L (ref 38–126)
Anion gap: 9 (ref 5–15)
BUN: 21 mg/dL (ref 8–23)
CO2: 23 mmol/L (ref 22–32)
Calcium: 9.9 mg/dL (ref 8.9–10.3)
Chloride: 105 mmol/L (ref 98–111)
Creatinine, Ser: 0.65 mg/dL (ref 0.44–1.00)
GFR, Estimated: >60 mL/min (ref >60)
Glucose, Bld: 94 mg/dL (ref 70–99)
Potassium: 3.4 mmol/L — ABNORMAL LOW (ref 3.5–5.1)
Sodium: 137 mmol/L (ref 135–145)
Total Bilirubin: 1.1 mg/dL (ref 0.3–1.2)
Total Protein: 7.4 g/dL (ref 6.5–8.1)

## 2023-04-29 LAB — URINALYSIS, ROUTINE W REFLEX MICROSCOPIC
Bilirubin Urine: NEGATIVE
Glucose, UA: NEGATIVE mg/dL
Ketones, ur: NEGATIVE mg/dL
Nitrite: NEGATIVE
Protein, ur: NEGATIVE mg/dL
Specific Gravity, Urine: 1.008 (ref 1.005–1.030)
pH: 8 (ref 5.0–8.0)

## 2023-04-29 LAB — CBC WITH DIFFERENTIAL/PLATELET
Abs Immature Granulocytes: 0.04 10*3/uL (ref 0.00–0.07)
Basophils Absolute: 0.1 10*3/uL (ref 0.0–0.1)
Basophils Relative: 1 %
Eosinophils Absolute: 0.1 10*3/uL (ref 0.0–0.5)
Eosinophils Relative: 1 %
HCT: 40.2 % (ref 36.0–46.0)
Hemoglobin: 14 g/dL (ref 12.0–15.0)
Immature Granulocytes: 0 %
Lymphocytes Relative: 25 %
Lymphs Abs: 2.5 10*3/uL (ref 0.7–4.0)
MCH: 30.6 pg (ref 26.0–34.0)
MCHC: 34.8 g/dL (ref 30.0–36.0)
MCV: 88 fL (ref 80.0–100.0)
Monocytes Absolute: 1 10*3/uL (ref 0.1–1.0)
Monocytes Relative: 10 %
Neutro Abs: 6.4 10*3/uL (ref 1.7–7.7)
Neutrophils Relative %: 63 %
Platelets: 279 10*3/uL (ref 150–400)
RBC: 4.57 MIL/uL (ref 3.87–5.11)
RDW: 13 % (ref 11.5–15.5)
WBC: 10.1 10*3/uL (ref 4.0–10.5)
nRBC: 0 % (ref 0.0–0.2)

## 2023-04-29 MED ORDER — ONDANSETRON HCL 4 MG/2ML IJ SOLN
4.0000 mg | Freq: Once | INTRAMUSCULAR | Status: AC
  Administered 2023-04-29: 4 mg via INTRAVENOUS
  Filled 2023-04-29: qty 2

## 2023-04-29 MED ORDER — OXYCODONE-ACETAMINOPHEN 5-325 MG PO TABS: ORAL_TABLET | ORAL | 0 refills | Status: AC

## 2023-04-29 MED ORDER — IOHEXOL 350 MG/ML SOLN
100.0000 mL | Freq: Once | INTRAVENOUS | Status: AC | PRN
  Administered 2023-04-29: 100 mL via INTRAVENOUS

## 2023-04-29 MED ORDER — OXYCODONE-ACETAMINOPHEN 5-325 MG PO TABS
1.0000 | ORAL_TABLET | Freq: Once | ORAL | Status: AC
  Administered 2023-04-29: 1 via ORAL
  Filled 2023-04-29: qty 1

## 2023-04-29 MED ORDER — HYDROMORPHONE HCL 1 MG/ML IJ SOLN
0.5000 mg | Freq: Once | INTRAMUSCULAR | Status: AC
  Administered 2023-04-29: 0.5 mg via INTRAVENOUS
  Filled 2023-04-29: qty 1

## 2023-04-29 NOTE — ED Provider Notes (Signed)
EMERGENCY DEPARTMENT AT [facility] Provider Note   CSN: [id number] Arrival date & time: 04/29/23  0901     History {Add pertinent medical, surgical, social history, OB history to HPI:1} Chief Complaint  Patient presents with   Back Pain    Ruth Walker is a 77 y.o. female.  Patient has a history of hypertension and irritable bowel.  She has been having left flank pain for a couple weeks.   Back Pain      Home Medications Prior to Admission medications   Medication Sig Start Date End Date Taking? Authorizing Provider  acetaminophen (TYLENOL) 500 MG tablet Take 1,000 mg by mouth every 8 (eight) hours as needed.   Yes [provider]  ALPRAZolam (XANAX) 0.25 MG tablet TAKE 1 TABLET(0.25 MG) BY MOUTH DAILY AS NEEDED FOR ANXIETY Patient taking differently: Take 0.25 mg by mouth daily as needed for anxiety. 02/05/23  Yes SagardiaEilleen Kempf, MD  aspirin 81 MG tablet Take 81 mg by mouth daily.     Yes [provider]  Calcium Carbonate (CALCIUM 600 PO) Take 1 tablet by mouth 2 (two) times daily.   Yes [provider]  hydrochlorothiazide (HYDRODIURIL) 25 MG tablet TAKE 1 TABLET(25 MG) BY MOUTH DAILY 04/17/23  Yes Sagardia, Eilleen Kempf, MD  loratadine (CLARITIN) 10 MG tablet TAKE 1 TABLET(10 MG) BY MOUTH DAILY 04/17/20  Yes Sagardia, Eilleen Kempf, MD  metoprolol tartrate (LOPRESSOR) 25 MG tablet TAKE 1 TABLET(25 MG) BY MOUTH TWICE DAILY 02/18/23  Yes Sagardia, Eilleen Kempf, MD  oxyCODONE-acetaminophen (PERCOCET) 5-325 MG tablet Take 1 every 6 hours for pain is not relieved by Tylenol or Motrin alone 04/29/23  Yes Bethann Berkshire, MD  polyethylene glycol (MIRALAX / GLYCOLAX) packet Take 17 g by mouth at bedtime. Reported on 07/10/2015   Yes [provider]  PRESCRIPTION MEDICATION Inject 1 Dose into the skin once a week. Allergy injection once a week.Marland KitchenMarland [facility]   Yes [provider]  Probiotic  Product (ALIGN) 4 MG CAPS Take 1 capsule by mouth daily. 07/05/11  Yes Mardella Layman, MD  RESTASIS 0.05 % ophthalmic emulsion Place 1 drop into both eyes 2 (two) times daily. 04/02/23  Yes [provider]  AMBULATORY NON FORMULARY MEDICATION Medication Name: Nitroglycerin 0.125% gel, apply rectally tid for 6-8 weeks Patient not taking: Reported on 04/29/2023 02/26/19   Tressia Danas, MD  Linaclotide [facility]) 145 MCG CAPS Take 1 capsule by mouth daily. 09/05/11 10/03/11  Mardella Layman, MD      Allergies    Benadryl [diphenhydramine hcl], Cephalexin, Ciprofloxacin, Mucinex [guaifenesin er], Shellfish allergy, Sudafed [pseudoephedrine hcl], Sulfonamide derivatives, and Aloe    Review of Systems   Review of Systems  Musculoskeletal:  Positive for back pain.    Physical Exam Updated Vital Signs BP 120/80   Pulse 99   Temp 98.2 F (36.8 C) (Oral)   Resp 18   Ht 5\' 6"  (1.676 m)   Wt 66.2 kg   SpO2 99%   BMI 23.56 kg/m  Physical Exam  ED Results / Procedures / Treatments   Labs (all labs ordered are listed, but only abnormal results are displayed) Labs Reviewed  URINALYSIS, ROUTINE W REFLEX MICROSCOPIC - Abnormal; Notable for the following components:      Result Value   Color, Urine STRAW (*)    Hgb urine dipstick SMALL (*)    Leukocytes,Ua TRACE (*)    Bacteria, UA RARE (*)  All other components within normal limits  COMPREHENSIVE METABOLIC PANEL - Abnormal; Notable for the following components:   Potassium 3.4 (*)    All other components within normal limits  CBC WITH DIFFERENTIAL/PLATELET    EKG None  Radiology CT Angio Chest/Abd/Pel for Dissection W and/or Wo Contrast  Result Date: 04/29/2023 CLINICAL DATA:  Acute aortic syndrome, back pain EXAM: CT ANGIOGRAPHY CHEST, ABDOMEN AND PELVIS TECHNIQUE: Non-contrast CT of the chest was initially obtained. Multidetector CT imaging through the chest, abdomen and pelvis was performed using the standard  protocol during bolus administration of intravenous contrast. Multiplanar reconstructed images and MIPs were obtained and reviewed to evaluate the vascular anatomy. RADIATION DOSE REDUCTION: This exam was performed according to the departmental dose-optimization program which includes automated exposure control, adjustment of the mA and/or kV according to patient size and/or use of iterative reconstruction technique. CONTRAST:  OMNIPAQUE IOHEXOL 350 MG/ML SOLN COMPARISON:  None Available. FINDINGS: CTA CHEST FINDINGS Cardiovascular: The thoracic aorta is normal in course and caliber. No intramural hematoma, dissection, or aneurysm. Mild atherosclerotic calcification. No significant coronary artery calcification. Global cardiac size is within normal limits. No pericardial effusion. Central pulmonary arteries are of normal caliber. Mediastinum/Nodes: 2.4 cm right thyroid nodule, not well characterized on this examination. No pathologic thoracic adenopathy. Esophagus unremarkable. Lungs/Pleura: 3 mm sub solid pulmonary nodule noted within the right upper lobe, axial image # 41/7,. Bibasilar atelectasis. Lungs are otherwise clear. No pneumothorax or pleural effusion. Central airways are widely patent. Musculoskeletal: T3 vertebroplasty has been performed. No acute bone abnormality. Osseous structures are age-appropriate. Review of the MIP images confirms the above findings. CTA ABDOMEN AND PELVIS FINDINGS VASCULAR Aorta: Normal caliber aorta without aneurysm, dissection, vasculitis or significant stenosis. Moderate atherosclerotic calcification Celiac: Patent without evidence of aneurysm, dissection, vasculitis or significant stenosis. SMA: Patent without evidence of aneurysm, dissection, vasculitis or significant stenosis. Renals: Both renal arteries are patent without evidence of aneurysm, dissection, vasculitis, fibromuscular dysplasia or significant stenosis. IMA: Patent without evidence of aneurysm,  dissection, vasculitis or significant stenosis. Inflow: Patent without evidence of aneurysm, dissection, vasculitis or significant stenosis. Veins: No obvious venous abnormality within the limitations of this arterial phase study. Review of the MIP images confirms the above findings. NON-VASCULAR Hepatobiliary: Cholelithiasis without pericholecystic inflammatory change liver unremarkable. No intra or extrahepatic biliary ductal dilation. Pancreas: Unremarkable Spleen: 7 mm enhance sing focus within the spleen probably represents a small hemangioma in a patient without a history of malignancy. Spleen otherwise unremarkable. Adrenals/Urinary Tract: Adrenal glands are unremarkable. Kidneys are normal in size and position. Simple cortical cyst noted within the interpolar region of the left kidney for which no follow-up imaging is recommended. The kidneys are otherwise unremarkable. The bladder is unremarkable. Stomach/Bowel: Stomach is within normal limits. Appendix appears normal. No evidence of bowel wall thickening, distention, or inflammatory changes. Lymphatic: No pathologic adenopathy within the abdomen and pelvis. Reproductive: 3.4 cm simple cyst within the right ovary is minimally enlarged since prior examination when measured in similar fashion by approximately 2 mm. This is likely benign given its stability over time. Calcification within the uterine fundus in keeping with probable involuted uterine fibroids. The pelvic organs are otherwise unremarkable. Other: No abdominal wall hernia. Musculoskeletal: Degenerative changes seen within the lumbar spine. No acute bone abnormality. No lytic or blastic bone lesion identified. Review of the MIP images confirms the above findings. IMPRESSION: 1. No acute intrathoracic or intra-abdominal pathology identified. No thoracoabdominal aortic aneurysm or dissection. 2. 2.4 cm right thyroid  nodule. Recommend thyroid US (ref: J Am Coll Radiol. 2015 Feb;12(2): 143-50). 3. 3  mm sub solid pulmonary nodule within the right upper lobe. No follow-up recommended. This recommendation follows the consensus statement: Guidelines for Management of Incidental Pulmonary Nodules Detected on CT Images: From the Fleischner Society 2017; Radiology 2017; 284:228-243. 4. Cholelithiasis. 5. 3.4 cm simple cyst within the right ovary, essentially stable. No follow-up imaging is recommended. Electronically Signed   By: Helyn Numbers M.D.   On: 04/29/2023 19:41    Procedures Procedures  {Document cardiac monitor, telemetry assessment procedure when appropriate:1}  Medications Ordered in ED Medications  HYDROmorphone (DILAUDID) injection 0.5 mg (0.5 mg Intravenous Given 04/29/23 1616)  ondansetron (ZOFRAN) injection 4 mg (4 mg Intravenous Given 04/29/23 1615)  iohexol (OMNIPAQUE) 350 MG/ML injection 100 mL (100 mLs Intravenous Contrast Given 04/29/23 1746)    ED Course/ Medical Decision Making/ A&P   {   Click here for ABCD2, HEART and other calculatorsREFRESH Note before signing :1}                              Medical Decision Making Amount and/or Complexity of Data Reviewed Labs: ordered. Radiology: ordered.  Risk Prescription drug management.  Patient with musculoskeletal pain.  She is given Percocet to take if Tylenol or Motrin does not help and she will follow-up with her PCP.  She needs to have an ultrasound of her thyroid at some point  {Document critical care time when appropriate:1} {Document review of labs and clinical decision tools ie heart score, Chads2Vasc2 etc:1}  {Document your independent review of radiology images, and any outside records:1} {Document your discussion with family members, caretakers, and with consultants:1} {Document social determinants of health affecting pt's care:1} {Document your decision making why or why not admission, treatments were needed:1} Final Clinical Impression(s) / ED Diagnoses Final diagnoses:  Strain of lumbar region,  initial encounter    Rx / DC Orders ED Discharge Orders          Ordered    oxyCODONE-acetaminophen (PERCOCET) 5-325 MG tablet        04/29/23 2029

## 2023-04-29 NOTE — Discharge Instructions (Addendum)
Follow up with your md in 1-2 weeks.  You will need to get an ultrasound of your thyroid gland sometime.   You doctor can arrange that

## 2023-04-29 NOTE — ED Triage Notes (Addendum)
Patient brought in by EMS due to back pain X 2 hours. Per EMS, pt has been having back pain for the past month and seen at urgent care for the same. Pt reports unable to walk earlier, denies any falls. Pt speaking to daughter in triage and states, "I have some burning underneath both of my breasts all the way across."

## 2023-05-03 ENCOUNTER — Telehealth: Payer: Self-pay | Admitting: Emergency Medicine

## 2023-05-03 NOTE — Telephone Encounter (Signed)
Pt was expose around someone with strep throat pt woke up this morning with a sore throat. Pt has an upcoming appt next Tuesday and wanting to know if its okay to wait that far for appt because because she is not able to come in sooner because her back pain. Please advise.

## 2023-05-03 NOTE — Telephone Encounter (Signed)
Okay to wait! Thanks!

## 2023-05-04 ENCOUNTER — Ambulatory Visit (INDEPENDENT_AMBULATORY_CARE_PROVIDER_SITE_OTHER): Payer: Medicare PPO

## 2023-05-04 VITALS — Ht 66.0 in | Wt 140.0 lb

## 2023-05-04 DIAGNOSIS — Z0000 Encounter for general adult medical examination without abnormal findings: Secondary | ICD-10-CM | POA: Diagnosis not present

## 2023-05-04 DIAGNOSIS — Z780 Asymptomatic menopausal state: Secondary | ICD-10-CM

## 2023-05-04 DIAGNOSIS — Z1231 Encounter for screening mammogram for malignant neoplasm of breast: Secondary | ICD-10-CM

## 2023-05-04 DIAGNOSIS — J029 Acute pharyngitis, unspecified: Secondary | ICD-10-CM | POA: Diagnosis not present

## 2023-05-04 DIAGNOSIS — R0981 Nasal congestion: Secondary | ICD-10-CM | POA: Diagnosis not present

## 2023-05-04 DIAGNOSIS — M5442 Lumbago with sciatica, left side: Secondary | ICD-10-CM | POA: Diagnosis not present

## 2023-05-04 DIAGNOSIS — M62830 Muscle spasm of back: Secondary | ICD-10-CM | POA: Diagnosis not present

## 2023-05-04 NOTE — Telephone Encounter (Signed)
Called patient and informed her of provider recommendation 

## 2023-05-04 NOTE — Progress Notes (Signed)
Subjective:   Ruth Walker is a 77 y.o. female who presents for Medicare Annual (Subsequent) preventive examination.  Visit Complete: Virtual I connected with  [name] on 05/04/23 by a audio enabled telemedicine application and verified that I am speaking with the correct person using two identifiers.  Patient Location: Home  Provider Location: Home Office  I discussed the limitations of evaluation and management by telemedicine. The patient expressed understanding and agreed to proceed.  Vital Signs: Because this visit was a virtual/telehealth visit, some criteria may be missing or patient reported. Any vitals not documented were not able to be obtained and vitals that have been documented are patient reported.  Cardiac Risk Factors include: advanced age (>67men, >42 women);hypertension     Objective:    Today's Vitals   05/04/23 1036 05/04/23 1037  Weight: 140 lb (63.5 kg)   Height: 5\' 6"  (1.676 m)   PainSc:  7    Body mass index is 22.6 kg/m.     05/04/2023   10:41 AM 04/29/2023    9:09 AM 11/21/2021    1:13 PM 10/09/2017    1:51 PM 06/04/2017   11:34 AM 06/22/2016    1:35 PM  Advanced Directives  Does Patient Have a Medical Advance Directive? Yes No Yes No No Yes  Type of [place] of [place];Living will   Healthcare Power of [place];Living will  Does patient want to make changes to medical advance directive?      Yes (MAU/Ambulatory/Procedural Areas - Information given)  Copy of Healthcare Power of Attorney in Chart?   No - copy requested   No - copy requested  Would patient like information on creating a medical advance directive?  No - Patient declined  Yes (MAU/Ambulatory/Procedural Areas - Information given) No - Patient declined     Current Medications (verified) Outpatient Encounter Medications as of 05/04/2023  Medication Sig   acetaminophen (TYLENOL) 500 MG tablet Take 1,000 mg by mouth every 8 (eight)  hours as needed.   ALPRAZolam (XANAX) 0.25 MG tablet TAKE 1 TABLET(0.25 MG) BY MOUTH DAILY AS NEEDED FOR ANXIETY (Patient taking differently: Take 0.25 mg by mouth daily as needed for anxiety.)   AMBULATORY NON FORMULARY MEDICATION Medication Name: Nitroglycerin 0.125% gel, apply rectally tid for 6-8 weeks (Patient not taking: Reported on 04/29/2023)   aspirin 81 MG tablet Take 81 mg by mouth daily.     Calcium Carbonate (CALCIUM 600 PO) Take 1 tablet by mouth 2 (two) times daily.   hydrochlorothiazide (HYDRODIURIL) 25 MG tablet TAKE 1 TABLET(25 MG) BY MOUTH DAILY   loratadine (CLARITIN) 10 MG tablet TAKE 1 TABLET(10 MG) BY MOUTH DAILY   metoprolol tartrate (LOPRESSOR) 25 MG tablet TAKE 1 TABLET(25 MG) BY MOUTH TWICE DAILY   oxyCODONE-acetaminophen (PERCOCET) 5-325 MG tablet Take 1 every 6 hours for pain is not relieved by Tylenol or Motrin alone   polyethylene glycol (MIRALAX / GLYCOLAX) packet Take 17 g by mouth at bedtime. Reported on 07/10/2015   PRESCRIPTION MEDICATION Inject 1 Dose into the skin once a week. Allergy injection once a week.[facility]   Probiotic Product (ALIGN) 4 MG CAPS Take 1 capsule by mouth daily.   RESTASIS 0.05 % ophthalmic emulsion Place 1 drop into both eyes 2 (two) times daily.   [DISCONTINUED] Linaclotide (LINZESS) 145 MCG CAPS Take 1 capsule by mouth daily.   No facility-administered encounter medications on file as of 05/04/2023.    Allergies (verified) Benadryl [  diphenhydramine hcl], Cephalexin, Ciprofloxacin, Mucinex [guaifenesin er], Shellfish allergy, Sudafed [pseudoephedrine hcl], Sulfonamide derivatives, and Aloe   History: Past Medical History:  Diagnosis Date   Allergy    Anal fissure    Anxiety states    Asthma    Gets allergy shots once a week    Cholelithiasis    Depression    Diverticulosis    Environmental allergies    Hemorrhoids    Irritable bowel syndrome (IBS)    Osteoporosis    Skin cancer    Leg   Uterine  polyp    Vertebral compression fracture (HCC)    Past Surgical History:  Procedure Laterality Date   BACK SURGERY     CESAREAN SECTION     DILATION AND CURETTAGE OF UTERUS     Endometrial polypectomy     FRACTURE SURGERY     neck (vertebrae)   HEMORRHOID SURGERY     HYSTEROSCOPY     IBS     MOUTH SURGERY     SKIN CANCER EXCISION     Leg    Family History  Problem Relation Age of Onset   Diabetes Mother    Hypertension Mother    Thyroid disease Mother    Hypertension Father    Heart disease Father    Thyroid disease Sister    Colon cancer Neg Hx    Social History   Socioeconomic History   Marital status: Married    Spouse name: Richard   Number of children: 2   Years of education: some college   Highest education level: Not on file  Occupational History   Occupation: Retired  Tobacco Use   Smoking status: Never   Smokeless tobacco: Never  Vaping Use   Vaping status: Never Used  Substance and Sexual Activity   Alcohol use: No    Alcohol/week: 0.0 standard drinks of alcohol   Drug use: No   Sexual activity: Yes    Birth control/protection: Post-menopausal    Comment: number of sex partners in the last 12 months  1  Other Topics Concern   Not on file  Social History Narrative   Patient gets regular exercise. Cares for her grandchildren.   Lives with husband.   Social Determinants of Health   Financial Resource Strain: High Risk (05/04/2023)   Overall Financial Resource Strain (CARDIA)    Difficulty of Paying Living Expenses: Very hard  Food Insecurity: Food Insecurity Present (05/04/2023)   Hunger Vital Sign    Worried About Running Out of Food in the Last Year: Often true    Ran Out of Food in the Last Year: Often true  Transportation Needs: No Transportation Needs (05/04/2023)   PRAPARE - Administrator, Civil Service (Medical): No    Lack of Transportation (Non-Medical): No  Physical Activity: Inactive (05/04/2023)   Exercise Vital Sign     Days of Exercise per Week: 0 days    Minutes of Exercise per Session: 0 min  Stress: Stress Concern Present (05/04/2023)   [employer] of Occupational Health - Occupational Stress Questionnaire    Feeling of Stress : Rather much  Social Connections: Socially Integrated (05/04/2023)   Social Connection and Isolation Panel [NHANES]    Frequency of Communication with Friends and Family: More than three times a week    Frequency of Social Gatherings with Friends and Family: Three times a week    Attends Religious Services: More than 4 times per year    Active Member of  Clubs or Organizations: Yes    Attends Banker Meetings: Never    Marital Status: Married    Tobacco Counseling Counseling given: Not Answered   Clinical Intake:  Pre-visit preparation completed: Yes  Pain Score: 7  Pain Type: Acute pain Pain Location: Back Pain Descriptors / Indicators: Aching, Discomfort     BMI - recorded: 22.6 Nutritional Risks: None  How often do you need to have someone help you when you read instructions, pamphlets, or other written materials from your doctor or pharmacy?: 1 - Never  Interpreter Needed?: No  Information entered by :: Nickcole Bralley, RMA   Activities of Daily Living    05/04/2023   10:39 AM  In your present state of health, do you have any difficulty performing the following activities:  Hearing? 0  Vision? 0  Difficulty concentrating or making decisions? 0  Walking or climbing stairs? 0  Dressing or bathing? 0  Doing errands, shopping? 0  Preparing Food and eating ? N  Using the Toilet? N  In the past six months, have you accidently leaked urine? Y  Do you have problems with loss of bowel control? N  Managing your Medications? N  Managing your Finances? N  Housekeeping or managing your Housekeeping? N    Patient Care Team: Georgina Quint, MD as PCP - General (Internal Medicine) [facility]  Indicate any  recent Medical Services you may have received from other than [facility] providers in the past year (date may be approximate).     Assessment:   This is a routine wellness examination for Ruth Walker.  Hearing/Vision screen Hearing Screening - Comments:: Denies hearing difficulties   Vision Screening - Comments:: Wears eyeglasses    Goals Addressed   None   Depression Screen    05/04/2023   10:44 AM 04/19/2022    1:10 PM 11/21/2021    1:14 PM 11/21/2021    1:11 PM 07/20/2021    2:39 PM 01/08/2020   11:32 AM 09/16/2019    3:16 PM  PHQ 2/9 Scores  PHQ - 2 Score 1 1 0 0 0 0 0  PHQ- 9 Score 2 2         Fall Risk    05/04/2023   10:42 AM 04/19/2022    1:09 PM 11/21/2021    1:13 PM 07/20/2021    2:39 PM 01/08/2020   11:32 AM  Fall Risk   Falls in the past year? 0 0 1 0 0  Number falls in past yr: 0 0 0 0   Injury with Fall? 0 0 0 0   Risk for fall due to : No Fall Risks No Fall Risks     Follow up Falls prevention discussed;Falls evaluation completed  Falls evaluation completed  Falls evaluation completed    MEDICARE RISK AT HOME: Medicare Risk at Home Any stairs in or around the home?: Yes If so, are there any without handrails?: Yes Home free of loose throw rugs in walkways, pet beds, electrical cords, etc?: Yes Adequate lighting in your home to reduce risk of falls?: Yes Life alert?: No Use of a cane, walker or w/c?: Yes Grab bars in the bathroom?: Yes Shower chair or bench in shower?: No Elevated toilet seat or a handicapped toilet?: No  TIMED UP AND GO:  Was the test performed?  No    Cognitive Function:        05/04/2023   10:42 AM 10/09/2017    2:02 PM  6CIT  Screen  What Year? 0 points 0 points  What month? 0 points 0 points  What time? 0 points 0 points  Count back from 20 0 points 0 points  Months in reverse 0 points 0 points  Repeat phrase 0 points 0 points  Total Score 0 points 0 points    Immunizations Immunization History  Administered Date(s)  Administered   Fluad Quad(high Dose 65+) 03/11/2019   Influenza Split 05/04/2012, 05/29/2018   Influenza,inj,Quad PF,6+ Mos 03/29/2013, 04/19/2014, 05/15/2017   Influenza-Unspecified 04/26/2015, 05/26/2016, 05/09/2021   Pneumococcal Conjugate-13 06/22/2016   Tdap 06/18/2013, 07/20/2021   Zoster, Unspecified 12/20/1996    TDAP status: Up to date  Flu Vaccine status: Due, Education has been provided regarding the importance of this vaccine. Advised may receive this vaccine at local pharmacy or Health Dept. Aware to provide a copy of the vaccination record if obtained from local pharmacy or Health Dept. Verbalized acceptance and understanding.  Pneumococcal vaccine status: Declined,  Education has been provided regarding the importance of this vaccine but patient still declined. Advised may receive this vaccine at local pharmacy or Health Dept. Aware to provide a copy of the vaccination record if obtained from local pharmacy or Health Dept. Verbalized acceptance and understanding.   Covid-19 vaccine status: Information provided on how to obtain vaccines.   Qualifies for Shingles Vaccine? Yes   Zostavax completed Yes   Shingrix Completed?: No.    Education has been provided regarding the importance of this vaccine. Patient has been advised to call insurance company to determine out of pocket expense if they have not yet received this vaccine. Advised may also receive vaccine at local pharmacy or Health Dept. Verbalized acceptance and understanding.  Screening Tests Health Maintenance  Topic Date Due   Zoster Vaccines- Shingrix (1 of 2) 12/20/1996   Pneumonia Vaccine 68+ Years old (2 of 2 - PPSV23 or PCV20) 06/22/2017   MAMMOGRAM  05/18/2021   INFLUENZA VACCINE  02/08/2023   COVID-19 Vaccine (1 - 2023-24 season) Never done   Medicare Annual Wellness (AWV)  05/03/2024   DTaP/Tdap/Td (3 - Td or Tdap) 07/21/2031   DEXA SCAN  Completed   Hepatitis C Screening  Completed   HPV VACCINES  Aged  Out   Colonoscopy  Discontinued    Health Maintenance  Health Maintenance Due  Topic Date Due   Zoster Vaccines- Shingrix (1 of 2) 12/20/1996   Pneumonia Vaccine 17+ Years old (2 of 2 - PPSV23 or PCV20) 06/22/2017   MAMMOGRAM  05/18/2021   INFLUENZA VACCINE  02/08/2023   COVID-19 Vaccine (1 - 2023-24 season) Never done    Colorectal cancer screening: No longer required.   Mammogram status: Completed 05/18/2020. Repeat every year  Bone Density status: Completed 01/09/2012. Results reflect: Bone density results: OSTEOPOROSIS. Repeat every 2 years.  Lung Cancer Screening: (Low Dose CT Chest recommended if Age 67-80 years, 20 pack-year currently smoking OR have quit w/in 15years.) does not qualify.   Lung Cancer Screening Referral: N/A  Additional Screening:  Hepatitis C Screening: does qualify; Completed 04/28/2013  Vision Screening: Recommended annual ophthalmology exams for early detection of glaucoma and other disorders of the eye. Is the patient up to date with their annual eye exam?  No  Who is the provider or what is the name of the office in which the patient attends annual eye exams? Dr. Dione Booze If pt is not established with a provider, would they like to be referred to a provider to establish care? No .  Dental Screening: Recommended annual dental exams for proper oral hygiene   Community Resource Referral / Chronic Care Management: CRR required this visit?  No   CCM required this visit?  No     Plan:     I have personally reviewed and noted the following in the patient's chart:   Medical and social history Use of alcohol, tobacco or illicit drugs  Current medications and supplements including opioid prescriptions. Patient is currently taking opioid prescriptions. Information provided to patient regarding non-opioid alternatives. Patient advised to discuss non-opioid treatment plan with their provider. Functional ability and status Nutritional  status Physical activity Advanced directives List of other physicians Hospitalizations, surgeries, and ER visits in previous 12 months Vitals Screenings to include cognitive, depression, and falls Referrals and appointments  In addition, I have reviewed and discussed with patient certain preventive protocols, quality metrics, and best practice recommendations. A written personalized care plan for preventive services as well as general preventive health recommendations were provided to patient.     Savannah Erbe L Jancarlos Thrun, CMA   05/04/2023   After Visit Summary: (Mail) Due to this being a telephonic visit, the after visit summary with patients personalized plan was offered to patient via mail   Nurse Notes: Patient is due for Flu and Covid vaccine.  She stated that she will get these "this year", she has declined shingles vaccine and the pneumonia vaccine.  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  She is due for a mammogram and DEXA screenings (orders placed). Patient explains that she is not feeling well today, which she hurried through visit today with me.  I got as much information as she was willing to give me.  She will discuss whatever she missed today during her up coming visit with Dr. Alvy Bimler.

## 2023-05-04 NOTE — Patient Instructions (Addendum)
Ruth Walker , Thank you for taking time to come for your Medicare Wellness Visit. I appreciate your ongoing commitment to your health goals. Please review the following plan we discussed and let me know if I can assist you in the future.   Referrals/Orders/Follow-Ups/Clinician Recommendations: You are due fro a Flu and Covid vaccine.  You are also due for a pneumonia vaccine.  Remember to schedule a mammogram soon and a bone density. You have an order for:   [x]   3D Mammogram  [x]   Bone Density     Please call for appointment:   [facility] [address] [place], [place] [address] [phone]     Make sure to wear two-piece clothing.  No lotions, powders, or deodorants the day of the appointment. Make sure to bring picture ID and insurance card.  Bring list of medications you are currently taking including any supplements.   Schedule your [facility] screening mammogram through MyChart!   Log into your MyChart account.  Go to 'Visit' (or 'Appointments' if on mobile App) --> Schedule an Appointment  Under 'Select a Reason for Visit' choose the Mammogram Screening option.  Complete the pre-visit questions and select the time and place that best fits your schedule.    This is a list of the screening recommended for you and due dates:  Health Maintenance  Topic Date Due   Zoster (Shingles) Vaccine (1 of 2) 12/20/1996   Pneumonia Vaccine (2 of 2 - PPSV23 or PCV20) 06/22/2017   Mammogram  05/18/2021   Flu Shot  02/08/2023   COVID-19 Vaccine (1 - 2023-24 season) Never done   Medicare Annual Wellness Visit  05/03/2024   DTaP/Tdap/Td vaccine (3 - Td or Tdap) 07/21/2031   DEXA scan (bone density measurement)  Completed   Hepatitis C Screening  Completed   HPV Vaccine  Aged Out   Colon Cancer Screening  Discontinued    Advanced directives: (Copy Requested) Please bring a copy of your health care power of attorney and living will to the office to be  added to your chart at your convenience.  Next Medicare Annual Wellness Visit scheduled for next year: Yes

## 2023-05-08 ENCOUNTER — Encounter: Payer: Self-pay | Admitting: Emergency Medicine

## 2023-05-08 ENCOUNTER — Ambulatory Visit: Payer: Medicare PPO | Admitting: Emergency Medicine

## 2023-05-08 VITALS — BP 156/98 | HR 80 | Temp 98.2°F | Ht 66.0 in | Wt 144.5 lb

## 2023-05-08 DIAGNOSIS — E041 Nontoxic single thyroid nodule: Secondary | ICD-10-CM

## 2023-05-08 DIAGNOSIS — R300 Dysuria: Secondary | ICD-10-CM | POA: Insufficient documentation

## 2023-05-08 DIAGNOSIS — R399 Unspecified symptoms and signs involving the genitourinary system: Secondary | ICD-10-CM | POA: Diagnosis not present

## 2023-05-08 DIAGNOSIS — N390 Urinary tract infection, site not specified: Secondary | ICD-10-CM | POA: Diagnosis not present

## 2023-05-08 DIAGNOSIS — M549 Dorsalgia, unspecified: Secondary | ICD-10-CM | POA: Diagnosis not present

## 2023-05-08 DIAGNOSIS — Z23 Encounter for immunization: Secondary | ICD-10-CM

## 2023-05-08 LAB — POCT URINALYSIS DIPSTICK
Bilirubin, UA: NEGATIVE
Blood, UA: POSITIVE
Glucose, UA: NEGATIVE
Ketones, UA: NEGATIVE
Nitrite, UA: NEGATIVE
Protein, UA: POSITIVE — AB
Spec Grav, UA: 1.02 (ref 1.010–1.025)
Urobilinogen, UA: 0.2 U/dL
pH, UA: 7 (ref 5.0–8.0)

## 2023-05-08 MED ORDER — OXYBUTYNIN CHLORIDE 5 MG PO TABS: 5.0000 mg | ORAL_TABLET | Freq: Three times a day (TID) | ORAL | 2 refills | Status: AC | PRN

## 2023-05-08 MED ORDER — NITROFURANTOIN MONOHYD MACRO 100 MG PO CAPS: 100.0000 mg | ORAL_CAPSULE | Freq: Two times a day (BID) | ORAL | 0 refills | Status: AC

## 2023-05-08 NOTE — Assessment & Plan Note (Signed)
Incidental finding on recent CT scan of chest and abdomen Recommend thyroid ultrasound

## 2023-05-08 NOTE — Assessment & Plan Note (Signed)
Mechanical in nature. Pain management discussed. Recommend Tylenol as needed for pain.

## 2023-05-08 NOTE — Assessment & Plan Note (Signed)
Of recent onset.  Positive urinalysis. Urine sent for culture. Multiple allergies to antibiotics. Recommend Macrobid 100 mg twice a day for 7 days

## 2023-05-08 NOTE — Patient Instructions (Signed)
Urinary Tract Infection, Adult A urinary tract infection (UTI) is an infection of any part of the urinary tract. The urinary tract includes: The kidneys. The ureters. The bladder. The urethra. These organs make, store, and get rid of pee (urine) in the body. What are the causes? This infection is caused by germs (bacteria) in your genital area. These germs grow and cause swelling (inflammation) of your urinary tract. What increases the risk? The following factors may make you more likely to develop this condition: Using a small, thin tube (catheter) to drain pee. Not being able to control when you pee or poop (incontinence). Being female. If you are female, these things can increase the risk: Using these methods to prevent pregnancy: A medicine that kills sperm (spermicide). A device that blocks sperm (diaphragm). Having low levels of a female hormone (estrogen). Being pregnant. You are more likely to develop this condition if: You have genes that add to your risk. You are sexually active. You take antibiotic medicines. You have trouble peeing because of: A prostate that is bigger than normal, if you are female. A blockage in the part of your body that drains pee from the bladder. A kidney stone. A nerve condition that affects your bladder. Not getting enough to drink. Not peeing often enough. You have other conditions, such as: Diabetes. A weak disease-fighting system (immune system). Sickle cell disease. Gout. Injury of the spine. What are the signs or symptoms? Symptoms of this condition include: Needing to pee right away. Peeing small amounts often. Pain or burning when peeing. Blood in the pee. Pee that smells bad or not like normal. Trouble peeing. Pee that is cloudy. Fluid coming from the vagina, if you are female. Pain in the belly or lower back. Other symptoms include: Vomiting. Not feeling hungry. Feeling mixed up (confused). This may be the first symptom in  older adults. Being tired and grouchy (irritable). A fever. Watery poop (diarrhea). How is this treated? Taking antibiotic medicine. Taking other medicines. Drinking enough water. In some cases, you may need to see a specialist. Follow these instructions at home:  Medicines Take over-the-counter and prescription medicines only as told by your doctor. If you were prescribed an antibiotic medicine, take it as told by your doctor. Do not stop taking it even if you start to feel better. General instructions Make sure you: Pee until your bladder is empty. Do not hold pee for a long time. Empty your bladder after sex. Wipe from front to back after peeing or pooping if you are a female. Use each tissue one time when you wipe. Drink enough fluid to keep your pee pale yellow. Keep all follow-up visits. Contact a doctor if: You do not get better after 1-2 days. Your symptoms go away and then come back. Get help right away if: You have very bad back pain. You have very bad pain in your lower belly. You have a fever. You have chills. You feeling like you will vomit or you vomit. Summary A urinary tract infection (UTI) is an infection of any part of the urinary tract. This condition is caused by germs in your genital area. There are many risk factors for a UTI. Treatment includes antibiotic medicines. Drink enough fluid to keep your pee pale yellow. This information is not intended to replace advice given to you by your health care provider. Make sure you discuss any questions you have with your health care provider. Document Revised: 02/01/2020 Document Reviewed: 02/06/2020 Elsevier Patient Education    2024 [employer]  

## 2023-05-08 NOTE — Progress Notes (Unsigned)
Ruth Walker 77 y.o.   Chief Complaint  Patient presents with   Back Pain    Patient is here for a f/u from her back pain, she states he back pain is getting a little better,but is still there at times    urination issues    Patient states she is having some issues with burning with urination. No other symptoms     HISTORY OF PRESENT ILLNESS: This is a 77 y.o. female complaining of burning with urination that started today Also complaining of intermittent episodes of bladder incontinence with urgency Also complaining of chronic low back pain Was recently in the emergency room.  States CT scan showed thyroid nodule.  Needs follow-up.  IMPRESSION: 1. No acute intrathoracic or intra-abdominal pathology identified. No thoracoabdominal aortic aneurysm or dissection. 2. 2.4 cm right thyroid nodule. Recommend thyroid US (ref: J Am Coll Radiol. 2015 Feb;12(2): 143-50). 3. 3 mm sub solid pulmonary nodule within the right upper lobe. No follow-up recommended. This recommendation follows the consensus statement: Guidelines for Management of Incidental Pulmonary Nodules Detected on CT Images: From the Fleischner Society 2017; Radiology 2017; 284:228-243. 4. Cholelithiasis. 5. 3.4 cm simple cyst within the right ovary, essentially stable. No follow-up imaging is recommended.     Electronically Signed   By: Helyn Numbers M.D.   On: 04/29/2023 19:41  HPI   Prior to Admission medications   Medication Sig Start Date End Date Taking? Authorizing Provider  acetaminophen (TYLENOL) 500 MG tablet Take 1,000 mg by mouth every 8 (eight) hours as needed.   Yes [provider]  ALPRAZolam (XANAX) 0.25 MG tablet TAKE 1 TABLET(0.25 MG) BY MOUTH DAILY AS NEEDED FOR ANXIETY Patient taking differently: Take 0.25 mg by mouth daily as needed for anxiety. 02/05/23  Yes SagardiaEilleen Kempf, MD  aspirin 81 MG tablet Take 81 mg by mouth daily.     Yes [provider]  Calcium  Carbonate (CALCIUM 600 PO) Take 1 tablet by mouth 2 (two) times daily.   Yes [provider]  hydrochlorothiazide (HYDRODIURIL) 25 MG tablet TAKE 1 TABLET(25 MG) BY MOUTH DAILY 04/17/23  Yes Yamilett Anastos, Eilleen Kempf, MD  loratadine (CLARITIN) 10 MG tablet TAKE 1 TABLET(10 MG) BY MOUTH DAILY 04/17/20  Yes Traeger Sultana, Eilleen Kempf, MD  metoprolol tartrate (LOPRESSOR) 25 MG tablet TAKE 1 TABLET(25 MG) BY MOUTH TWICE DAILY 02/18/23  Yes Vencent Hauschild, Eilleen Kempf, MD  polyethylene glycol (MIRALAX / GLYCOLAX) packet Take 17 g by mouth at bedtime. Reported on 07/10/2015   Yes [provider]  PRESCRIPTION MEDICATION Inject 1 Dose into the skin once a week. Allergy injection once a week.[facility]   Yes [provider]  Probiotic Product (ALIGN) 4 MG CAPS Take 1 capsule by mouth daily. 07/05/11  Yes Mardella Layman, MD  RESTASIS 0.05 % ophthalmic emulsion Place 1 drop into both eyes 2 (two) times daily. 04/02/23  Yes [provider]  AMBULATORY NON FORMULARY MEDICATION Medication Name: Nitroglycerin 0.125% gel, apply rectally tid for 6-8 weeks Patient not taking: Reported on 04/29/2023 02/26/19   Tressia Danas, MD  oxyCODONE-acetaminophen (PERCOCET) 5-325 MG tablet Take 1 every 6 hours for pain is not relieved by Tylenol or Motrin alone Patient not taking: Reported on 05/08/2023 04/29/23   Bethann Berkshire, MD  tiZANidine (ZANAFLEX) 4 MG tablet 1 tablet Orally Three times a day as needed for muscle spasm for 10 days Patient not taking: Reported on 05/08/2023 05/04/23   [provider]  Linaclotide (  LINZESS) 145 MCG CAPS Take 1 capsule by mouth daily. 09/05/11 10/03/11  Mardella Layman, MD    Allergies  Allergen Reactions   Benadryl [Diphenhydramine Hcl] Other (See Comments)    Makes her feel "hyper"   Cephalexin     REACTION: coarse rash   Ciprofloxacin    Mucinex [Guaifenesin Er] Other (See Comments)    Makes her feel worse   Shellfish  Allergy Hives   Sudafed [Pseudoephedrine Hcl] Hives and Other (See Comments)    Itching and hives asthma   Sulfonamide Derivatives Hives   Aloe Rash    Patient Active Problem List   Diagnosis Date Noted   Uterine polyp    Vertebral compression fracture (HCC)    IBS (irritable bowel syndrome) 09/05/2011   Constipation, slow transit 09/05/2011   Anxiety disorder 09/05/2011   Diverticulosis 11/04/2010   Gallstones 11/04/2010   ANAL FISSURE 05/26/2008   OBSESSIVE-COMPULSIVE PERSONALITY DISORDER 02/28/2008   Essential hypertension 02/28/2008   CYSTITIS, CHRONIC 02/28/2008    Past Medical History:  Diagnosis Date   Allergy    Anal fissure    Anxiety states    Asthma    Gets allergy shots once a week    Cholelithiasis    Depression    Diverticulosis    Environmental allergies    Hemorrhoids    Irritable bowel syndrome (IBS)    Osteoporosis    Skin cancer    Leg   Uterine polyp    Vertebral compression fracture (HCC)     Past Surgical History:  Procedure Laterality Date   BACK SURGERY     CESAREAN SECTION     DILATION AND CURETTAGE OF UTERUS     Endometrial polypectomy     FRACTURE SURGERY     neck (vertebrae)   HEMORRHOID SURGERY     HYSTEROSCOPY     IBS     MOUTH SURGERY     SKIN CANCER EXCISION     Leg     Social History   Socioeconomic History   Marital status: Married    Spouse name: Richard   Number of children: 2   Years of education: some college   Highest education level: Not on file  Occupational History   Occupation: Retired  Tobacco Use   Smoking status: Never   Smokeless tobacco: Never  Vaping Use   Vaping status: Never Used  Substance and Sexual Activity   Alcohol use: No    Alcohol/week: 0.0 standard drinks of alcohol   Drug use: No   Sexual activity: Yes    Birth control/protection: Post-menopausal    Comment: number of sex partners in the last 12 months  1  Other Topics Concern   Not on file  Social History Narrative    Patient gets regular exercise. Cares for her grandchildren.   Lives with husband.   Social Determinants of Health   Financial Resource Strain: High Risk (05/04/2023)   Overall Financial Resource Strain (CARDIA)    Difficulty of Paying Living Expenses: Very hard  Food Insecurity: Food Insecurity Present (05/04/2023)   Hunger Vital Sign    Worried About Running Out of Food in the Last Year: Often true    Ran Out of Food in the Last Year: Often true  Transportation Needs: No Transportation Needs (05/04/2023)   PRAPARE - Administrator, Civil Service (Medical): No    Lack of Transportation (Non-Medical): No  Physical Activity: Inactive (05/04/2023)   Exercise Vital Sign  Days of Exercise per Week: 0 days    Minutes of Exercise per Session: 0 min  Stress: Stress Concern Present (05/04/2023)   [employer] of Occupational Health - Occupational Stress Questionnaire    Feeling of Stress : Rather much  Social Connections: Socially Integrated (05/04/2023)   Social Connection and Isolation Panel [NHANES]    Frequency of Communication with Friends and Family: More than three times a week    Frequency of Social Gatherings with Friends and Family: Three times a week    Attends Religious Services: More than 4 times per year    Active Member of Clubs or Organizations: Yes    Attends Banker Meetings: Never    Marital Status: Married  Catering manager Violence: Not At Risk (05/04/2023)   Humiliation, Afraid, Rape, and Kick questionnaire    Fear of Current or Ex-Partner: No    Emotionally Abused: No    Physically Abused: No    Sexually Abused: No    Family History  Problem Relation Age of Onset   Diabetes Mother    Hypertension Mother    Thyroid disease Mother    Hypertension Father    Heart disease Father    Thyroid disease Sister    Colon cancer Neg Hx      Review of Systems  Constitutional: Negative.  Negative for chills and fever.  HENT: Negative.   Negative for congestion and sore throat.   Respiratory: Negative.  Negative for cough and shortness of breath.   Cardiovascular: Negative.  Negative for chest pain and palpitations.  Gastrointestinal:  Negative for abdominal pain, diarrhea, nausea and vomiting.  Musculoskeletal:  Positive for back pain.  Skin: Negative.  Negative for rash.  Neurological: Negative.  Negative for dizziness and headaches.  All other systems reviewed and are negative.   Vitals:   05/08/23 1511  BP: (!) 156/98  Pulse: 80  Temp: 98.2 F (36.8 C)  SpO2: 97%    Physical Exam Vitals reviewed.  Constitutional:      Appearance: Normal appearance.  HENT:     Head: Normocephalic.  Eyes:     Extraocular Movements: Extraocular movements intact.  Cardiovascular:     Rate and Rhythm: Normal rate.  Pulmonary:     Effort: Pulmonary effort is normal.  Abdominal:     Palpations: Abdomen is soft.     Tenderness: There is no abdominal tenderness. There is no right CVA tenderness or left CVA tenderness.  Musculoskeletal:     Lumbar back: Tenderness present. No bony tenderness. Decreased range of motion.  Skin:    General: Skin is warm and dry.  Neurological:     Mental Status: She is alert and oriented to person, place, and time.  Psychiatric:        Mood and Affect: Mood normal.        Behavior: Behavior normal.    Results for orders placed or performed in visit on 05/08/23 (from the past 24 hour(s))  POCT Urinalysis Dipstick     Status: Abnormal   Collection Time: 05/08/23  3:41 PM  Result Value Ref Range   Color, UA amber    Clarity, UA cloudy    Glucose, UA Negative Negative   Bilirubin, UA negative    Ketones, UA negative    Spec Grav, UA 1.020 1.010 - 1.025   Blood, UA positive    pH, UA 7.0 5.0 - 8.0   Protein, UA Positive (A) Negative   Urobilinogen, UA 0.2 0.2 or  1.0 E.U./dL   Nitrite, UA negative    Leukocytes, UA Small (1+) (A) Negative   Appearance     Odor       ASSESSMENT &  PLAN: A total of 33 minutes was spent with the patient and counseling/coordination of care regarding preparing for this visit, review of most recent office visit notes, review of most recent imaging from emergency department visit, review of most recent blood work results, review of today's urinalysis, diagnosis of UTI and need for antibiotics, pain management, prognosis, documentation and need for follow-up.  Problem List Items Addressed This Visit       Endocrine   Thyroid nodule    Incidental finding on recent CT scan of chest and abdomen Recommend thyroid ultrasound      Relevant Orders   US THYROID     Genitourinary   Acute UTI - Primary    Of recent onset.  Positive urinalysis. Urine sent for culture. Multiple allergies to antibiotics. Recommend Macrobid 100 mg twice a day for 7 days      Relevant Medications   nitrofurantoin, macrocrystal-monohydrate, (MACROBID) 100 MG capsule     Other   Musculoskeletal back pain    Mechanical in nature. Pain management discussed. Recommend Tylenol as needed for pain.      Relevant Medications   tiZANidine (ZANAFLEX) 4 MG tablet   Burning with urination    Secondary to urinary tract infection Recommend to start Macrobid 100 mg twice a day for 7 days      Relevant Orders   Urine Culture   POCT Urinalysis Dipstick (Completed)   Lower urinary tract symptoms    Including episodes of urinary incontinence Recommend trial of Ditropan 5 mg twice a day as needed      Relevant Medications   oxybutynin (DITROPAN) 5 MG tablet   Other Visit Diagnoses     Need for vaccination       Relevant Orders   Flu Vaccine Trivalent High Dose (Fluad)      Patient Instructions  Urinary Tract Infection, Adult A urinary tract infection (UTI) is an infection of any part of the urinary tract. The urinary tract includes: The kidneys. The ureters. The bladder. The urethra. These organs make, store, and get rid of pee (urine) in the  body. What are the causes? This infection is caused by germs (bacteria) in your genital area. These germs grow and cause swelling (inflammation) of your urinary tract. What increases the risk? The following factors may make you more likely to develop this condition: Using a small, thin tube (catheter) to drain pee. Not being able to control when you pee or poop (incontinence). Being female. If you are female, these things can increase the risk: Using these methods to prevent pregnancy: A medicine that kills sperm (spermicide). A device that blocks sperm (diaphragm). Having low levels of a female hormone (estrogen). Being pregnant. You are more likely to develop this condition if: You have genes that add to your risk. You are sexually active. You take antibiotic medicines. You have trouble peeing because of: A prostate that is bigger than normal, if you are female. A blockage in the part of your body that drains pee from the bladder. A kidney stone. A nerve condition that affects your bladder. Not getting enough to drink. Not peeing often enough. You have other conditions, such as: Diabetes. A weak disease-fighting system (immune system). Sickle cell disease. Gout. Injury of the spine. What are the signs or symptoms? Symptoms of  this condition include: Needing to pee right away. Peeing small amounts often. Pain or burning when peeing. Blood in the pee. Pee that smells bad or not like normal. Trouble peeing. Pee that is cloudy. Fluid coming from the vagina, if you are female. Pain in the belly or lower back. Other symptoms include: Vomiting. Not feeling hungry. Feeling mixed up (confused). This may be the first symptom in older adults. Being tired and grouchy (irritable). A fever. Watery poop (diarrhea). How is this treated? Taking antibiotic medicine. Taking other medicines. Drinking enough water. In some cases, you may need to see a specialist. Follow these  instructions at home:  Medicines Take over-the-counter and prescription medicines only as told by your doctor. If you were prescribed an antibiotic medicine, take it as told by your doctor. Do not stop taking it even if you start to feel better. General instructions Make sure you: Pee until your bladder is empty. Do not hold pee for a long time. Empty your bladder after sex. Wipe from front to back after peeing or pooping if you are a female. Use each tissue one time when you wipe. Drink enough fluid to keep your pee pale yellow. Keep all follow-up visits. Contact a doctor if: You do not get better after 1-2 days. Your symptoms go away and then come back. Get help right away if: You have very bad back pain. You have very bad pain in your lower belly. You have a fever. You have chills. You feeling like you will vomit or you vomit. Summary A urinary tract infection (UTI) is an infection of any part of the urinary tract. This condition is caused by germs in your genital area. There are many risk factors for a UTI. Treatment includes antibiotic medicines. Drink enough fluid to keep your pee pale yellow. This information is not intended to replace advice given to you by your health care provider. Make sure you discuss any questions you have with your health care provider. Document Revised: 02/01/2020 Document Reviewed: 02/06/2020 Elsevier Patient Education  2024 Elsevier Inc.      Edwina Barth, MD [facility]

## 2023-05-08 NOTE — Assessment & Plan Note (Signed)
Secondary to urinary tract infection Recommend to start Macrobid 100 mg twice a day for 7 days

## 2023-05-08 NOTE — Assessment & Plan Note (Signed)
Including episodes of urinary incontinence Recommend trial of Ditropan 5 mg twice a day as needed

## 2023-05-09 DIAGNOSIS — J301 Allergic rhinitis due to pollen: Secondary | ICD-10-CM | POA: Diagnosis not present

## 2023-05-09 DIAGNOSIS — J3089 Other allergic rhinitis: Secondary | ICD-10-CM | POA: Diagnosis not present

## 2023-05-09 DIAGNOSIS — J3081 Allergic rhinitis due to animal (cat) (dog) hair and dander: Secondary | ICD-10-CM | POA: Diagnosis not present

## 2023-05-14 ENCOUNTER — Telehealth: Payer: Self-pay | Admitting: Emergency Medicine

## 2023-05-14 NOTE — Telephone Encounter (Signed)
Patient is feeling better but she had to stop the macrobid because it was making patient sick.  Please advise if she should have anything else.

## 2023-05-14 NOTE — Telephone Encounter (Signed)
Called patient and informed her of provider recommendation. Patient states when she was on the medication she felt sick on her stomach. She states she is not going to take it and she states that she feels better. She states if she feels worse she will call the office back.

## 2023-05-14 NOTE — Telephone Encounter (Signed)
Has multiple allergies to multiple antibiotics.  Culture was never run.  Needs to stay on Macrobid.  Thanks.

## 2023-05-14 NOTE — Telephone Encounter (Signed)
Okay. Thank you.

## 2023-05-16 DIAGNOSIS — L308 Other specified dermatitis: Secondary | ICD-10-CM | POA: Diagnosis not present

## 2023-05-16 DIAGNOSIS — L089 Local infection of the skin and subcutaneous tissue, unspecified: Secondary | ICD-10-CM | POA: Diagnosis not present

## 2023-05-23 ENCOUNTER — Telehealth: Payer: Self-pay | Admitting: Emergency Medicine

## 2023-05-23 NOTE — Telephone Encounter (Signed)
Patient was seen by Dr. Alvy Bimler on 05/08/23. Medicaid was in the system when she was checking in. She said she had previously applied for Medicaid in November of 2023, but hadn't heard anything back. The phone number attached to the Medicaid in her chart was provided to her and when she called it, they immediately asked for her credit card information. She asked to speak with someone with the government and they hung up on her. Patient called to make our office aware of this issue. I looked up the phone number on [employer] and nothing came up for it at all. Please advise.

## 2023-05-25 ENCOUNTER — Ambulatory Visit
Admission: RE | Admit: 2023-05-25 | Discharge: 2023-05-25 | Disposition: A | Discharge status: Home / Self Care | Payer: Medicare PPO | Source: Ambulatory Visit | Attending: Emergency Medicine

## 2023-05-25 DIAGNOSIS — E041 Nontoxic single thyroid nodule: Secondary | ICD-10-CM

## 2023-05-29 DIAGNOSIS — M25562 Pain in left knee: Secondary | ICD-10-CM | POA: Diagnosis not present

## 2023-05-29 DIAGNOSIS — Z9989 Dependence on other enabling machines and devices: Secondary | ICD-10-CM | POA: Diagnosis not present

## 2023-06-04 ENCOUNTER — Other Ambulatory Visit: Payer: Self-pay | Admitting: Emergency Medicine

## 2023-06-04 DIAGNOSIS — E041 Nontoxic single thyroid nodule: Secondary | ICD-10-CM

## 2023-06-05 ENCOUNTER — Telehealth: Payer: Self-pay | Admitting: Emergency Medicine

## 2023-06-05 NOTE — Telephone Encounter (Signed)
Pt called back having a question about the the endocrinology. Please advise.

## 2023-06-06 NOTE — Telephone Encounter (Signed)
Spoke with patient and she just wanted to make sure that that were going to call her to make an appointment. Reassured patient to not to worried and more than likely the call will be after the holidays.

## 2023-06-18 DIAGNOSIS — L853 Xerosis cutis: Secondary | ICD-10-CM | POA: Diagnosis not present

## 2023-06-28 DIAGNOSIS — L989 Disorder of the skin and subcutaneous tissue, unspecified: Secondary | ICD-10-CM | POA: Diagnosis not present

## 2023-07-12 DIAGNOSIS — M5451 Vertebrogenic low back pain: Secondary | ICD-10-CM | POA: Diagnosis not present

## 2023-07-12 DIAGNOSIS — M25562 Pain in left knee: Secondary | ICD-10-CM | POA: Diagnosis not present

## 2023-07-18 DIAGNOSIS — M25562 Pain in left knee: Secondary | ICD-10-CM | POA: Diagnosis not present

## 2023-07-18 DIAGNOSIS — M5451 Vertebrogenic low back pain: Secondary | ICD-10-CM | POA: Diagnosis not present

## 2023-07-20 ENCOUNTER — Other Ambulatory Visit: Payer: Self-pay | Admitting: Emergency Medicine

## 2023-07-20 DIAGNOSIS — F411 Generalized anxiety disorder: Secondary | ICD-10-CM

## 2023-07-20 NOTE — Telephone Encounter (Signed)
 Copied from CRM [id number]. Topic: Clinical - Medication Refill >> Jul 20, 2023  9:01 AM Benton KIDD wrote: Most Recent Primary Care Visit:  Provider: PURCELL EMIL SCHANZ  Department: [facility]  Visit Type: OFFICE VISIT  Date: 05/08/2023  Medication: ***  Has the patient contacted their pharmacy?  (Agent: If no, request that the patient contact the pharmacy for the refill. If patient does not wish to contact the pharmacy document the reason why and proceed with request.) (Agent: If yes, when and what did the pharmacy advise?)  Is this the correct pharmacy for this prescription?  If no, delete pharmacy and type the correct one.  This is the patient's preferred pharmacy:  [id number] [place], [place] - [address] AT [facility] OF [facility] [address] [place] [address] Phone: [phone] Fax: [phone]   Has the prescription been filled recently?   Is the patient out of the medication?   Has the patient been seen for an appointment in the last year OR does the patient have an upcoming appointment?   Can we respond through MyChart?   Agent: Please be advised that Rx refills may take up to 3 business days. We ask that you follow-up with your pharmacy.

## 2023-07-25 DIAGNOSIS — S4992XA Unspecified injury of left shoulder and upper arm, initial encounter: Secondary | ICD-10-CM | POA: Diagnosis not present

## 2023-07-25 DIAGNOSIS — J329 Chronic sinusitis, unspecified: Secondary | ICD-10-CM | POA: Diagnosis not present

## 2023-07-31 DIAGNOSIS — M25512 Pain in left shoulder: Secondary | ICD-10-CM | POA: Diagnosis not present

## 2023-07-31 DIAGNOSIS — M79671 Pain in right foot: Secondary | ICD-10-CM | POA: Diagnosis not present

## 2023-07-31 DIAGNOSIS — M25562 Pain in left knee: Secondary | ICD-10-CM | POA: Diagnosis not present

## 2023-08-06 DIAGNOSIS — W19XXXA Unspecified fall, initial encounter: Secondary | ICD-10-CM | POA: Diagnosis not present

## 2023-08-06 DIAGNOSIS — J101 Influenza due to other identified influenza virus with other respiratory manifestations: Secondary | ICD-10-CM | POA: Diagnosis not present

## 2023-08-06 DIAGNOSIS — S3992XA Unspecified injury of lower back, initial encounter: Secondary | ICD-10-CM | POA: Diagnosis not present

## 2023-08-06 DIAGNOSIS — R509 Fever, unspecified: Secondary | ICD-10-CM | POA: Diagnosis not present

## 2023-08-06 DIAGNOSIS — S8992XA Unspecified injury of left lower leg, initial encounter: Secondary | ICD-10-CM | POA: Diagnosis not present

## 2023-08-06 DIAGNOSIS — R051 Acute cough: Secondary | ICD-10-CM | POA: Diagnosis not present

## 2023-08-06 DIAGNOSIS — S199XXA Unspecified injury of neck, initial encounter: Secondary | ICD-10-CM | POA: Diagnosis not present

## 2023-08-09 DIAGNOSIS — J209 Acute bronchitis, unspecified: Secondary | ICD-10-CM | POA: Diagnosis not present

## 2023-08-13 DIAGNOSIS — J209 Acute bronchitis, unspecified: Secondary | ICD-10-CM | POA: Diagnosis not present

## 2023-08-13 DIAGNOSIS — E041 Nontoxic single thyroid nodule: Secondary | ICD-10-CM | POA: Diagnosis not present

## 2023-08-27 DIAGNOSIS — E041 Nontoxic single thyroid nodule: Secondary | ICD-10-CM | POA: Diagnosis not present

## 2023-08-31 ENCOUNTER — Other Ambulatory Visit: Payer: Self-pay | Admitting: Nurse Practitioner

## 2023-08-31 DIAGNOSIS — M5451 Vertebrogenic low back pain: Secondary | ICD-10-CM | POA: Diagnosis not present

## 2023-08-31 DIAGNOSIS — E041 Nontoxic single thyroid nodule: Secondary | ICD-10-CM

## 2023-08-31 DIAGNOSIS — M25562 Pain in left knee: Secondary | ICD-10-CM | POA: Diagnosis not present

## 2023-09-06 ENCOUNTER — Other Ambulatory Visit: Payer: Self-pay | Admitting: Radiology

## 2023-09-06 ENCOUNTER — Ambulatory Visit: Payer: Self-pay | Admitting: Emergency Medicine

## 2023-09-06 DIAGNOSIS — I10 Essential (primary) hypertension: Secondary | ICD-10-CM

## 2023-09-06 MED ORDER — METOPROLOL TARTRATE 25 MG PO TABS: 25.0000 mg | ORAL_TABLET | Freq: Two times a day (BID) | ORAL | 3 refills | Status: DC

## 2023-09-06 NOTE — Telephone Encounter (Signed)
 Copied from CRM [id number]. Topic: Clinical - Prescription Issue >> Sep 06, 2023 12:48 PM Melissa C wrote: Reason for CRM: patient called in a very upset and displeased manner. It was during lunch hours so I was unable to transfer to [facility] as per request and she didn't lodge a specific complaint however she was very upset. She didn't want to have her [facility] pharmacy call in for her refill too early due to insurance and now that they have called they let her know they haven't heard anything back from they doctor. She has 6 pills left and is afraid. Please advise with patient and with pharmacy. Thank you   This Triage RN attempted to call the patient at this time, no answer, unable to leave voicemail.

## 2023-09-11 DIAGNOSIS — M5451 Vertebrogenic low back pain: Secondary | ICD-10-CM | POA: Diagnosis not present

## 2023-09-11 DIAGNOSIS — M25562 Pain in left knee: Secondary | ICD-10-CM | POA: Diagnosis not present

## 2023-09-12 DIAGNOSIS — J301 Allergic rhinitis due to pollen: Secondary | ICD-10-CM | POA: Diagnosis not present

## 2023-09-12 DIAGNOSIS — J3081 Allergic rhinitis due to animal (cat) (dog) hair and dander: Secondary | ICD-10-CM | POA: Diagnosis not present

## 2023-09-12 DIAGNOSIS — J3089 Other allergic rhinitis: Secondary | ICD-10-CM | POA: Diagnosis not present

## 2023-09-13 ENCOUNTER — Other Ambulatory Visit: Payer: Self-pay

## 2023-09-13 DIAGNOSIS — M25562 Pain in left knee: Secondary | ICD-10-CM | POA: Diagnosis not present

## 2023-09-13 DIAGNOSIS — E041 Nontoxic single thyroid nodule: Secondary | ICD-10-CM

## 2023-09-13 DIAGNOSIS — M5451 Vertebrogenic low back pain: Secondary | ICD-10-CM | POA: Diagnosis not present

## 2023-09-19 DIAGNOSIS — J3081 Allergic rhinitis due to animal (cat) (dog) hair and dander: Secondary | ICD-10-CM | POA: Diagnosis not present

## 2023-09-19 DIAGNOSIS — J301 Allergic rhinitis due to pollen: Secondary | ICD-10-CM | POA: Diagnosis not present

## 2023-09-19 DIAGNOSIS — J3089 Other allergic rhinitis: Secondary | ICD-10-CM | POA: Diagnosis not present

## 2023-09-20 DIAGNOSIS — M25562 Pain in left knee: Secondary | ICD-10-CM | POA: Diagnosis not present

## 2023-09-20 DIAGNOSIS — M5451 Vertebrogenic low back pain: Secondary | ICD-10-CM | POA: Diagnosis not present

## 2023-09-24 ENCOUNTER — Other Ambulatory Visit: Payer: Medicare HMO

## 2023-09-25 DIAGNOSIS — M25562 Pain in left knee: Secondary | ICD-10-CM | POA: Diagnosis not present

## 2023-09-25 DIAGNOSIS — M5451 Vertebrogenic low back pain: Secondary | ICD-10-CM | POA: Diagnosis not present

## 2023-09-26 ENCOUNTER — Ambulatory Visit: Payer: Medicare PPO | Admitting: "Endocrinology

## 2023-09-26 DIAGNOSIS — J3089 Other allergic rhinitis: Secondary | ICD-10-CM | POA: Diagnosis not present

## 2023-09-26 DIAGNOSIS — J3081 Allergic rhinitis due to animal (cat) (dog) hair and dander: Secondary | ICD-10-CM | POA: Diagnosis not present

## 2023-09-26 DIAGNOSIS — J301 Allergic rhinitis due to pollen: Secondary | ICD-10-CM | POA: Diagnosis not present

## 2023-09-27 DIAGNOSIS — M5451 Vertebrogenic low back pain: Secondary | ICD-10-CM | POA: Diagnosis not present

## 2023-09-27 DIAGNOSIS — M25562 Pain in left knee: Secondary | ICD-10-CM | POA: Diagnosis not present

## 2023-10-02 DIAGNOSIS — M5451 Vertebrogenic low back pain: Secondary | ICD-10-CM | POA: Diagnosis not present

## 2023-10-02 DIAGNOSIS — M25562 Pain in left knee: Secondary | ICD-10-CM | POA: Diagnosis not present

## 2023-10-03 DIAGNOSIS — J301 Allergic rhinitis due to pollen: Secondary | ICD-10-CM | POA: Diagnosis not present

## 2023-10-03 DIAGNOSIS — J3081 Allergic rhinitis due to animal (cat) (dog) hair and dander: Secondary | ICD-10-CM | POA: Diagnosis not present

## 2023-10-03 DIAGNOSIS — J3089 Other allergic rhinitis: Secondary | ICD-10-CM | POA: Diagnosis not present

## 2023-10-05 ENCOUNTER — Encounter: Payer: Self-pay | Admitting: Nurse Practitioner

## 2023-10-05 ENCOUNTER — Other Ambulatory Visit: Payer: Medicare HMO

## 2023-10-09 DIAGNOSIS — M25562 Pain in left knee: Secondary | ICD-10-CM | POA: Diagnosis not present

## 2023-10-09 DIAGNOSIS — M5451 Vertebrogenic low back pain: Secondary | ICD-10-CM | POA: Diagnosis not present

## 2023-10-10 DIAGNOSIS — J3089 Other allergic rhinitis: Secondary | ICD-10-CM | POA: Diagnosis not present

## 2023-10-10 DIAGNOSIS — J3081 Allergic rhinitis due to animal (cat) (dog) hair and dander: Secondary | ICD-10-CM | POA: Diagnosis not present

## 2023-10-10 DIAGNOSIS — J301 Allergic rhinitis due to pollen: Secondary | ICD-10-CM | POA: Diagnosis not present

## 2023-10-11 DIAGNOSIS — M5451 Vertebrogenic low back pain: Secondary | ICD-10-CM | POA: Diagnosis not present

## 2023-10-11 DIAGNOSIS — M25562 Pain in left knee: Secondary | ICD-10-CM | POA: Diagnosis not present

## 2023-10-15 ENCOUNTER — Ambulatory Visit
Admission: RE | Admit: 2023-10-15 | Discharge: 2023-10-15 | Disposition: A | Discharge status: Home / Self Care | Source: Ambulatory Visit | Attending: Nurse Practitioner | Admitting: Nurse Practitioner

## 2023-10-15 ENCOUNTER — Other Ambulatory Visit (HOSPITAL_COMMUNITY)
Admission: RE | Admit: 2023-10-15 | Discharge: 2023-10-15 | Disposition: A | Discharge status: Home / Self Care | Source: Ambulatory Visit | Attending: Radiology | Admitting: Radiology

## 2023-10-15 DIAGNOSIS — E041 Nontoxic single thyroid nodule: Secondary | ICD-10-CM | POA: Diagnosis not present

## 2023-10-17 LAB — CYTOLOGY - NON PAP

## 2023-10-23 DIAGNOSIS — M25562 Pain in left knee: Secondary | ICD-10-CM | POA: Diagnosis not present

## 2023-10-23 DIAGNOSIS — M5451 Vertebrogenic low back pain: Secondary | ICD-10-CM | POA: Diagnosis not present

## 2023-10-25 DIAGNOSIS — J3081 Allergic rhinitis due to animal (cat) (dog) hair and dander: Secondary | ICD-10-CM | POA: Diagnosis not present

## 2023-10-25 DIAGNOSIS — J301 Allergic rhinitis due to pollen: Secondary | ICD-10-CM | POA: Diagnosis not present

## 2023-10-25 DIAGNOSIS — J3089 Other allergic rhinitis: Secondary | ICD-10-CM | POA: Diagnosis not present

## 2023-10-25 DIAGNOSIS — M25562 Pain in left knee: Secondary | ICD-10-CM | POA: Diagnosis not present

## 2023-10-25 DIAGNOSIS — M5451 Vertebrogenic low back pain: Secondary | ICD-10-CM | POA: Diagnosis not present

## 2023-11-02 DIAGNOSIS — M25562 Pain in left knee: Secondary | ICD-10-CM | POA: Diagnosis not present

## 2023-11-02 DIAGNOSIS — M5451 Vertebrogenic low back pain: Secondary | ICD-10-CM | POA: Diagnosis not present

## 2023-11-07 DIAGNOSIS — M5451 Vertebrogenic low back pain: Secondary | ICD-10-CM | POA: Diagnosis not present

## 2023-11-07 DIAGNOSIS — M25562 Pain in left knee: Secondary | ICD-10-CM | POA: Diagnosis not present

## 2023-11-14 DIAGNOSIS — J3081 Allergic rhinitis due to animal (cat) (dog) hair and dander: Secondary | ICD-10-CM | POA: Diagnosis not present

## 2023-11-14 DIAGNOSIS — J301 Allergic rhinitis due to pollen: Secondary | ICD-10-CM | POA: Diagnosis not present

## 2023-11-14 DIAGNOSIS — J3089 Other allergic rhinitis: Secondary | ICD-10-CM | POA: Diagnosis not present

## 2023-11-21 DIAGNOSIS — M5451 Vertebrogenic low back pain: Secondary | ICD-10-CM | POA: Diagnosis not present

## 2023-11-21 DIAGNOSIS — M25562 Pain in left knee: Secondary | ICD-10-CM | POA: Diagnosis not present

## 2023-11-28 DIAGNOSIS — J3081 Allergic rhinitis due to animal (cat) (dog) hair and dander: Secondary | ICD-10-CM | POA: Diagnosis not present

## 2023-11-28 DIAGNOSIS — M25562 Pain in left knee: Secondary | ICD-10-CM | POA: Diagnosis not present

## 2023-11-28 DIAGNOSIS — M5451 Vertebrogenic low back pain: Secondary | ICD-10-CM | POA: Diagnosis not present

## 2023-11-28 DIAGNOSIS — J3089 Other allergic rhinitis: Secondary | ICD-10-CM | POA: Diagnosis not present

## 2023-11-28 DIAGNOSIS — J301 Allergic rhinitis due to pollen: Secondary | ICD-10-CM | POA: Diagnosis not present

## 2023-12-05 DIAGNOSIS — M25562 Pain in left knee: Secondary | ICD-10-CM | POA: Diagnosis not present

## 2023-12-05 DIAGNOSIS — M5451 Vertebrogenic low back pain: Secondary | ICD-10-CM | POA: Diagnosis not present

## 2023-12-05 DIAGNOSIS — J301 Allergic rhinitis due to pollen: Secondary | ICD-10-CM | POA: Diagnosis not present

## 2023-12-05 DIAGNOSIS — J3081 Allergic rhinitis due to animal (cat) (dog) hair and dander: Secondary | ICD-10-CM | POA: Diagnosis not present

## 2023-12-05 DIAGNOSIS — J3089 Other allergic rhinitis: Secondary | ICD-10-CM | POA: Diagnosis not present

## 2023-12-19 DIAGNOSIS — J3081 Allergic rhinitis due to animal (cat) (dog) hair and dander: Secondary | ICD-10-CM | POA: Diagnosis not present

## 2023-12-19 DIAGNOSIS — J3089 Other allergic rhinitis: Secondary | ICD-10-CM | POA: Diagnosis not present

## 2023-12-19 DIAGNOSIS — J301 Allergic rhinitis due to pollen: Secondary | ICD-10-CM | POA: Diagnosis not present

## 2023-12-24 DIAGNOSIS — M5451 Vertebrogenic low back pain: Secondary | ICD-10-CM | POA: Diagnosis not present

## 2023-12-24 DIAGNOSIS — M25562 Pain in left knee: Secondary | ICD-10-CM | POA: Diagnosis not present

## 2024-01-02 DIAGNOSIS — M5451 Vertebrogenic low back pain: Secondary | ICD-10-CM | POA: Diagnosis not present

## 2024-01-02 DIAGNOSIS — M25562 Pain in left knee: Secondary | ICD-10-CM | POA: Diagnosis not present

## 2024-01-09 DIAGNOSIS — M25562 Pain in left knee: Secondary | ICD-10-CM | POA: Diagnosis not present

## 2024-01-09 DIAGNOSIS — M5451 Vertebrogenic low back pain: Secondary | ICD-10-CM | POA: Diagnosis not present

## 2024-01-16 DIAGNOSIS — J301 Allergic rhinitis due to pollen: Secondary | ICD-10-CM | POA: Diagnosis not present

## 2024-01-16 DIAGNOSIS — J3089 Other allergic rhinitis: Secondary | ICD-10-CM | POA: Diagnosis not present

## 2024-01-16 DIAGNOSIS — M25562 Pain in left knee: Secondary | ICD-10-CM | POA: Diagnosis not present

## 2024-01-16 DIAGNOSIS — J3081 Allergic rhinitis due to animal (cat) (dog) hair and dander: Secondary | ICD-10-CM | POA: Diagnosis not present

## 2024-01-16 DIAGNOSIS — M5451 Vertebrogenic low back pain: Secondary | ICD-10-CM | POA: Diagnosis not present

## 2024-01-25 DIAGNOSIS — M25562 Pain in left knee: Secondary | ICD-10-CM | POA: Diagnosis not present

## 2024-01-25 DIAGNOSIS — M5451 Vertebrogenic low back pain: Secondary | ICD-10-CM | POA: Diagnosis not present

## 2024-01-30 DIAGNOSIS — J3089 Other allergic rhinitis: Secondary | ICD-10-CM | POA: Diagnosis not present

## 2024-01-30 DIAGNOSIS — J3081 Allergic rhinitis due to animal (cat) (dog) hair and dander: Secondary | ICD-10-CM | POA: Diagnosis not present

## 2024-01-30 DIAGNOSIS — J301 Allergic rhinitis due to pollen: Secondary | ICD-10-CM | POA: Diagnosis not present

## 2024-02-06 DIAGNOSIS — J3081 Allergic rhinitis due to animal (cat) (dog) hair and dander: Secondary | ICD-10-CM | POA: Diagnosis not present

## 2024-02-06 DIAGNOSIS — J3089 Other allergic rhinitis: Secondary | ICD-10-CM | POA: Diagnosis not present

## 2024-02-06 DIAGNOSIS — J301 Allergic rhinitis due to pollen: Secondary | ICD-10-CM | POA: Diagnosis not present

## 2024-02-07 DIAGNOSIS — M25562 Pain in left knee: Secondary | ICD-10-CM | POA: Diagnosis not present

## 2024-02-07 DIAGNOSIS — M5451 Vertebrogenic low back pain: Secondary | ICD-10-CM | POA: Diagnosis not present

## 2024-02-13 DIAGNOSIS — J329 Chronic sinusitis, unspecified: Secondary | ICD-10-CM | POA: Diagnosis not present

## 2024-02-13 DIAGNOSIS — M25562 Pain in left knee: Secondary | ICD-10-CM | POA: Diagnosis not present

## 2024-02-13 DIAGNOSIS — J309 Allergic rhinitis, unspecified: Secondary | ICD-10-CM | POA: Diagnosis not present

## 2024-02-13 DIAGNOSIS — M25561 Pain in right knee: Secondary | ICD-10-CM | POA: Diagnosis not present

## 2024-02-20 DIAGNOSIS — J3089 Other allergic rhinitis: Secondary | ICD-10-CM | POA: Diagnosis not present

## 2024-02-20 DIAGNOSIS — J301 Allergic rhinitis due to pollen: Secondary | ICD-10-CM | POA: Diagnosis not present

## 2024-02-20 DIAGNOSIS — J3081 Allergic rhinitis due to animal (cat) (dog) hair and dander: Secondary | ICD-10-CM | POA: Diagnosis not present

## 2024-02-21 DIAGNOSIS — M25562 Pain in left knee: Secondary | ICD-10-CM | POA: Diagnosis not present

## 2024-02-21 DIAGNOSIS — M5451 Vertebrogenic low back pain: Secondary | ICD-10-CM | POA: Diagnosis not present

## 2024-02-24 ENCOUNTER — Other Ambulatory Visit: Payer: Self-pay | Admitting: Emergency Medicine

## 2024-02-24 DIAGNOSIS — I10 Essential (primary) hypertension: Secondary | ICD-10-CM

## 2024-02-27 DIAGNOSIS — J3081 Allergic rhinitis due to animal (cat) (dog) hair and dander: Secondary | ICD-10-CM | POA: Diagnosis not present

## 2024-02-27 DIAGNOSIS — J309 Allergic rhinitis, unspecified: Secondary | ICD-10-CM | POA: Diagnosis not present

## 2024-02-27 DIAGNOSIS — J3089 Other allergic rhinitis: Secondary | ICD-10-CM | POA: Diagnosis not present

## 2024-02-27 DIAGNOSIS — J301 Allergic rhinitis due to pollen: Secondary | ICD-10-CM | POA: Diagnosis not present

## 2024-02-27 DIAGNOSIS — F418 Other specified anxiety disorders: Secondary | ICD-10-CM | POA: Diagnosis not present

## 2024-03-05 DIAGNOSIS — J301 Allergic rhinitis due to pollen: Secondary | ICD-10-CM | POA: Diagnosis not present

## 2024-03-05 DIAGNOSIS — J3081 Allergic rhinitis due to animal (cat) (dog) hair and dander: Secondary | ICD-10-CM | POA: Diagnosis not present

## 2024-03-05 DIAGNOSIS — J3089 Other allergic rhinitis: Secondary | ICD-10-CM | POA: Diagnosis not present

## 2024-03-06 DIAGNOSIS — M25562 Pain in left knee: Secondary | ICD-10-CM | POA: Diagnosis not present

## 2024-03-06 DIAGNOSIS — M5451 Vertebrogenic low back pain: Secondary | ICD-10-CM | POA: Diagnosis not present

## 2024-03-21 DIAGNOSIS — M5451 Vertebrogenic low back pain: Secondary | ICD-10-CM | POA: Diagnosis not present

## 2024-03-21 DIAGNOSIS — J301 Allergic rhinitis due to pollen: Secondary | ICD-10-CM | POA: Diagnosis not present

## 2024-03-21 DIAGNOSIS — M25562 Pain in left knee: Secondary | ICD-10-CM | POA: Diagnosis not present

## 2024-03-21 DIAGNOSIS — J3089 Other allergic rhinitis: Secondary | ICD-10-CM | POA: Diagnosis not present

## 2024-03-21 DIAGNOSIS — J3081 Allergic rhinitis due to animal (cat) (dog) hair and dander: Secondary | ICD-10-CM | POA: Diagnosis not present

## 2024-04-02 ENCOUNTER — Other Ambulatory Visit: Payer: Self-pay | Admitting: Emergency Medicine

## 2024-04-02 DIAGNOSIS — I10 Essential (primary) hypertension: Secondary | ICD-10-CM

## 2024-04-02 DIAGNOSIS — M40292 Other kyphosis, cervical region: Secondary | ICD-10-CM | POA: Diagnosis not present

## 2024-04-02 DIAGNOSIS — M25512 Pain in left shoulder: Secondary | ICD-10-CM | POA: Diagnosis not present

## 2024-04-21 DIAGNOSIS — J3089 Other allergic rhinitis: Secondary | ICD-10-CM | POA: Diagnosis not present

## 2024-04-21 DIAGNOSIS — J3081 Allergic rhinitis due to animal (cat) (dog) hair and dander: Secondary | ICD-10-CM | POA: Diagnosis not present

## 2024-04-21 DIAGNOSIS — J301 Allergic rhinitis due to pollen: Secondary | ICD-10-CM | POA: Diagnosis not present

## 2024-04-23 DIAGNOSIS — J3081 Allergic rhinitis due to animal (cat) (dog) hair and dander: Secondary | ICD-10-CM | POA: Diagnosis not present

## 2024-04-23 DIAGNOSIS — J3089 Other allergic rhinitis: Secondary | ICD-10-CM | POA: Diagnosis not present

## 2024-04-23 DIAGNOSIS — J301 Allergic rhinitis due to pollen: Secondary | ICD-10-CM | POA: Diagnosis not present

## 2024-04-28 DIAGNOSIS — T148XXA Other injury of unspecified body region, initial encounter: Secondary | ICD-10-CM | POA: Diagnosis not present

## 2024-04-28 DIAGNOSIS — I10 Essential (primary) hypertension: Secondary | ICD-10-CM | POA: Diagnosis not present

## 2024-04-28 DIAGNOSIS — L0390 Cellulitis, unspecified: Secondary | ICD-10-CM | POA: Diagnosis not present

## 2024-04-29 DIAGNOSIS — B356 Tinea cruris: Secondary | ICD-10-CM | POA: Diagnosis not present

## 2024-04-29 DIAGNOSIS — T148XXA Other injury of unspecified body region, initial encounter: Secondary | ICD-10-CM | POA: Diagnosis not present

## 2024-04-30 DIAGNOSIS — M25562 Pain in left knee: Secondary | ICD-10-CM | POA: Diagnosis not present

## 2024-04-30 DIAGNOSIS — M4000 Postural kyphosis, site unspecified: Secondary | ICD-10-CM | POA: Diagnosis not present

## 2024-05-02 DIAGNOSIS — T798XXA Other early complications of trauma, initial encounter: Secondary | ICD-10-CM | POA: Diagnosis not present

## 2024-05-02 DIAGNOSIS — B372 Candidiasis of skin and nail: Secondary | ICD-10-CM | POA: Diagnosis not present

## 2024-05-05 ENCOUNTER — Ambulatory Visit (INDEPENDENT_AMBULATORY_CARE_PROVIDER_SITE_OTHER)

## 2024-05-05 VITALS — Ht 66.0 in | Wt 144.0 lb

## 2024-05-05 DIAGNOSIS — Z0000 Encounter for general adult medical examination without abnormal findings: Secondary | ICD-10-CM

## 2024-05-05 NOTE — Progress Notes (Signed)
 Subjective:   Ruth Walker is a 78 y.o. who presents for a Medicare Wellness preventive visit.  As a reminder, Annual Wellness Visits don't include a physical exam, and some assessments may be limited, especially if this visit is performed virtually. We may recommend an in-person follow-up visit with your provider if needed.  Visit Complete: Virtual I connected with  Ruth Walker on 05/05/24 by a audio enabled telemedicine application and verified that I am speaking with the correct person using two identifiers.  Patient Location: Home  Provider Location: Office/Clinic  I discussed the limitations of evaluation and management by telemedicine. The patient expressed understanding and agreed to proceed.  Vital Signs: Because this visit was a virtual/telehealth visit, some criteria may be missing or patient reported. Any vitals not documented were not able to be obtained and vitals that have been documented are patient reported.  VideoDeclined- This patient declined Librarian, academic. Therefore the visit was completed with audio only.  Persons Participating in Visit: Patient.  AWV Questionnaire: No: Patient Medicare AWV questionnaire was not completed prior to this visit.  Cardiac Risk Factors include: advanced age (>67men, >73 women)     Objective:    Today's Vitals   05/05/24 1427  Weight: 144 lb (65.3 kg)  Height: 5' 6 (1.676 m)   Body mass index is 23.24 kg/m.     05/05/2024    2:27 PM 05/04/2023   10:41 AM 04/29/2023    9:09 AM 11/21/2021    1:13 PM 10/09/2017    1:51 PM 06/04/2017   11:34 AM 06/22/2016    1:35 PM  Advanced Directives  Does Patient Have a Medical Advance Directive? Yes Yes No Yes No  No  Yes   Type of Estate Agent of [place];Living will   Healthcare Power of [place];Living will   Healthcare Power of [place];Living will  Does patient want to make changes to medical advance  directive?       Yes (MAU/Ambulatory/Procedural Areas - Information given)   Copy of Healthcare Power of Attorney in Chart? No - copy requested   No - copy requested   No - copy requested   Would patient like information on creating a medical advance directive?   No - Patient declined  Yes (MAU/Ambulatory/Procedural Areas - Information given)  No - Patient declined       Data saved with a previous flowsheet row definition    Current Medications (verified) Outpatient Encounter Medications as of 05/05/2024  Medication Sig   acetaminophen  (TYLENOL ) 500 MG tablet Take 1,000 mg by mouth every 8 (eight) hours as needed.   ALPRAZolam  (XANAX ) 0.25 MG tablet TAKE 1 TABLET(0.25 MG) BY MOUTH DAILY AS NEEDED FOR ANXIETY   AMBULATORY NON FORMULARY MEDICATION Medication Name: Nitroglycerin 0.125% gel, apply rectally tid for 6-8 weeks   aspirin 81 MG tablet Take 81 mg by mouth daily.     Calcium Carbonate (CALCIUM 600 PO) Take 1 tablet by mouth 2 (two) times daily.   hydrochlorothiazide  (HYDRODIURIL ) 25 MG tablet TAKE 1 TABLET(25 MG) BY MOUTH DAILY   loratadine  (CLARITIN ) 10 MG tablet TAKE 1 TABLET(10 MG) BY MOUTH DAILY   metoprolol  tartrate (LOPRESSOR ) 25 MG tablet TAKE 1 TABLET(25 MG) BY MOUTH TWICE DAILY   oxybutynin  (DITROPAN ) 5 MG tablet Take 1 tablet (5 mg total) by mouth every 8 (eight) hours as needed for bladder spasms.   polyethylene glycol (MIRALAX / GLYCOLAX) packet Take 17 g by mouth at bedtime. Reported  on 07/10/2015   PRESCRIPTION MEDICATION Inject 1 Dose into the skin once a week. Allergy injection [facility]   Probiotic Product (ALIGN) 4 MG CAPS Take 1 capsule by mouth daily.   RESTASIS 0.05 % ophthalmic emulsion Place 1 drop into both eyes 2 (two) times daily.   oxyCODONE -acetaminophen  (PERCOCET) 5-325 MG tablet Take 1 every 6 hours for pain is not relieved by Tylenol  or Motrin alone (Patient not taking: Reported on 05/05/2024)   tiZANidine (ZANAFLEX) 4 MG  tablet 1 tablet Orally Three times a day as needed for muscle spasm for 10 days (Patient not taking: Reported on 05/05/2024)   [DISCONTINUED] Linaclotide  (LINZESS ) 145 MCG CAPS Take 1 capsule by mouth daily.   No facility-administered encounter medications on file as of 05/05/2024.    Allergies (verified) Benadryl [diphenhydramine hcl], Cephalexin, Ciprofloxacin, Mucinex  [guaifenesin  er], Shellfish allergy, Sudafed [pseudoephedrine  hcl], Sulfonamide derivatives, and Aloe   History: Past Medical History:  Diagnosis Date   Allergy    Anal fissure    Anxiety states    Asthma    Gets allergy shots once a week    Cholelithiasis    Depression    Diverticulosis    Environmental allergies    Hemorrhoids    Irritable bowel syndrome (IBS)    Osteoporosis    Skin cancer    Leg   Uterine polyp    Vertebral compression fracture (HCC)    Past Surgical History:  Procedure Laterality Date   BACK SURGERY     CESAREAN SECTION     DILATION AND CURETTAGE OF UTERUS     Endometrial polypectomy     FRACTURE SURGERY     neck (vertebrae)   HEMORRHOID SURGERY     HYSTEROSCOPY     IBS     MOUTH SURGERY     SKIN CANCER EXCISION     Leg    Family History  Problem Relation Age of Onset   Diabetes Mother    Hypertension Mother    Thyroid  disease Mother    Hypertension Father    Heart disease Father    Thyroid  disease Sister    Colon cancer Neg Hx    Social History   Socioeconomic History   Marital status: Married    Spouse name: Richard   Number of children: 2   Years of education: some college   Highest education level: Not on file  Occupational History   Occupation: Retired  Tobacco Use   Smoking status: Never   Smokeless tobacco: Never  Vaping Use   Vaping status: Never Used  Substance and Sexual Activity   Alcohol use: No    Alcohol/week: 0.0 standard drinks of alcohol   Drug use: No   Sexual activity: Not Currently    Birth control/protection: Post-menopausal     Comment: number of sex partners in the last 12 months  1  Other Topics Concern   Not on file  Social History Narrative   Patient gets regular exercise. Cares for her grandchildren.   Lives with husband.   Social Drivers of Corporate Investment Banker Strain: Low Risk  (05/05/2024)   Overall Financial Resource Strain (CARDIA)    Difficulty of Paying Living Expenses: Not hard at all  Food Insecurity: No Food Insecurity (05/05/2024)   Hunger Vital Sign    Worried About Running Out of Food in the Last Year: Never true    Ran Out of Food in the Last Year: Never true  Transportation  Needs: No Transportation Needs (05/05/2024)   PRAPARE - Administrator, Civil Service (Medical): No    Lack of Transportation (Non-Medical): No  Physical Activity: Insufficiently Active (05/05/2024)   Exercise Vital Sign    Days of Exercise per Week: 2 days    Minutes of Exercise per Session: 20 min  Stress: Stress Concern Present (05/05/2024)   [employer] of Occupational Health - Occupational Stress Questionnaire    Feeling of Stress: Very much  Social Connections: Socially Integrated (05/05/2024)   Social Connection and Isolation Panel    Frequency of Communication with Friends and Family: More than three times a week    Frequency of Social Gatherings with Friends and Family: Once a week    Attends Religious Services: 1 to 4 times per year    Active Member of [employer] or Organizations: Yes    Attends Banker Meetings: Never    Marital Status: Married    Tobacco Counseling Counseling given: Not Answered    Clinical Intake:  Pre-visit preparation completed: Yes  Pain : No/denies pain     BMI - recorded: 23.24 Nutritional Status: BMI of 19-24  Normal Nutritional Risks: None Diabetes: No  Lab Results  Component Value Date   HGBA1C 5.9 04/19/2022   HGBA1C 5.9 (H) 10/11/2017   HGBA1C 5.8 (H) 06/19/2016     How often do you need to have someone help you when  you read instructions, pamphlets, or other written materials from your doctor or pharmacy?: 1 - Never  Interpreter Needed?: No  Information entered by :: Verdie Saba, CMA   Activities of Daily Living     05/05/2024    2:21 PM  In your present state of health, do you have any difficulty performing the following activities:  Hearing? 0  Vision? 0  Difficulty concentrating or making decisions? 0  Walking or climbing stairs? 0  Dressing or bathing? 0  Doing errands, shopping? 0  Preparing Food and eating ? N  Using the Toilet? N  In the past six months, have you accidently leaked urine? Y  Comment wears a pantyliner/pad  Do you have problems with loss of bowel control? N  Managing your Medications? N  Managing your Finances? N  Housekeeping or managing your Housekeeping? N    Patient Care Team: Purcell Emil Schanz, MD as PCP - General (Internal Medicine) [facility]  I have updated your Care Teams any recent Medical Services you may have received from other providers in the past year.     Assessment:   This is a routine wellness examination for Ruth Walker.  Hearing/Vision screen Hearing Screening - Comments:: Denies hearing difficulties   Vision Screening - Comments:: Wears rx glasses - up to date with routine eye exams with [facility]   Goals Addressed               This Visit's Progress     Patient Stated (pt-stated)        Patient stated she is very busy helping her sick Spouse who has Cancer and trying to exercise as much as possible when can.       Depression Screen     05/05/2024    2:30 PM 05/08/2023    3:12 PM 05/04/2023   10:44 AM 04/19/2022    1:10 PM 11/21/2021    1:14 PM 11/21/2021    1:11 PM 07/20/2021    2:39 PM  PHQ 2/9 Scores  PHQ - 2 Score  3 0 1 1 0 0 0  PHQ- 9 Score 7  2 2        Fall Risk     05/05/2024    2:22 PM 05/08/2023    3:12 PM 05/04/2023   10:42 AM 04/19/2022    1:09 PM 11/21/2021    1:13 PM  Fall  Risk   Falls in the past year? 0 0 0 0 1  Number falls in past yr: 0 0 0 0 0  Injury with Fall? 0 0 0 0 0  Risk for fall due to : No Fall Risks No Fall Risks No Fall Risks No Fall Risks   Follow up Falls evaluation completed;Falls prevention discussed Falls evaluation completed Falls prevention discussed;Falls evaluation completed  Falls evaluation completed      Data saved with a previous flowsheet row definition    MEDICARE RISK AT HOME:  Medicare Risk at Home Any stairs in or around the home?: Yes If so, are there any without handrails?: No Home free of loose throw rugs in walkways, pet beds, electrical cords, etc?: Yes Adequate lighting in your home to reduce risk of falls?: Yes Life alert?: No Use of a cane, walker or w/c?: No Grab bars in the bathroom?: No Shower chair or bench in shower?: No Elevated toilet seat or a handicapped toilet?: No  TIMED UP AND GO:  Was the test performed?  No  Cognitive Function: Declined/Normal: No cognitive concerns noted by patient or family. Patient alert, oriented, able to answer questions appropriately and recall recent events. No signs of memory loss or confusion.    05/05/2024    2:33 PM  MMSE - Mini Mental State Exam  Not completed: Refused        05/04/2023   10:42 AM 10/09/2017    2:02 PM  6CIT Screen  What Year? 0 points 0 points  What month? 0 points 0 points  What time? 0 points 0 points  Count back from 20 0 points 0 points  Months in reverse 0 points 0 points  Repeat phrase 0 points 0 points  Total Score 0 points 0 points    Immunizations Immunization History  Administered Date(s) Administered   Fluad Quad(high Dose 65+) 03/11/2019   Influenza Split 05/04/2012, 05/29/2018   Influenza,inj,Quad PF,6+ Mos 03/29/2013, 04/19/2014, 05/15/2017   Influenza-Unspecified 04/26/2015, 05/26/2016, 05/09/2021   Pneumococcal Conjugate-13 06/22/2016   Tdap 06/18/2013, 07/20/2021   Zoster, Unspecified 12/20/1996    Screening  Tests Health Maintenance  Topic Date Due   Zoster Vaccines- Shingrix (1 of 2) 12/20/1996   Pneumococcal Vaccine: 50+ Years (2 of 2 - PCV20 or PCV21) 06/22/2017   Mammogram  05/18/2021   Influenza Vaccine  02/08/2024   COVID-19 Vaccine (1 - 2025-26 season) Never done   Medicare Annual Wellness (AWV)  05/05/2025   DTaP/Tdap/Td (3 - Td or Tdap) 07/21/2031   DEXA SCAN  Completed   Hepatitis C Screening  Completed   Meningococcal B Vaccine  Aged Out   Colonoscopy  Discontinued    Health Maintenance Items Addressed:  05/05/2024  Additional Screening:  Vision Screening: Recommended annual ophthalmology exams for early detection of glaucoma and other disorders of the eye. Is the patient up to date with their annual eye exam?  Yes  Who is the provider or what is the name of the office in which the patient attends annual eye exams? [facility]  Dental Screening: Recommended annual dental exams for proper oral hygiene  Community Resource Referral /  Chronic Care Management: CRR required this visit?  No   CCM required this visit?  No   Plan:    I have personally reviewed and noted the following in the patient's chart:   Medical and social history Use of alcohol, tobacco or illicit drugs  Current medications and supplements including opioid prescriptions. Patient is not currently taking opioid prescriptions. Functional ability and status Nutritional status Physical activity Advanced directives List of other physicians Hospitalizations, surgeries, and ER visits in previous 12 months Vitals Screenings to include cognitive, depression, and falls Referrals and appointments  In addition, I have reviewed and discussed with patient certain preventive protocols, quality metrics, and best practice recommendations. A written personalized care plan for preventive services as well as general preventive health recommendations were provided to patient.   Verdie CHRISTELLA Saba, CMA   05/05/2024    After Visit Summary: (Declined) Due to this being a telephonic visit, with patients personalized plan was offered to patient but patient Declined AVS at this time   Notes: Nothing significant to report at this time.

## 2024-05-05 NOTE — Patient Instructions (Addendum)
 Ruth Walker,  Thank you for taking the time for your Medicare Wellness Visit. I appreciate your continued commitment to your health goals. Please review the care plan we discussed, and feel free to reach out if I can assist you further.  Medicare recommends these wellness visits once per year to help you and your care team stay ahead of potential health issues. These visits are designed to focus on prevention, allowing your provider to concentrate on managing your acute and chronic conditions during your regular appointments.  Please note that Annual Wellness Visits do not include a physical exam. Some assessments may be limited, especially if the visit was conducted virtually. If needed, we may recommend a separate in-person follow-up with your provider.  Ongoing Care Seeing your primary care provider every 3 to 6 months helps us  monitor your health and provide consistent, personalized care.   Referrals If a referral was made during today's visit and you haven't received any updates within two weeks, please contact the referred provider directly to check on the status.  Recommended Screenings:  Health Maintenance  Topic Date Due   Zoster (Shingles) Vaccine (1 of 2) 12/20/1996   Pneumococcal Vaccine for age over 31 (2 of 2 - PCV20 or PCV21) 06/22/2017   Breast Cancer Screening  05/18/2021   Flu Shot  02/08/2024   COVID-19 Vaccine (1 - 2025-26 season) Never done   Medicare Annual Wellness Visit  05/05/2025   DTaP/Tdap/Td vaccine (3 - Td or Tdap) 07/21/2031   DEXA scan (bone density measurement)  Completed   Hepatitis C Screening  Completed   Meningitis B Vaccine  Aged Out   Colon Cancer Screening  Discontinued       05/05/2024    2:27 PM  Advanced Directives  Does Patient Have a Medical Advance Directive? Yes  Type of Estate Agent of [place];Living will  Copy of Healthcare Power of Attorney in Chart? No - copy requested   Advance Care Planning is important  because it: Ensures you receive medical care that aligns with your values, goals, and preferences. Provides guidance to your family and loved ones, reducing the emotional burden of decision-making during critical moments.  Vision: Annual vision screenings are recommended for early detection of glaucoma, cataracts, and diabetic retinopathy. These exams can also reveal signs of chronic conditions such as diabetes and high blood pressure.  Dental: Annual dental screenings help detect early signs of oral cancer, gum disease, and other conditions linked to overall health, including heart disease and diabetes.

## 2024-05-21 DIAGNOSIS — J3081 Allergic rhinitis due to animal (cat) (dog) hair and dander: Secondary | ICD-10-CM | POA: Diagnosis not present

## 2024-05-21 DIAGNOSIS — J301 Allergic rhinitis due to pollen: Secondary | ICD-10-CM | POA: Diagnosis not present

## 2024-05-21 DIAGNOSIS — J3089 Other allergic rhinitis: Secondary | ICD-10-CM | POA: Diagnosis not present

## 2024-06-11 DIAGNOSIS — J3089 Other allergic rhinitis: Secondary | ICD-10-CM | POA: Diagnosis not present

## 2024-06-11 DIAGNOSIS — J3081 Allergic rhinitis due to animal (cat) (dog) hair and dander: Secondary | ICD-10-CM | POA: Diagnosis not present

## 2024-06-11 DIAGNOSIS — J301 Allergic rhinitis due to pollen: Secondary | ICD-10-CM | POA: Diagnosis not present

## 2024-07-28 ENCOUNTER — Ambulatory Visit: Payer: Self-pay

## 2024-07-28 NOTE — Telephone Encounter (Signed)
 FYI Only or Action Required?: FYI only for provider: home care.  Patient was last seen in primary care on 05/08/2023 by Purcell Emil Schanz, MD.  Called Nurse Triage reporting Finger Injury.  Symptoms began yesterday.  Interventions attempted: Nothing.  Symptoms are: stable.  Triage Disposition: Home Care  Patient/caregiver understands and will follow disposition?: Yes  Summary: Injury, fish hook sitck   Reason for Triage: Pt calling reports, she stuck herself last night with her spouse fishing hook, she did not bleed or having any other symptoms at present, however just wanted to know what she do in regards to this.  Also wanted to know if she is up to date with tetanus.  Pt requesting to speak to nurse as soon as possible, declined leaving a message.       Reason for Disposition  Small cut (scratch) or abrasion (scrape) is also present  Answer Assessment - Initial Assessment Questions 1. MECHANISM: How did the injury happen?      Was cleaning up and got stuck with fishing hook  2. ONSET: When did the injury happen? (e.g., minutes, hours ago)      Last night  3. LOCATION: What part of the finger is injured? Is the nail damaged?      R ring finger 4. APPEARANCE of the INJURY: What does the injury look like?      No injury, no cut, no bleeding  8. TETANUS: For any breaks in the skin, ask: When was your last tetanus booster?     No breaks, last tetanus was 2023 9. OTHER SYMPTOMS: Do you have any other symptoms?     no  Protocols used: Finger Injury-A-AH

## 2024-07-28 NOTE — Telephone Encounter (Signed)
What is Ruth specific question?

## 2024-07-28 NOTE — Telephone Encounter (Signed)
 Please advise.

## 2024-07-29 NOTE — Telephone Encounter (Signed)
 If she is not updated on tetanus she should get it.

## 2024-07-29 NOTE — Telephone Encounter (Signed)
 It does not look she is up to date on tetanus correct? If not I will call her to get scheduled to have one

## 2024-08-01 NOTE — Telephone Encounter (Signed)
 Patient states that another nurse has already contacted her about her finger and that she did not need a tetanus shot as she is up to date
